# Patient Record
Sex: Male | Born: 1960 | Race: White | Hispanic: No | Marital: Married | Smoking: Never smoker
Health system: Southern US, Community
[De-identification: ages and names within clinical notes are randomized; demographics above are authoritative.]

## PROBLEM LIST (undated history)

## (undated) DIAGNOSIS — R569 Unspecified convulsions: Secondary | ICD-10-CM

## (undated) DIAGNOSIS — N189 Chronic kidney disease, unspecified: Secondary | ICD-10-CM

## (undated) DIAGNOSIS — M109 Gout, unspecified: Secondary | ICD-10-CM

## (undated) DIAGNOSIS — N200 Calculus of kidney: Secondary | ICD-10-CM

## (undated) DIAGNOSIS — E119 Type 2 diabetes mellitus without complications: Secondary | ICD-10-CM

## (undated) DIAGNOSIS — I2510 Atherosclerotic heart disease of native coronary artery without angina pectoris: Secondary | ICD-10-CM

## (undated) DIAGNOSIS — E669 Obesity, unspecified: Secondary | ICD-10-CM

## (undated) DIAGNOSIS — K219 Gastro-esophageal reflux disease without esophagitis: Secondary | ICD-10-CM

## (undated) DIAGNOSIS — K5790 Diverticulosis of intestine, part unspecified, without perforation or abscess without bleeding: Secondary | ICD-10-CM

## (undated) DIAGNOSIS — I219 Acute myocardial infarction, unspecified: Secondary | ICD-10-CM

## (undated) HISTORY — PX: BACK SURGERY: SHX140

## (undated) HISTORY — PX: APPENDECTOMY: SHX54

## (undated) HISTORY — PX: OTHER SURGICAL HISTORY: SHX169

## (undated) HISTORY — PX: PARTIAL COLECTOMY: SHX5273

## (undated) HISTORY — PX: CARDIAC CATHETERIZATION: SHX172

---

## 1999-07-23 ENCOUNTER — Emergency Department (HOSPITAL_COMMUNITY): Admission: EM | Admit: 1999-07-23 | Discharge: 1999-07-23 | Payer: Self-pay | Admitting: Emergency Medicine

## 2000-06-21 ENCOUNTER — Encounter: Payer: Self-pay | Admitting: Neurosurgery

## 2000-06-21 ENCOUNTER — Ambulatory Visit (HOSPITAL_COMMUNITY): Admission: RE | Admit: 2000-06-21 | Discharge: 2000-06-21 | Payer: Self-pay | Admitting: Neurosurgery

## 2000-07-05 ENCOUNTER — Ambulatory Visit (HOSPITAL_COMMUNITY): Admission: RE | Admit: 2000-07-05 | Discharge: 2000-07-05 | Payer: Self-pay | Admitting: Neurosurgery

## 2000-07-05 ENCOUNTER — Encounter: Payer: Self-pay | Admitting: Neurosurgery

## 2000-07-19 ENCOUNTER — Ambulatory Visit (HOSPITAL_COMMUNITY): Admission: RE | Admit: 2000-07-19 | Discharge: 2000-07-19 | Payer: Self-pay | Admitting: Neurosurgery

## 2000-07-19 ENCOUNTER — Encounter: Payer: Self-pay | Admitting: Neurosurgery

## 2000-09-13 ENCOUNTER — Ambulatory Visit (HOSPITAL_COMMUNITY): Admission: RE | Admit: 2000-09-13 | Discharge: 2000-09-14 | Payer: Self-pay | Admitting: Neurosurgery

## 2000-09-13 ENCOUNTER — Encounter: Payer: Self-pay | Admitting: Neurosurgery

## 2001-10-11 HISTORY — PX: COLONOSCOPY: SHX174

## 2001-11-23 ENCOUNTER — Encounter: Payer: Self-pay | Admitting: Emergency Medicine

## 2001-11-23 ENCOUNTER — Emergency Department (HOSPITAL_COMMUNITY): Admission: EM | Admit: 2001-11-23 | Discharge: 2001-11-23 | Payer: Self-pay | Admitting: Emergency Medicine

## 2002-03-21 ENCOUNTER — Encounter: Payer: Self-pay | Admitting: Internal Medicine

## 2002-03-21 ENCOUNTER — Emergency Department (HOSPITAL_COMMUNITY): Admission: EM | Admit: 2002-03-21 | Discharge: 2002-03-21 | Payer: Self-pay | Admitting: Internal Medicine

## 2002-05-16 ENCOUNTER — Ambulatory Visit (HOSPITAL_COMMUNITY): Admission: RE | Admit: 2002-05-16 | Discharge: 2002-05-16 | Payer: Self-pay | Admitting: Internal Medicine

## 2002-05-17 ENCOUNTER — Ambulatory Visit (HOSPITAL_COMMUNITY): Admission: RE | Admit: 2002-05-17 | Discharge: 2002-05-17 | Payer: Self-pay | Admitting: Internal Medicine

## 2002-05-17 ENCOUNTER — Encounter: Payer: Self-pay | Admitting: Internal Medicine

## 2002-05-18 ENCOUNTER — Inpatient Hospital Stay (HOSPITAL_COMMUNITY): Admission: AD | Admit: 2002-05-18 | Discharge: 2002-05-22 | Payer: Self-pay | Admitting: General Surgery

## 2002-05-19 ENCOUNTER — Encounter: Payer: Self-pay | Admitting: General Surgery

## 2002-06-04 ENCOUNTER — Other Ambulatory Visit: Admission: RE | Admit: 2002-06-04 | Discharge: 2002-06-04 | Payer: Self-pay | Admitting: General Surgery

## 2002-06-05 ENCOUNTER — Inpatient Hospital Stay (HOSPITAL_COMMUNITY): Admission: AD | Admit: 2002-06-05 | Discharge: 2002-06-13 | Payer: Self-pay | Admitting: General Surgery

## 2002-12-03 ENCOUNTER — Observation Stay (HOSPITAL_COMMUNITY): Admission: EM | Admit: 2002-12-03 | Discharge: 2002-12-04 | Payer: Self-pay | Admitting: Cardiovascular Disease

## 2002-12-13 ENCOUNTER — Encounter: Payer: Self-pay | Admitting: Internal Medicine

## 2002-12-13 ENCOUNTER — Ambulatory Visit (HOSPITAL_COMMUNITY): Admission: RE | Admit: 2002-12-13 | Discharge: 2002-12-13 | Payer: Self-pay | Admitting: Internal Medicine

## 2003-01-22 ENCOUNTER — Emergency Department (HOSPITAL_COMMUNITY): Admission: EM | Admit: 2003-01-22 | Discharge: 2003-01-22 | Payer: Self-pay | Admitting: Emergency Medicine

## 2003-12-22 ENCOUNTER — Emergency Department (HOSPITAL_COMMUNITY): Admission: EM | Admit: 2003-12-22 | Discharge: 2003-12-22 | Payer: Self-pay | Admitting: Emergency Medicine

## 2004-05-21 ENCOUNTER — Emergency Department (HOSPITAL_COMMUNITY): Admission: EM | Admit: 2004-05-21 | Discharge: 2004-05-22 | Payer: Self-pay | Admitting: Emergency Medicine

## 2004-07-23 ENCOUNTER — Emergency Department (HOSPITAL_COMMUNITY): Admission: EM | Admit: 2004-07-23 | Discharge: 2004-07-23 | Payer: Self-pay | Admitting: Emergency Medicine

## 2004-10-12 ENCOUNTER — Emergency Department (HOSPITAL_COMMUNITY): Admission: EM | Admit: 2004-10-12 | Discharge: 2004-10-12 | Payer: Self-pay | Admitting: Emergency Medicine

## 2005-06-07 ENCOUNTER — Emergency Department (HOSPITAL_COMMUNITY): Admission: EM | Admit: 2005-06-07 | Discharge: 2005-06-07 | Payer: Self-pay | Admitting: Emergency Medicine

## 2005-08-17 ENCOUNTER — Emergency Department (HOSPITAL_COMMUNITY): Admission: EM | Admit: 2005-08-17 | Discharge: 2005-08-17 | Payer: Self-pay | Admitting: Emergency Medicine

## 2005-09-26 ENCOUNTER — Emergency Department (HOSPITAL_COMMUNITY): Admission: EM | Admit: 2005-09-26 | Discharge: 2005-09-26 | Payer: Self-pay | Admitting: Emergency Medicine

## 2005-10-13 ENCOUNTER — Emergency Department (HOSPITAL_COMMUNITY): Admission: EM | Admit: 2005-10-13 | Discharge: 2005-10-13 | Payer: Self-pay | Admitting: Emergency Medicine

## 2005-11-07 ENCOUNTER — Emergency Department (HOSPITAL_COMMUNITY): Admission: EM | Admit: 2005-11-07 | Discharge: 2005-11-07 | Payer: Self-pay | Admitting: Emergency Medicine

## 2006-02-24 ENCOUNTER — Emergency Department (HOSPITAL_COMMUNITY): Admission: EM | Admit: 2006-02-24 | Discharge: 2006-02-24 | Payer: Self-pay | Admitting: Emergency Medicine

## 2006-02-26 ENCOUNTER — Emergency Department (HOSPITAL_COMMUNITY): Admission: EM | Admit: 2006-02-26 | Discharge: 2006-02-26 | Payer: Self-pay | Admitting: Emergency Medicine

## 2006-04-04 ENCOUNTER — Emergency Department (HOSPITAL_COMMUNITY): Admission: EM | Admit: 2006-04-04 | Discharge: 2006-04-04 | Payer: Self-pay | Admitting: Emergency Medicine

## 2006-05-14 ENCOUNTER — Emergency Department (HOSPITAL_COMMUNITY): Admission: EM | Admit: 2006-05-14 | Discharge: 2006-05-14 | Payer: Self-pay | Admitting: Internal Medicine

## 2006-06-15 ENCOUNTER — Emergency Department (HOSPITAL_COMMUNITY): Admission: EM | Admit: 2006-06-15 | Discharge: 2006-06-15 | Payer: Self-pay | Admitting: Emergency Medicine

## 2006-08-07 ENCOUNTER — Emergency Department (HOSPITAL_COMMUNITY): Admission: EM | Admit: 2006-08-07 | Discharge: 2006-08-07 | Payer: Self-pay | Admitting: Emergency Medicine

## 2006-10-12 ENCOUNTER — Emergency Department (HOSPITAL_COMMUNITY): Admission: EM | Admit: 2006-10-12 | Discharge: 2006-10-12 | Payer: Self-pay | Admitting: Emergency Medicine

## 2007-09-22 ENCOUNTER — Emergency Department (HOSPITAL_COMMUNITY): Admission: EM | Admit: 2007-09-22 | Discharge: 2007-09-22 | Payer: Self-pay | Admitting: *Deleted

## 2008-02-11 ENCOUNTER — Emergency Department (HOSPITAL_COMMUNITY): Admission: EM | Admit: 2008-02-11 | Discharge: 2008-02-11 | Payer: Self-pay | Admitting: Emergency Medicine

## 2008-02-16 ENCOUNTER — Emergency Department (HOSPITAL_COMMUNITY): Admission: EM | Admit: 2008-02-16 | Discharge: 2008-02-16 | Payer: Self-pay | Admitting: Emergency Medicine

## 2008-03-12 ENCOUNTER — Emergency Department (HOSPITAL_COMMUNITY): Admission: EM | Admit: 2008-03-12 | Discharge: 2008-03-12 | Payer: Self-pay | Admitting: Emergency Medicine

## 2008-03-13 ENCOUNTER — Emergency Department (HOSPITAL_COMMUNITY): Admission: EM | Admit: 2008-03-13 | Discharge: 2008-03-13 | Payer: Self-pay | Admitting: Emergency Medicine

## 2008-03-13 ENCOUNTER — Encounter: Payer: Self-pay | Admitting: Orthopedic Surgery

## 2008-03-19 ENCOUNTER — Ambulatory Visit: Payer: Self-pay | Admitting: Orthopedic Surgery

## 2008-03-19 DIAGNOSIS — M25579 Pain in unspecified ankle and joints of unspecified foot: Secondary | ICD-10-CM

## 2008-07-14 ENCOUNTER — Emergency Department (HOSPITAL_COMMUNITY): Admission: EM | Admit: 2008-07-14 | Discharge: 2008-07-15 | Payer: Self-pay | Admitting: Emergency Medicine

## 2008-09-05 ENCOUNTER — Emergency Department (HOSPITAL_COMMUNITY): Admission: EM | Admit: 2008-09-05 | Discharge: 2008-09-05 | Payer: Self-pay | Admitting: Emergency Medicine

## 2008-10-13 ENCOUNTER — Emergency Department (HOSPITAL_COMMUNITY): Admission: EM | Admit: 2008-10-13 | Discharge: 2008-10-13 | Payer: Self-pay | Admitting: Emergency Medicine

## 2008-10-23 ENCOUNTER — Emergency Department (HOSPITAL_COMMUNITY): Admission: EM | Admit: 2008-10-23 | Discharge: 2008-10-23 | Payer: Self-pay | Admitting: Emergency Medicine

## 2009-03-10 ENCOUNTER — Emergency Department (HOSPITAL_COMMUNITY): Admission: EM | Admit: 2009-03-10 | Discharge: 2009-03-10 | Payer: Self-pay | Admitting: Emergency Medicine

## 2009-10-25 ENCOUNTER — Emergency Department (HOSPITAL_COMMUNITY): Admission: EM | Admit: 2009-10-25 | Discharge: 2009-10-25 | Payer: Self-pay | Admitting: Emergency Medicine

## 2010-01-15 ENCOUNTER — Emergency Department (HOSPITAL_COMMUNITY): Admission: EM | Admit: 2010-01-15 | Discharge: 2010-01-16 | Payer: Self-pay | Admitting: Emergency Medicine

## 2010-09-30 ENCOUNTER — Emergency Department (HOSPITAL_COMMUNITY)
Admission: EM | Admit: 2010-09-30 | Discharge: 2010-09-30 | Payer: Self-pay | Source: Home / Self Care | Admitting: Emergency Medicine

## 2010-12-17 ENCOUNTER — Emergency Department (HOSPITAL_COMMUNITY)
Admission: EM | Admit: 2010-12-17 | Discharge: 2010-12-18 | Disposition: A | Discharge status: Home / Self Care | Payer: BC Managed Care – PPO | Attending: Emergency Medicine | Admitting: Emergency Medicine

## 2010-12-17 DIAGNOSIS — G40909 Epilepsy, unspecified, not intractable, without status epilepticus: Secondary | ICD-10-CM | POA: Insufficient documentation

## 2010-12-17 DIAGNOSIS — Z9889 Other specified postprocedural states: Secondary | ICD-10-CM | POA: Insufficient documentation

## 2010-12-17 DIAGNOSIS — M109 Gout, unspecified: Secondary | ICD-10-CM | POA: Insufficient documentation

## 2010-12-17 DIAGNOSIS — R109 Unspecified abdominal pain: Secondary | ICD-10-CM | POA: Insufficient documentation

## 2010-12-17 DIAGNOSIS — I252 Old myocardial infarction: Secondary | ICD-10-CM | POA: Insufficient documentation

## 2010-12-17 LAB — COMPREHENSIVE METABOLIC PANEL
AST: 17 U/L (ref 0–37)
Albumin: 3.5 g/dL (ref 3.5–5.2)
Alkaline Phosphatase: 56 U/L (ref 39–117)
CO2: 26 mEq/L (ref 19–32)
Chloride: 106 mEq/L (ref 96–112)
Creatinine, Ser: 0.8 mg/dL (ref 0.4–1.5)
GFR calc non Af Amer: >60 mL/min (ref >60)
Potassium: 3.8 mEq/L (ref 3.5–5.1)
Total Bilirubin: 0.4 mg/dL (ref 0.3–1.2)

## 2010-12-17 LAB — LIPASE, BLOOD: Lipase: 27 U/L (ref 11–59)

## 2011-02-26 NOTE — Discharge Summary (Signed)
John Wallace, John Wallace                         ACCOUNT NO.:  [id number]   MEDICAL RECORD NO.:  [id number]                   PATIENT TYPE:  INP   LOCATION:  A307                                 FACILITY:  [facility]   PHYSICIAN:  Marlane Hatcher, M.D.           DATE OF BIRTH:  04-Apr-1961   DATE OF ADMISSION:  05/18/2002  DATE OF DISCHARGE:  05/22/2002                                 DISCHARGE SUMMARY   DIAGNOSIS:  Acute diverticulitis.   SECONDARY DIAGNOSIS:  Gout.   HISTORY OF PRESENT ILLNESS:  This is a 50 year old white male who was  admitted with left lower quadrant pain that had been recurring on and off  for about a month.  This was his second episode of diverticulitis in  essence.  He had recently undergone a colonoscopy, where he was noted to  have a narrowing of the sigmoid colon with marked inflammatory changes noted  and recurrent diverticulitis was expected.  The possibility of carcinoma of  the colon could not be ruled out.  He was admitted to my service and was  treated with antibiotics and the patient did well.  His abdominal pain  subsided and at the time of discharge four days later he was tolerating p.o.  well.  He was able to void and move his bowels without discomfort.   The plan is for him to let this diverticulitis further acquiesce, reevaluate  this, and plan for elective surgery, as this patient has had several bouts  of acute diverticulitis.   LABORATORY DATA:  The patient was admitted with a white count of 8.9 with an  H&H of 14.6 and 42.9, 77 neutrophils were noted.  His sed rate was 47.  This  was elevated.  His liver function studies and electrolytes were within  normal limits.  His CEA level was not elevated.  It was less than 0.5.   Acute abdominal series showed retained barium and the colon and it was  related to the patient's CT scan and otherwise appeared to be a normal gas  pattern.   HOSPITAL COURSE:  Hospital course essentially unremarkable.   The patient's  severe left lower quadrant pain subsided and he was feeling much better  after the initiation of antibiotics and he was discharged on the fourth  postoperative day with followup arrangements planned to likely involve an  elective sigmoid resection.   DISCHARGE INSTRUCTIONS:  The patient was discharged on a full-liquid and  soft diet.  He was told to refrain from eating nuts, seeds, or popcorn.  He  is to increase his activities as tolerated.  He is permitted to shower and  shampoo.  He is told to do no heavy lifting.   DISCHARGE MEDICATIONS:  1. Cipro 500 mg p.o. b.i.d.  2. Flagyl 500 mg p.o. t.i.d.    FOLLOW UP:  He is to follow up for any medical problems in the interim that  he may have with Dr. Felecia Shelling.  I have made arrangements to see him in followup  and we will plan for an elective sigmoid resection at a later date.                                               Marlane Hatcher, M.D.    WSB/MEDQ  D:  07/02/2002  T:  07/03/2002  Job:  [id number]   cc:   Tesfaye D. Felecia Shelling, M.D.   Gerrit Friends. Rourk, M.D.

## 2011-02-26 NOTE — Op Note (Signed)
NAME:  John Wallace, John Wallace                         ACCOUNT NO.:  [id number]   MEDICAL RECORD NO.:  [id number]                   PATIENT TYPE:  AMB   LOCATION:  DAY                                  FACILITY:  [facility]   PHYSICIAN:  Jennie Bolar. Rourk, M.D.               DATE OF BIRTH:  1961-02-28   DATE OF PROCEDURE:  05/16/2002  DATE OF DISCHARGE:  05/16/2002                                 OPERATIVE REPORT   PROCEDURE:  Attempted colonoscopy.   ENDOSCOPIST:  Gerrit Friends. Rourk, M.D.   INDICATIONS FOR PROCEDURE:  The patient is a 50 year old gentleman with a  recent abdominal pain.  CT suggestive of thickening of the left colon and  inflammatory change consistent with diverticulitis.  This patient's symptoms  have improved greatly over the past several weeks.  He started having  recurrent cramping last evening with the prep.  He has not had any fever or  chills. His abdomen is minimally, diffusely tender to palpation this  morning.  Colonoscopy is now being done to further evaluate his symptoms and  the recent abnormal CT.  This approach has been discussed with the patient.  The potential risks, benefits, and alternatives have been reviewed, and  questions answered.  Please see my dictated consultation note of 03/21/02 for  more information.   DESCRIPTION OF PROCEDURE:  The patient was placed in the left lateral  decubitus position.  O2 saturation, blood pressure, pulse and respirations  were monitored throughout the entire procedure.   CONSCIOUS SEDATION:  Versed 5 mg IV, Demerol 125 mg IV in divided doses.   INSTRUMENT:  Olympus video chip adult colonoscope and subsequently pediatric  colonoscope.   FINDINGS:  Digital rectal exam revealed no abnormalities.   ENDOSCOPIC FINDINGS:  The prep was good.   RECTUM:  Examination of the rectal mucosa including the retroflex view of  the anal verge revealed no abnormalities.   COLON:  The colonic mucosa was surveyed from the rectosigmoid  junction to  approximately 35-40 cm.  The mucosa appeared normal to 35-40 cm.  The  upstream lumen would not open up for me.  It was a bit off center and I  attempted to advance it further to edge the nose of the scope up into the  lumen; however, this produced extreme discomfort for the patient.   Even with minimal attempts at gentle pressure the colon appeared swollen,  although the mucosa itself appeared normal. I tried to advance the scope  numerous times with the adult scope.  I subsequently pulled the adult scope  out and obtained a pediatric colonoscope and advanced it easily to the area  in question at 35-40 cm.  Again, I encountered difficulties with the lumen  which was significantly narrowed and off centered.  I was not able to  advance the scope upstream sufficiently to gain access into this narrowed  segment.  The patient was  quite uncomfortable with every attempt.   I subsequently decided not to make further attempts and withdrew the scope.  The colon and rectal mucosa of the distal 35 cm. Appeared entirely normal.  The patient was returned to the recovery room in stable condition.   IMPRESSION:  1. Normal rectum.  2. Normal appearing colonic mucosa to 35-40 cm.  Spasm versus persistent     swelling and noncompliance of the colon along with patient's discomfort     precluded completion of the examination.   I suspect the patient has either smoldering diverticulitis or adhesive  disease secondary to a prior bout of diverticulitis.   RECOMMENDATIONS:  Will check a CBC and a sedimentation rate today.  Will  decide about pursuing a repeat CT scan versus a BE in the very near future.  Further recommendations to follow.                                               Gerrit Friends. Rourk, M.D.    RMR/MEDQ  D:  05/16/2002  T:  05/19/2002  Job:  [id number]   cc:   Madelin Rear. Sherwood Gambler, M.D.  [address]  [place]  [place] [address]  Fax: [phone]

## 2011-02-26 NOTE — Discharge Summary (Signed)
NAME:  John Wallace, John Wallace                         ACCOUNT NO.:  [id number]   MEDICAL RECORD NO.:  [id number]                   PATIENT TYPE:  INP   LOCATION:  3729                                 FACILITY:  [facility]   PHYSICIAN:  Charlton Haws, M.D. [facility]              DATE OF BIRTH:  10-06-1961   DATE OF ADMISSION:  12/03/2002  DATE OF DISCHARGE:  12/04/2002                           DISCHARGE SUMMARY - REFERRING   HISTORY:  John Wallace is a 50 year old white male who developed the sudden  onset of chest discomfort that he describes as gas in the left upper chest  lasting 5-6 minutes, would then go away, and then come back.  This did not  radiate.  He said this began three days prior to admission, and he had an  episode at [facility] which was severe enough that it convinced him to come to  the emergency room.  He describes this as gas, he will get a rapid heart  beat, and he feels like his discomfort increases with each heartbeat.  He  feels tingling all over, weak, trembly, and has broken out in cold sweats.  He states that it would just ease off on its own.  He was admitted to  [facility] for evaluation.  His history is notable for mild  hyperlipidemia status post partial colectomy for severe diverticular disease  with surgery by Dr. Malvin Johns, to be followed by Dr. Elesa Hacker, and multiple  back surgeries.   LABORATORY DATA:  On transfer, hemoglobin and hematocrit was 15.7 and 45.9,  platelets 218, WBCs 8.4.  Total cholesterol was 122, triglycerides 110, HDL  37, LDL 63.  CRP was low risk at 0.498.   HOSPITAL COURSE:  John Wallace was transferred from [facility] on  12/03/02 to undergo cardiac catheterization to evaluate his chest discomfort.  Cardiac catheterization was performed on 12/04/02 by Dr. Juanda Chance.  This showed  some irregularities in the RCA.  His left ventricular function was 60%.  Dr.  Juanda Chance felt that he did not have any significant coronary artery disease.  He reassured  the patient and felt that he could be discharged home post bed  rest and prescribed with a proton pump inhibitor, aspirin, statin, and  follow up with Dr. Felecia Shelling.  Post bed rest, the patient was ambulating in the  halls without difficulty, catheterization site was intact, thus he was  discharged home.   DISCHARGE DIAGNOSES:  1. Noncardiac chest discomfort.  2. Hyperlipidemia with a low HDL.  3. History as previously of tobacco use.   DISPOSITION:  He is discharged home.  Received new prescriptions for  Protonix 30 mg daily, Zocor 20 mg q.h.s. one month supply with one refill,  and he was asked to begin a baby aspirin 81 mg daily.  He was advised no  lifting, driving, sexual activity, or heavy exertion for two days.  He is to  maintain a low-salt, fat,  and cholesterol diet.  If he has any problems with  his catheterization site, he was asked to call us immediately.  He was  advised no smoking or tobacco products, and the plan is for a 1-2 week  followup appointment with Dr. Felecia Shelling.  At that appointment, consideration  should be given to monitoring improvement of symptoms on a proton pump  inhibitor, but if these  have not helped the symptoms, he ought to consider another GI evaluation.  With the initiation of Zocor, he should have fasting lipids and LFTs in  approximately six weeks.  He was encouraged to begin an exercise program and  weight loss program (i.e. recumbent bicycle with his back problems).     Joellyn Rued, P.A. [facility]                    Charlton Haws, M.D. [facility]    EW/MEDQ  D:  12/04/2002  T:  12/04/2002  Job:  [id number]   cc:   Ninetta Lights D. Felecia Shelling, M.D.  [address]  [place]  [place] [address]  Fax: [phone]   Dr. Diona Browner, [facility]  [address], [address]  [place], [place]  [address]

## 2011-02-26 NOTE — Discharge Summary (Signed)
NAME:  John Wallace, John Wallace                         ACCOUNT NO.:  [id number]   MEDICAL RECORD NO.:  [id number]                   PATIENT TYPE:   LOCATION:                                       FACILITY:  [facility]   PHYSICIAN:  Barbaraann Barthel, M.D.              DATE OF BIRTH:  Feb 01, 1961   DATE OF ADMISSION:  06/05/2002  DATE OF DISCHARGE:  06/13/2002                                 DISCHARGE SUMMARY   DIAGNOSES:  Diverticular disease.   PROCEDURE:  On July 07, 2002 sigmoid resection with side to side  stapled anastomosis.   SECONDARY DIAGNOSES:  Gout.   HISTORY:  This is a 50 year old male who had recurrent episodes of left  lower quadrant pain.  He had two prior episodes of diverticulitis.  He was  admitted after being treated with antibiotics and with plan for a sigmoid  resection is discussed in detail with the patient preoperatively.  This took  place on June 05, 2002 where his parenteral antibiotic and mechanical prep  were accomplished and he was taken to surgery on June 06, 2002 where  sigmoid resection was carried out uneventfully.  His postoperative course  was unremarkable except that he did have an episode postoperatively of gout.  This was treated with the appropriate anti-inflammatory medications without  any problem.  Postoperatively he did well.  His diet and activity were  advanced as tolerated.  At time of discharge he was tolerating p.o. well.  His wound was clean and he was moving his bowels without any problem.  He  was afebrile without any signs of any infection or problems with his  surgery.   LABORATORY DATA:  Pathology report revealed diverticulitis and areas without  any sign of any malignancy.  On June 07, 2002 his white count was 10,000  with an H&H of 13.5 and 39.3.  His metabolic 7 was within normal limits as  was his liver function studies.  On June 09, 2002 his white count was down  to 8.8 with an H&H of 13.5 and 39.7.  As stated, his  hospital course was  completely uneventful and he was discharged on June 13, 2002.   DISCHARGE INSTRUCTIONS:  He was excused from work.  He was discharged on  full liquid to soft diet.  He is permitted to shower.  Tub bath and shampoo  his hair.  He is told to increase his activity as tolerated.  Avoid any  heavy lifting or driving or sexual activity until told to do so.  He is to  take Colace 100 mg p.o. q.d. or b.i.d. or q.o.d.  depending on his bowels.  He is told to resume his gout medicine and  allopurinol 100 mg tablet prescription was given for him to take daily.  Take Tylenol one tablet p.o. q.4h. p.r.n. pain.  Clean his wound with  alcohol t.i.d.  I will follow up with him  perioperatively after which he is  to return to Dr. Felecia Shelling for any medical problems in the future.                                               Barbaraann Barthel, M.D.    WB/MEDQ  D:  08/20/2002  T:  08/21/2002  Job:  [id number]   cc:   Tesfaye D. Felecia Shelling, M.D.  [address]  [place]  [place] [address]  Fax: [phone]

## 2011-02-26 NOTE — Op Note (Signed)
NAME:  John Wallace, John Wallace                         ACCOUNT NO.:  [id number]   MEDICAL RECORD NO.:  [id number]                   PATIENT TYPE:  INP   LOCATION:  A306                                 FACILITY:  [facility]   PHYSICIAN:  Marlane Hatcher, M.D.           DATE OF BIRTH:  [birth year]   DATE OF PROCEDURE:  06/06/2002  DATE OF DISCHARGE:                                 OPERATIVE REPORT   PREOPERATIVE DIAGNOSIS:  Diverticulosis coli.   POSTOPERATIVE DIAGNOSIS:  Diverticulosis coli.   PROCEDURE:  Sigmoid resection.   SURGEON:  Marlane Hatcher, M.D.   INDICATIONS:  This is a 50 year old white male who had recurrent episodes of  diverticular disease.  He had three documented cases of diverticular disease  requiring, on the last episode hospitalization.  Colonoscopy revealed a  segment of stenosed, inflamed, sigmoid colon about 35-40 cm from the anal  verge.  A CT scan revealed a very inflamed segment of colon as well with  some air along the sides of the bowel.  The patient was treated with  antibiotics and 3 weeks later he was taken to surgery after discussing the  need for elective sigmoid resection to him.  We discussed the complications  not limited to but including infection, infection and anastomotic leaks.  Informed consent was obtained.   GROSS OPERATIVE FINDINGS:  The patient had an above the sacral promontory,  at approximately the lower portion of the sigmoid colon an adhered area of  inflamed diverticular disease that was actually adherent to the bladder; and  would like, in time with no doubt in my mind, cause a colovesical fistula.  The rest of the colon was palpated and was normal.  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  No other abnormalities were  encountered preoperatively.   TECHNIQUE:  The patient was placed in the supine position and after the  adequate administration of general anesthesia by endotracheal intubation his  entire  abdomen was prepped with Betadine solution and draped in the usual  manner.  A Foley catheter was aseptically inserted and the G-tube was  placed. A midline incision was carried out from the symphysis pubis to above  the umbilicus.  A self-retaining retractor was placed and the abdomen was  explored with the above findings.  We dissected free the inflamed area of  colon from the bladder. Although this was densely adhered there was no  actual fistula present. This was then dissected free from the bladder.  It  was divided in order to avoid any spillage using a GIA stapler proximally  and distally to the area of inflamed colon which comprised approximately a  segment of 8-10 inches of colon.   This was removed.  We then sent this for frozen section which did not reveal  any evidence of malignancy, but an inflammatory process.  We then irrigated  the abdominal cavity and then performed  a staple side-to-side anastomosis  using the GIA stapler and the PA-55 stapler to close the anastomosis.  The  anastomosis was oversewn with #3-0 GI silk, buttressing the staple line also  with fat pads.  We repaired the mesentery with monofilament absorbable  suture and then irrigated and changed gloves and then checked for hemostasis  and then closed the abdominal cavity with a running #1 Prolene suture.  The  subcu was irrigated.  The skin was approximated with stapling device.  Prior  to  closure all sponge, needle, and instrument counts were found to be correct.  Estimated blood loss was approximately 100-150 cc.  The patient received  2500 of crystalloids intraoperatively.  No drains were placed and the were  no complications.                                               Marlane Hatcher, M.D.    WSB/MEDQ  D:  06/06/2002  T:  06/07/2002  Job:  [id number]   cc:   Tesfaye D. Felecia Shelling, M.D.   Gerrit Friends. Rourk, M.D.

## 2011-02-26 NOTE — H&P (Signed)
NAME:  John Wallace, John Wallace                         ACCOUNT NO.:  [id number]   MEDICAL RECORD NO.:  [id number]                   PATIENT TYPE:  INP   LOCATION:  A306                                 FACILITY:  [facility]   PHYSICIAN:  Marlane Hatcher, M.D.           DATE OF BIRTH:  21-Sep-1961   DATE OF ADMISSION:  06/05/2002  DATE OF DISCHARGE:                                HISTORY & PHYSICAL   HISTORY OF PRESENT ILLNESS:  This is a 50 year old white male who has had  recurrent episodes of left lower quadrant pain.  He has had essentially two  prior episodes prior to his past admission for this problem, and in essence  this composing at least three prior episodes of diverticulitis.  He was  admitted in the early part of August, May 18, 2002, for diverticulitis.  He was treated with antibiotics.  A CT scan revealed an area of localized  sigmoid inflammation.  He was also seen by the gastroenterologist who  performed a colonoscopy on him and referred him to general surgery for  elective resection.  The patient's laboratory work was otherwise within  normal limits and the CEA level was not elevated.   After treating him with antibiotic therapy and his symptoms subsided, we had  plans for an elective sigmoid resection.  He is now admitted to the hospital  to complete his bowel prep and antibiotic prep in preparation for surgery in  the morning.   PHYSICAL EXAMINATION:  GENERAL:  The patient is in no acute distress.  He is  5 feet 8 inches, weighs 224 pounds.  His blood pressure is 129/77, heart  rate 60 per minute, and temperature is 96 degrees.  HEAD:  Normocephalic.  Eyes:  Extraocular movements are intact.  Pupils are  round and react to light and accomodation.  There is no conjunctive pallor  or scleral injection.  Sclera is of normal tincture.  CHEST:  Clear to both anterior and posterior auscultation.  HEART:  Regular in rhythm.  ABDOMEN:  Nontender.  Bowel sounds are normoactive.   He has no femoral or  inguinal hernias.  Rectal examination was not repeated, as this patient has  undergone colonoscopy.  When he was last seen, he had guaiac-negative blood  when I was examining him in my office.  EXTREMITIES:  Grossly within normal limits.   PAST MEDICAL HISTORY:  The patient has problems with recurrent gout from  time to time for which he takes Indocin on a p.r.n. basis.   PAST SURGICAL HISTORY:  He has had an appendectomy in the past.  He has had  a cervical lymph node biopsy for benign disease in the past.  He has had  lumbar disk surgery x5 in the past.   ALLERGIES:  He has no known allergies.   MEDICATIONS:  Apart from the Indocin, he takes no medications on a regular  basis.   REVIEW  OF SYSTEMS:  GI:  The patient has had recurrent left lower quadrant  pain.  He has had three prior episodes of diverticulitis.  He has no past  history of colon cancer, and he has undergone a colonoscopy by Dr. Jena Gauss in  the past.  He has no history of unexplained weight loss and as stated he had  a CT scan which showed an area of localized inflammation in the sigmoid  colon.  GU SYSTEM:  No history of dysuria or hematuria, although at times  when he has had bouts of diverticulitis he has had a feeling of fullness in  the suprapubic area.  No past history of nephrolithiasis.  ENDOCRINE SYSTEM:  The patient is not diabetic.  No history of thyroid disease.  CARDIOVASCULAR  SYSTEM:  Grossly within normal limits.   IMPRESSION:  Recurrent episodes of diverticulitis.   PLAN:  He will undergo a bowel prep and an antibiotic prep, and we will plan  for an elective sigmoid resection during this hospitalization.  This was  discussed in full with both the patient and his wife.  We discussed  complications, not limited to but including bleeding, infection, and the  possibility of an anastomotic leak, and informed consent was obtained.  I  will inform Dr. Jena Gauss and Dr. Felecia Shelling of his  admission.                                                Marlane Hatcher, M.D.    WSB/MEDQ  D:  06/05/2002  T:  06/05/2002  Job:  [id number]   cc:   Tesfaye D. Felecia Shelling, M.D.   Gerrit Friends. Rourk, M.D.

## 2011-02-26 NOTE — Cardiovascular Report (Signed)
NAME:  John Wallace, John Wallace                         ACCOUNT NO.:  [id number]   MEDICAL RECORD NO.:  [id number]                   PATIENT TYPE:  INP   LOCATION:  3729                                 FACILITY:  [facility]   PHYSICIAN:  Charlies Constable, M.D. 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  He has hyperlipidemia.  He was seen for evaluation  of chest pain by Dr. Diona Browner.  He had an episode at [facility] which was more  severe and prompted him to come to [facility] Emergency Room where he was seen  by Dr. Diona Browner.  He was admitted and transferred to [facility].  His  troponins were negative to borderline.   DESCRIPTION OF PROCEDURE:  The procedure was performed via the right femoral  artery using an arterial sheath and 6-French preformed coronary catheters.  A front-wall arterial puncture was performed, and Omnipaque contrast was  used.  The right femoral artery was closed with the Matrix closure device at  the end of the procedure.  The patient tolerated the procedure well and left  the laboratory in satisfactory condition.   RESULTS:  1. Left main coronary artery:  The left main coronary artery was free of     significant disease.  2. Left anterior descending artery:  The left anterior descending artery     gave rise to a septal perforator and a diagonal branch.  These, and the     LAD proper, were free of significant disease.  3. Left circumflex artery:  The left circumflex artery gave rise to an     intermedius branch, 2 marginal branches, an atrial branch, and a     posterolateral branch.  These vessels were free of significant disease.  4. Right coronary artery:  The right coronary artery was a moderate-sized     vessel that gave rise to a right ventricular  branch, a posterior     descending branch, and a small posterolateral branch.  The right coronary     artery was irregular, but there was no major obstruction.  5. Left ventriculogram:  The left ventriculogram, performed in the RAO     projection, showed good wall motion with no areas of hypokinesis.  The     estimated ejection fraction was 60.   IMPRESSION:  Normal coronary angiography, except for minimal irregularities  in the right coronary artery, and normal left ventricular function.    RECOMMENDATIONS:  Reassurance.  In view of these findings, I think the  patient's symptoms are not likely related to ischemic heart disease.  They  may be related to reflux, and we will treat him for this empirically.  I  spoke with  Dr. Felecia Shelling and will ask the patient to follow up with him.  He  should have prophylactic treatment with aspirin and a statin.                                               Charlies Constable, M.D. [facility]    BB/MEDQ  D:  12/04/2002  T:  12/04/2002  Job:  [id number]   cc:   Ninetta Lights D. Felecia Shelling, M.D.  [address]  [place]  [place] [address]  Fax: [phone]   Jonelle Sidle, M.D. [facility]   Cardiac Catheterization Lab

## 2011-02-26 NOTE — Consult Note (Signed)
[facility]  Patient:    John Wallace, John Wallace Visit Number: [id number] MRN: [id number]          Service Type: EMS Location: ED Attending Physician:  Cassell Smiles. Dictated by:   Gardiner Coins, P.A. Proc. Date: 04/17/02 Admit Date:  03/21/2002 Discharge Date: 03/21/2002   CC:         Avon Gully, M.D.   Consultation Report  DATE OF BIRTH:  1961/06/15  REFERRING PHYSICIAN:  Dr. Avon Gully.  CHIEF COMPLAINT:  Diverticulitis.  HISTORY OF PRESENT ILLNESS:  The patient is a very nice 50 year old gentleman we have been asked to see by Dr. Felecia Shelling to follow up on recent diagnosis of diverticulitis.  He was in his usual state of relatively good health until about two months ago, when he developed abdominal bloating that felt as if it were in the epigastric area with eventual cramping in the lower abdomen.  He reports he was seen in the emergency department at [facility] and told he may have some kind of blockage.  He continued to have problems the next day and therefore was seen at urgent care in [facility], was asked to take some enemas, which did not seem to help his symptoms at all, and was started on an antibiotic as well as a stool softener.  Symptoms went away during the time he was on the antibiotic and even the stool softener, but as soon as he stopped the antibiotic, symptoms seemed to return.  No prior history of similar abdominal pain.  No recent antibiotic use prior to being seen at the urgent care.  Pain was bad enough again about three weeks ago that he returned to the emergency department at [facility].  Had a CT scan done, March 21, 2002, that revealed normal CT of the abdomen but in the pelvis, showed numerous diverticula within the sigmoid colon and marked bowel wall thickening associated with the sigmoid colon and pericolonic inflammatory changes within the fat.  No evidence of abscess.  It was felt the  findings were most compatible with sigmoid colonic diverticulitis and repeat imaging was recommended to be certain that there was not an underlying mass of carcinoma of the colon noted to also possibly have similar appearance.  No adenopathy and remainder of the pelvis appeared normal.  Urinalysis done that day revealed specific gravity greater than 1.030 with trace leukocyte esterase and a few bacteria, 7-10 wbcs.  Has noted that occasionally the cramping pain seems to radiate into the penis but has had no problems with hematuria, dysuria, urinary urgency or frequency.  In the emergency department, he was given 1 g of Rocephin IV and was also started on two oral antibiotics, one being Flagyl and other which he cannot remember the name.  During the time he was on the antibiotics, he had increased number of stools and stools became granular, almost sand-like.  Once again, symptoms were well-controlled while on the antibiotics.  After completing the antibiotics, he did have some mucusy stools as well as urgency and cramping but he has actually been symptom-free over the last two to three days.  Appetite has remained normal and weight has been stable.  He has seen some bright red blood per rectum occasionally with straining that he has attributed to hemorrhoids.  Blood is never mixed in with the stool and is just on the tissue.  No melena.  No nausea, vomiting, dysphagia or odynophagia.  Has had problems with heartburn just  about every day over the last two months; had not been having any problems heartburn prior to this.  Notably, before symptoms symptoms, he had been taking Indocin q.d. on a p.r.n. basis for gout and also had been taking generally three Goodys powders per day for sinus headache.  He stopped taking the Goodys powders about two months ago when he began to have stomach problems.  He has never had EGD or total colonoscopy.  Stools are now soft and greenish-brown in color. Had  history of jaundice as a newborn but none since then.  CURRENT MEDICATIONS:  Indocin once daily p.r.n. and Goodys powders p.r.n., none over the last two months except for one powder yesterday for sinus headache.  ALLERGIES:  No known drug allergies.  PAST MEDICAL HISTORY:  Reports he has enjoyed good health except for gout that developed after back surgeries, history of chronic sinus headaches and history of seizures that went away in the third grade.  There is also some question as to whether or not he had spinal meningitis as a newborn.  PAST SURGICAL HISTORY:  Appendectomy and five back surgeries involving six different disks.  FAMILY HISTORY:  Mother and father are both alive and in good health; one sister and three brothers also alive and well.  There is no family history of colorectal cancer, inflammatory bowel disease or chronic gastric illness. 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  SOCIAL HISTORY:  The patient has been married for 18 years.  He has four sons. He works at [employer].  He has dipped snuff for the last 20 years but is not a smoker, will occasionally have a drink in social situations, perhaps one or two per month at this point; before meeting his wife and getting married, he had been drinking heavily, consuming some alcohol at least seven days per week.  REVIEW OF SYSTEMS:  As in HPI.  In addition, no fevers.  Did have some chills after receiving IV fluids in the emergency department.  No syncopal episodes. No chest pains, history of MI or history of heart or valvular disease.  No cough or dyspnea.  PHYSICAL EXAMINATION:  VITAL SIGNS:  Weight 232 pounds.  Height 5 feet 7 inches.  Temperature 97.9, blood pressure 102/80, pulse 66 and regular.  GENERAL:  A very pleasant, cooperative, well-developed and well-nourished Caucasian male in no acute distress, answers questions quickly and appropriately.  He is accompanied today by his wife  Betti Cruz.   HEENT:  Atraumatic, normocephalic.  Sclerae nonicteric.  Conjunctivae clear. Oropharynx is pink and moist.  NECK:  Supple.  No masses.  No adenopathy.  Trachea midline.  LUNGS:  Clear to auscultation bilaterally.  CARDIOVASCULAR:  S1 and S2.  Regular rate and rhythm without murmur.  ABDOMEN:  Positive normoactive bowel sounds.  Soft, nontender and nondistended.  No masses or organomegaly.  No guarding.  RECTAL:  Adequate sphincter tone.  No masses in the anal canal or rectal vault.  Scant amount of formed yellowish-brown stool that is guaiac-negative.  EXTREMITIES:  No lower extremity edema.  SKIN:  Warm and dry.  No jaundice.  Normal hair distribution.  Multiple tattoos on upper extremities (reports tattoos were all done by reputable folks with new needles).  IMPRESSION: 1. The patient is a very nice 50 year old gentleman with what appears to be a    recent episode of diverticulitis.  On CT scan from March 21, 2002, he had    marked bowel wall thickening  in the sigmoid colon as well as pericolonic    inflammatory changes within the fat as well as numerous diverticula of the    sigmoid colon.  Symptoms improved on antibiotic therapy, although he had    some return of crampy-type abdominal pain after completing antibiotics.  At    this point, it is back to baseline over the last two to three days.  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  Because of CT    findings, would like to further evaluate colon via colonoscopy to check on    the wall thickening as well as to look for sources of intermittent    hematochezia.  Hematochezia by history sounds as if it is related to benign    anorectal disease and would like to check on possibility of polyps or    alternative source of blood loss.  Total colonoscopy was discussed with the    patient and his wife including potential risks, benefits and alternatives    and he is agreeable to proceeding with  procedure. 2. Typical reflux symptoms over the last two months.  Can certainly try him on    proton pump inhibitor and if he does not respond to therapy, consider    esophagogastroduodenoscopy. 3. History of chronic sinus headaches.  Had been taking three Goodys powders    per day to control headaches prior to diverticulitis.  RECOMMENDATIONS: 1. Total colonoscopy at [facility] in approximately three weeks. 2. Begin Protonix 40 mg p.o. q.d. 30 minutes before a meal; he was given    28 sample capsules.  If Protonix controls symptoms, he is to contact our    office and we can give him a prescription. 3. Given GERD information sheet, total colonoscopy pamphlet and diverticular    disease pamphlet. 4. Further workup of chronic sinus headaches per Dr. Felecia Shelling.  We would like to thank Dr. Felecia Shelling for allowing [place] to participate in the care of this very nice gentleman. Dictated by:   Gardiner Coins, P.A. Attending Physician:  Cassell Smiles DD:  04/17/02 TD:  04/20/02 Job: [id number]

## 2011-07-07 LAB — CBC
HCT: 43.1
Hemoglobin: 15.1
MCHC: 34.9
MCV: 85
RDW: 12.8

## 2011-07-07 LAB — DIFFERENTIAL
Basophils Absolute: 0
Eosinophils Absolute: 0.1
Lymphocytes Relative: 24
Lymphs Abs: 1.5
Neutrophils Relative %: 66

## 2011-07-07 LAB — URINALYSIS, ROUTINE W REFLEX MICROSCOPIC
Glucose, UA: NEGATIVE
Leukocytes, UA: NEGATIVE
Nitrite: NEGATIVE
Protein, ur: NEGATIVE
pH: 5

## 2011-07-07 LAB — BASIC METABOLIC PANEL
BUN: 13
Calcium: 8.5
GFR calc non Af Amer: >60
Glucose, Bld: 126 — ABNORMAL HIGH
Sodium: 139

## 2011-07-07 LAB — URINE MICROSCOPIC-ADD ON

## 2011-07-12 LAB — BASIC METABOLIC PANEL
BUN: 13
Creatinine, Ser: 0.84

## 2011-07-12 LAB — DIFFERENTIAL
Basophils Absolute: 0.1
Eosinophils Relative: 1
Lymphocytes Relative: 23
Lymphs Abs: 2.1
Neutro Abs: 6.2
Neutrophils Relative %: 67

## 2011-07-12 LAB — CBC
MCHC: 34
Platelets: 263
RDW: 12.7

## 2011-07-12 LAB — URIC ACID: Uric Acid, Serum: 8 — ABNORMAL HIGH

## 2011-10-07 ENCOUNTER — Emergency Department (HOSPITAL_COMMUNITY)
Admission: EM | Admit: 2011-10-07 | Discharge: 2011-10-07 | Disposition: A | Discharge status: Home / Self Care | Payer: BC Managed Care – PPO | Attending: Emergency Medicine | Admitting: Emergency Medicine

## 2011-10-07 DIAGNOSIS — M25579 Pain in unspecified ankle and joints of unspecified foot: Secondary | ICD-10-CM | POA: Insufficient documentation

## 2011-10-07 DIAGNOSIS — B9789 Other viral agents as the cause of diseases classified elsewhere: Secondary | ICD-10-CM | POA: Insufficient documentation

## 2011-10-07 DIAGNOSIS — R059 Cough, unspecified: Secondary | ICD-10-CM | POA: Insufficient documentation

## 2011-10-07 DIAGNOSIS — R05 Cough: Secondary | ICD-10-CM | POA: Insufficient documentation

## 2011-10-07 DIAGNOSIS — B349 Viral infection, unspecified: Secondary | ICD-10-CM

## 2011-10-07 DIAGNOSIS — R6883 Chills (without fever): Secondary | ICD-10-CM | POA: Insufficient documentation

## 2011-10-07 DIAGNOSIS — IMO0001 Reserved for inherently not codable concepts without codable children: Secondary | ICD-10-CM | POA: Insufficient documentation

## 2011-10-07 DIAGNOSIS — R61 Generalized hyperhidrosis: Secondary | ICD-10-CM | POA: Insufficient documentation

## 2011-10-07 NOTE — ED Provider Notes (Signed)
History     CSN: [id number]  Arrival date & time 10/07/11  1409   First MD Initiated Contact with Patient 10/07/11 1600      Chief Complaint  Patient presents with  . Ankle Pain     Patient is a 50 y.o. male presenting with cough. The history is provided by the patient.  Cough This is a new problem. The current episode started yesterday. The problem occurs hourly. The problem has not changed since onset.The cough is productive of sputum. The maximum temperature recorded prior to his arrival was 100 to 100.9 F. Associated symptoms include chills, sweats and myalgias. Pertinent negatives include no shortness of breath and no wheezing.  pt reports cough/congestion/myaglias/fever since yesterday No SOB reported No CP reported  Also reports bilateral ankle pain for weeks, no trauma Also reports "knot" to right lateral knee, no trauma/redness  PMH = denies  Past Surgical History  Procedure Date  . Back surgery   . Cardiac catheterization     No family history on file.  History  Substance Use Topics  . Smoking status: Never Smoker   . Smokeless tobacco: Not on file  . Alcohol Use: No      Review of Systems  Constitutional: Positive for chills.  Respiratory: Positive for cough. Negative for shortness of breath and wheezing.   Musculoskeletal: Positive for myalgias.    Allergies  Review of patient's allergies indicates no known allergies.  Home Medications   Current Outpatient Rx  Name Route Sig Dispense Refill  . INDOCIN PO Oral Take 1 tablet by mouth daily as needed. For gout flares       BP 156/83  Pulse 76  Temp(Src) 98.3 F (36.8 C) (Oral)  Resp 18  Ht 5\' 8"  (1.727 m)  SpO2 98%  Physical Exam  CONSTITUTIONAL: Well developed/well nourished HEAD AND FACE: Normocephalic/atraumatic EYES: EOMI/PERRL ENMT: Mucous membranes moist, uvulai midline, pharynx normal NECK: supple no meningeal signs CV: S1/S2 noted, no murmurs/rubs/gallops noted LUNGS: Lungs  are clear to auscultation bilaterally, no apparent distress ABDOMEN: soft, nontender, no rebound or guarding GU:no cva tenderness NEURO: Pt is awake/alert, moves all extremitiesx4 EXTREMITIES: pulses normal, full ROM No edema No erythema noted Ankles are symmetric without significant edema Full ROM of all joints in lower extremities Small mobile nodule to right lateral knee, no erythema/tenderness noted SKIN: warm, color normal PSYCH: no abnormalities of mood noted   ED Course  Procedures     1. Viral syndrome       MDM  Nursing notes reviewed and considered in documentation No signs of cellulitis/septic joint/DVT         Joya Gaskins, MD 10/07/11 1645

## 2011-10-07 NOTE — ED Notes (Signed)
Pt c/o bilateral ankle pain off and on x 3 weeks.  Also c/o movable knot on lateral aspect of R knee.  Reports this am has had episodes of feeling  hot and cold.

## 2012-03-13 ENCOUNTER — Emergency Department (HOSPITAL_COMMUNITY): Payer: BC Managed Care – PPO

## 2012-03-13 ENCOUNTER — Other Ambulatory Visit: Payer: Self-pay

## 2012-03-13 ENCOUNTER — Encounter (HOSPITAL_COMMUNITY): Payer: Self-pay | Admitting: *Deleted

## 2012-03-13 ENCOUNTER — Emergency Department (HOSPITAL_COMMUNITY)
Admission: EM | Admit: 2012-03-13 | Discharge: 2012-03-14 | Disposition: A | Discharge status: Home / Self Care | Payer: BC Managed Care – PPO | Attending: Emergency Medicine | Admitting: Emergency Medicine

## 2012-03-13 DIAGNOSIS — R079 Chest pain, unspecified: Secondary | ICD-10-CM | POA: Insufficient documentation

## 2012-03-13 DIAGNOSIS — E669 Obesity, unspecified: Secondary | ICD-10-CM

## 2012-03-13 DIAGNOSIS — R1013 Epigastric pain: Secondary | ICD-10-CM

## 2012-03-13 DIAGNOSIS — Z6834 Body mass index (BMI) 34.0-34.9, adult: Secondary | ICD-10-CM | POA: Insufficient documentation

## 2012-03-13 DIAGNOSIS — K3189 Other diseases of stomach and duodenum: Secondary | ICD-10-CM

## 2012-03-13 HISTORY — DX: Gout, unspecified: M10.9

## 2012-03-13 HISTORY — DX: Obesity, unspecified: E66.9

## 2012-03-13 HISTORY — DX: Diverticulosis of intestine, part unspecified, without perforation or abscess without bleeding: K57.90

## 2012-03-13 LAB — COMPREHENSIVE METABOLIC PANEL
AST: 17 U/L (ref 0–37)
Albumin: 3.8 g/dL (ref 3.5–5.2)
Alkaline Phosphatase: 68 U/L (ref 39–117)
BUN: 12 mg/dL (ref 6–23)
Chloride: 100 mEq/L (ref 96–112)
Potassium: 3.9 mEq/L (ref 3.5–5.1)
Total Bilirubin: 0.1 mg/dL — ABNORMAL LOW (ref 0.3–1.2)

## 2012-03-13 LAB — DIFFERENTIAL
Basophils Absolute: 0.1 10*3/uL (ref 0.0–0.1)
Lymphocytes Relative: 24 % (ref 12–46)
Lymphs Abs: 2.4 10*3/uL (ref 0.7–4.0)
Neutro Abs: 6.5 10*3/uL (ref 1.7–7.7)
Neutrophils Relative %: 67 % (ref 43–77)

## 2012-03-13 LAB — PROTIME-INR
INR: 0.95 (ref 0.00–1.49)
Prothrombin Time: 12.9 seconds (ref 11.6–15.2)

## 2012-03-13 LAB — CBC
HCT: 43.8 % (ref 39.0–52.0)
Hemoglobin: 14.6 g/dL (ref 13.0–17.0)
MCHC: 33.3 g/dL (ref 30.0–36.0)
MCV: 86.1 fL (ref 78.0–100.0)
RDW: 13.2 % (ref 11.5–15.5)

## 2012-03-13 LAB — CARDIAC PANEL(CRET KIN+CKTOT+MB+TROPI)
CK, MB: 4.7 ng/mL — ABNORMAL HIGH (ref 0.3–4.0)
Total CK: 185 U/L (ref 7–232)
Troponin I: <0.3 ng/mL (ref <0.30)

## 2012-03-13 MED ORDER — ASPIRIN 81 MG PO CHEW
324.0000 mg | CHEWABLE_TABLET | Freq: Once | ORAL | Status: AC
  Administered 2012-03-13: 324 mg via ORAL
  Filled 2012-03-13: qty 4

## 2012-03-13 MED ORDER — ONDANSETRON HCL 4 MG/2ML IJ SOLN: 4.0000 mg | Freq: Three times a day (TID) | INTRAMUSCULAR | Status: DC | PRN

## 2012-03-13 MED ORDER — SODIUM CHLORIDE 0.9 % IV SOLN: INTRAVENOUS | Status: DC

## 2012-03-13 NOTE — Consult Note (Signed)
Emergency room consult:  Date of consult:  03/13/12  Physician requesting the consult:  Glynn Octave M.D.  Consulting physician: Vania Rea M.D.  PCP:   Colette Ribas, MD, MD    Reason for consult: Episodic Chest pain since yesterday  ASSESSMENT: Non-cardiac chest pain Obesity History of gout Dyspepsia Only known cardiac risk factors: Gender, age & obesity Normal cardiac Normal EKG No indication for admission to acute care  Recommendations: Discharged home with early cardiac followup, [facility] Collected blood for fasting lipid panel prior to discharge, since patient has not eaten for the past 10 hours Description for aspirin and Protonix daily, until seen by cardiology Return to the emergency room for worsening or recurrence of chest pain   HPI: John Wallace is an 51 y.o. male.    Denies any chronic medical problems except episodic gout; and attack route every 6 months, managed with when necessary indomethacin. History of cardiac catheterization 9 years ago by the [facility] reported as normal. No other cardiac history no history of chest pains climbing stairs or with activity times flights of stairs every day without shortness of breath or chest pain. Has otherwise been in good state of health until while visiting a relative at [facility] yesterday developed in the precordial chest discomfort which felt like "gas"because he had not eaten, resolved spontaneously within 10 minutes. She had a recurrence of a similar pain today before midday and decided he would try to help in eating a hot dog with some most, and that seemed to aggravate the pain made it very severe 9/10 radiating to his left shoulder. He drew himself to the emergency room where his seat 4 baby aspirin the pain resolved. He did not receive nitroglycerin. Is no associated nausea vomiting dizziness or diaphoresis. EKG was done which showed normal sinus rhythm, rate in the low 60s; his  cardiac markers are completely normal.   Rewiew of Systems:  The patient denies anorexia, fever, weight loss,, vision loss, decreased hearing, hoarseness,  syncope, dyspnea on exertion, peripheral edema, balance deficits, hemoptysis, abdominal pain, melena, hematochezia, severe indigestion/heartburn, hematuria, incontinence, genital sores, muscle weakness, suspicious skin lesions, transient blindness, difficulty walking, depression, unusual weight change, abnormal bleeding, enlarged lymph nodes, angioedema, and breast masses.   Past Medical History  Diagnosis Date  . Gout   . Obesity   . Diverticulosis     Past Surgical History  Procedure Date  . Back surgery   . Cardiac catheterization   . Partial colectomy     Dr Malvin Johns    Medications:  denies use of any prescribed or over-the-counter medications currently HOME MEDS: Prior to Admission medications   Not on File     Allergies:  No Known Allergies  Social History:   reports that he has never smoked. He does not have any smokeless tobacco history on file. He reports that he does not drink alcohol or use illicit drugs.  Family History: History reviewed. No pertinent family history. No family history of cardiac disease    Physical Exam: Filed Vitals:   03/13/12 2012  BP: 158/92  Pulse: 65  Temp: 98 F (36.7 C)  TempSrc: Oral  Resp: 20  Height: 5\' 7"  (1.702 m)  Weight: 99.791 kg (220 lb)  SpO2: 100%   Blood pressure 158/92, pulse 65, temperature 98 F (36.7 C), temperature source Oral, resp. rate 20, height 5\' 7"  (1.702 m), weight 99.791 kg (220 lb), SpO2 100.00%.  GEN:  Pleasant obese Caucasian gentleman lying in  the stretcher indistress; cooperative with exam PSYCH:  alert and oriented x4; does not appear anxious or depressed; affect is appropriate. HEENT: Mucous membranes pink and anicteric; PERRLA; EOM intact;  thick neck no cervical lymphadenopathy nor thyromegaly or carotid bruit; no JVD; Breasts:: Not  examined CHEST WALL: No tenderness CHEST: Normal respiration, clear to auscultation bilaterally HEART: Regular rate and rhythm; no murmurs rubs or gallops BACK: No kyphosis or scoliosis; no CVA tenderness ABDOMEN: Obese, soft non-tender; no masses, no organomegaly, normal abdominal bowel sounds;  no intertriginous candida. Rectal Exam: Not done EXTREMITIES: No bone or joint deformity; ano edema; no ulcerations. Genitalia: not examined PULSES: 2+ and symmetric SKIN: Normal hydration no rash or ulceration CNS: Cranial nerves 2-12 grossly intact no focal lateralizing neurologic deficit   Labs & Imaging Results for orders placed during the hospital encounter of 03/13/12 (from the past 48 hour(s))  CBC     Status: Normal   Collection Time   03/13/12  8:49 PM      Component Value Range Comment   WBC 9.8  4.0 - 10.5 (K/uL)    RBC 5.09  4.22 - 5.81 (MIL/uL)    Hemoglobin 14.6  13.0 - 17.0 (g/dL)    HCT 45.4  09.8 - 11.9 (%)    MCV 86.1  78.0 - 100.0 (fL)    MCH 28.7  26.0 - 34.0 (pg)    MCHC 33.3  30.0 - 36.0 (g/dL)    RDW 14.7  82.9 - 56.2 (%)    Platelets 245  150 - 400 (K/uL)   DIFFERENTIAL     Status: Normal   Collection Time   03/13/12  8:49 PM      Component Value Range Comment   Neutrophils Relative 67  43 - 77 (%)    Neutro Abs 6.5  1.7 - 7.7 (K/uL)    Lymphocytes Relative 24  12 - 46 (%)    Lymphs Abs 2.4  0.7 - 4.0 (K/uL)    Monocytes Relative 7  3 - 12 (%)    Monocytes Absolute 0.7  0.1 - 1.0 (K/uL)    Eosinophils Relative 1  0 - 5 (%)    Eosinophils Absolute 0.1  0.0 - 0.7 (K/uL)    Basophils Relative 1  0 - 1 (%)    Basophils Absolute 0.1  0.0 - 0.1 (K/uL)   COMPREHENSIVE METABOLIC PANEL     Status: Abnormal   Collection Time   03/13/12  8:49 PM      Component Value Range Comment   Sodium 137  135 - 145 (mEq/L)    Potassium 3.9  3.5 - 5.1 (mEq/L)    Chloride 100  96 - 112 (mEq/L)    CO2 28  19 - 32 (mEq/L)    Glucose, Bld 107 (*) 70 - 99 (mg/dL)    BUN 12  6 - 23  (mg/dL)    Creatinine, Ser 1.30  0.50 - 1.35 (mg/dL)    Calcium 9.6  8.4 - 10.5 (mg/dL)    Total Protein 7.8  6.0 - 8.3 (g/dL)    Albumin 3.8  3.5 - 5.2 (g/dL)    AST 17  0 - 37 (U/L)    ALT 20  0 - 53 (U/L)    Alkaline Phosphatase 68  39 - 117 (U/L)    Total Bilirubin 0.1 (*) 0.3 - 1.2 (mg/dL)    GFR calc non Af Amer >90  >90 (mL/min)    GFR calc Af Amer >90  >  90 (mL/min)   PROTIME-INR     Status: Normal   Collection Time   03/13/12  8:49 PM      Component Value Range Comment   Prothrombin Time 12.9  11.6 - 15.2 (seconds)    INR 0.95  0.00 - 1.49    CARDIAC PANEL(CRET KIN+CKTOT+MB+TROPI)     Status: Abnormal   Collection Time   03/13/12  8:49 PM      Component Value Range Comment   Total CK 185  7 - 232 (U/L)    CK, MB 4.7 (*) 0.3 - 4.0 (ng/mL)    Troponin I <0.30  <0.30 (ng/mL)    Relative Index 2.5  0.0 - 2.5     Dg Chest 2 View  03/13/2012  *RADIOLOGY REPORT*  Clinical Data: Chest pain.  CHEST - 2 VIEW  Comparison: None  Findings: The cardiac silhouette, mediastinal and hilar contours are within normal limits.  The lungs are clear of acute process. Probable mild bronchitic changes.  No pleural effusion.  The bony thorax is intact.  Degenerative changes noted in the mid and lower thoracic spine.  IMPRESSION: No acute cardiopulmonary findings.  Original Report Authenticated By: P. Loralie Champagne, M.D.   EKG shows a normal sinus rhythm, no ST segment changes   Steffie Waggoner 03/13/2012, 11:48 PM

## 2012-03-13 NOTE — ED Provider Notes (Signed)
History   This chart was scribed for Glynn Octave, MD by Shari Heritage. The patient was seen in room APA08/APA08. Patient's care was started at 2007.     CSN: [id number]  Arrival date & time 03/13/12  2007   First MD Initiated Contact with Patient 03/13/12 2040      Chief Complaint  Patient presents with  . Chest Pain    (Consider location/radiation/quality/duration/timing/severity/associated sxs/prior treatment) HPI John Wallace is a 51 y.o. male who presents to the Emergency Department complaining of moderate, waxing and waning chest pain onset several hours ago. Patient describes pain as throbbing and it radiates to left shoulder and down left arm. Pain says that he was visiting his aunt after her surgery at [facility] when the pain began. He initially thought it was gas. Patient said he first began to feel twinges in his chest 3 days ago, but today, he began experiencing waxing and waning chest pain every 15-20 minutes since earlier today. Patient has also been experiencing disorientation and cold sweats. Patient took four Aspirin today. Patient with h/o of cardiac catheterization (9 years ago) and gout. Patient has never smoked.   Cardiologist - Corinda Gubler   History reviewed. No pertinent past medical history.  Past Surgical History  Procedure Date  . Back surgery   . Cardiac catheterization     History reviewed. No pertinent family history.  History  Substance Use Topics  . Smoking status: Never Smoker   . Smokeless tobacco: Not on file  . Alcohol Use: No      Review of Systems A complete 10 system review of systems was obtained and all systems are negative except as noted in the HPI and PMH.   Allergies  Review of patient's allergies indicates no known allergies.  Home Medications   No current outpatient prescriptions on file.  BP 158/92  Pulse 65  Temp(Src) 98 F (36.7 C) (Oral)  Resp 20  Ht 5\' 7"  (1.702 m)  Wt 220 lb (99.791 kg)  BMI 34.46 kg/m2   SpO2 100%  Physical Exam  Constitutional: He is oriented to person, place, and time. He appears well-developed.  HENT:  Head: Normocephalic and atraumatic.  Eyes: Conjunctivae and EOM are normal. No scleral icterus.  Neck: Neck supple. No thyromegaly present.  Cardiovascular: Normal rate and regular rhythm.   No murmur heard. Pulmonary/Chest: Effort normal and breath sounds normal. No stridor. He has no wheezes. He has no rales.       Chest pain is not reproducible.  Abdominal: Soft.  Musculoskeletal: Normal range of motion. He exhibits no edema.       Equal grip strengths.  Lymphadenopathy:    He has no cervical adenopathy.  Neurological: He is oriented to person, place, and time. Coordination normal.  Skin: No rash noted. No erythema.  Psychiatric: He has a normal mood and affect. His behavior is normal.    ED Course  Procedures (including critical care time) DIAGNOSTIC STUDIES: Oxygen Saturation is 100% on room air, normal by my interpretation.    COORDINATION OF CARE: 8:48pm - Patient informed of current plan for treatment and evaluation and agrees with plan at this time. Will order blood tests.  9:55pm - Consult with hospitalist. Patient to be admitted. Labs Reviewed  COMPREHENSIVE METABOLIC PANEL - Abnormal; Notable for the following:    Glucose, Bld 107 (*)    Total Bilirubin 0.1 (*)    All other components within normal limits  CARDIAC PANEL(CRET KIN+CKTOT+MB+TROPI) - Abnormal; Notable  for the following:    CK, MB 4.7 (*)    All other components within normal limits  CBC  DIFFERENTIAL  PROTIME-INR   Dg Chest 2 View  03/13/2012  *RADIOLOGY REPORT*  Clinical Data: Chest pain.  CHEST - 2 VIEW  Comparison: None  Findings: The cardiac silhouette, mediastinal and hilar contours are within normal limits.  The lungs are clear of acute process. Probable mild bronchitic changes.  No pleural effusion.  The bony thorax is intact.  Degenerative changes noted in the mid and  lower thoracic spine.  IMPRESSION: No acute cardiopulmonary findings.  Original Report Authenticated By: P. Loralie Champagne, M.D.     1. Chest pain       MDM  Left sided chest pain radiating to left arm has been coming and going all day waxing and waning in severity lasting 20 minutes at a time. No pain currently. Associated with nausea, no vomiting, no shortness of breath. Clean cardiac catheterization in 2004.  Atypical and typical features for angina.  Pain at rest, not on exertion.  EKG normal. Troponin negative.  Only risk factor is obesity, patient is non smoker. D.w Dr. Orvan Falconer.  Patient stable for outpatient stress test with Dr. Regino Schultze.    Date: 03/13/2012  Rate: 64  Rhythm: normal sinus rhythm  QRS Axis: normal  Intervals: normal  ST/T Wave abnormalities: normal  Conduction Disutrbances:none  Narrative Interpretation:   Old EKG Reviewed: unchanged    I personally performed the services described in this documentation, which was scribed in my presence.  The recorded information has been reviewed and considered.    Glynn Octave, MD 03/14/12 1115

## 2012-03-13 NOTE — ED Notes (Signed)
Dr. Campbell in to see patient.

## 2012-03-13 NOTE — ED Notes (Signed)
Lt chest pain with radiation into lt shoulder and elbow.  Onset 3 days ago.  Nausea, no vomiting, No sob.

## 2012-03-14 LAB — LIPID PANEL
LDL Cholesterol: 138 mg/dL — ABNORMAL HIGH (ref 0–99)
Triglycerides: 147 mg/dL (ref <150)
VLDL: 29 mg/dL (ref 0–40)

## 2012-03-14 NOTE — Progress Notes (Signed)
2349 Patient here for chest pain. Dr. Orvan Falconer, hospitalist has seen and evaluated the patient in the ER. Patient had a cath 9 years ago by [facility] which was clean. He has no risk factors except obesity. Dr. Orvan Falconer feels the patient is stable to be discharged home with follow up with his PCP and with [facility] cardiology. Discharge papers have been prepared at his request.

## 2012-03-14 NOTE — Progress Notes (Deleted)
2349 Dr. Orvan Falconer, hospitalist has seen the patient in the ER. Patient has been seen by [facility], Dr. Laneta Simmers today and plan is to remove PICC line in the morning. If he continues to have fevers, he is to see Dr. Laneta Simmers sooner than the next appointment. Dr. Orvan Falconer felt the patient was stable to return to the [facility]. Discharge papers were completed on his behalf.

## 2012-03-14 NOTE — Discharge Instructions (Signed)
Your heart numbers were normal here tonight. Follow up with Dr. Sherwood Gambler. Call Dr. Dietrich Pates with [facility] for a follow up appointment.

## 2012-03-14 NOTE — ED Notes (Signed)
Monitor continues to show NSR without ectopy

## 2013-02-25 ENCOUNTER — Emergency Department (HOSPITAL_COMMUNITY)
Admission: EM | Admit: 2013-02-25 | Discharge: 2013-02-25 | Disposition: A | Discharge status: Home / Self Care | Payer: BC Managed Care – PPO | Attending: Emergency Medicine | Admitting: Emergency Medicine

## 2013-02-25 ENCOUNTER — Encounter (HOSPITAL_COMMUNITY): Payer: Self-pay | Admitting: *Deleted

## 2013-02-25 DIAGNOSIS — M109 Gout, unspecified: Secondary | ICD-10-CM | POA: Insufficient documentation

## 2013-02-25 DIAGNOSIS — Z8719 Personal history of other diseases of the digestive system: Secondary | ICD-10-CM | POA: Insufficient documentation

## 2013-02-25 DIAGNOSIS — Z79899 Other long term (current) drug therapy: Secondary | ICD-10-CM | POA: Insufficient documentation

## 2013-02-25 DIAGNOSIS — E669 Obesity, unspecified: Secondary | ICD-10-CM | POA: Insufficient documentation

## 2013-02-25 DIAGNOSIS — M2540 Effusion, unspecified joint: Secondary | ICD-10-CM | POA: Insufficient documentation

## 2013-02-25 MED ORDER — OXYCODONE-ACETAMINOPHEN 5-325 MG PO TABS: 1.0000 | ORAL_TABLET | ORAL | Status: DC | PRN

## 2013-02-25 MED ORDER — INDOMETHACIN 50 MG PO CAPS: 50.0000 mg | ORAL_CAPSULE | Freq: Three times a day (TID) | ORAL | Status: DC

## 2013-02-25 MED ORDER — INDOMETHACIN 25 MG PO CAPS
50.0000 mg | ORAL_CAPSULE | Freq: Once | ORAL | Status: AC
  Administered 2013-02-25: 50 mg via ORAL
  Filled 2013-02-25: qty 2

## 2013-02-25 MED ORDER — HYDROMORPHONE HCL PF 1 MG/ML IJ SOLN
1.0000 mg | Freq: Once | INTRAMUSCULAR | Status: AC
  Administered 2013-02-25: 1 mg via INTRAMUSCULAR
  Filled 2013-02-25: qty 1

## 2013-02-25 NOTE — ED Notes (Signed)
Pt presents to er with right knee pain that started yesterday, has hx of gout, started taking his gout medication  without improvement, denies any injury, swelling noted to right knee, cms intact distal

## 2013-02-25 NOTE — ED Provider Notes (Signed)
History     CSN: [id number]  Arrival date & time 02/25/13  1107   First MD Initiated Contact with Patient 02/25/13 1121      Chief Complaint  Patient presents with  . Knee Pain    (Consider location/radiation/quality/duration/timing/severity/associated sxs/prior treatment) HPI Comments: John Wallace is a 52 y.o. Male presenting with an acute exacerbation of his right knee gout.  The pain and swelling woke him 2 nights ago, stating it started rather suddenly and is consistent with other gouty flares. He denies injury and denies radiation of pain.  He started taking his colchicine, and typically within the 24 hours the colchine along with indocin (which he is out of) gets his pain under control, but not today as he did not have the indocin.  Pain is increased with ROM, weight bearing and even light touch over the joint.  Nothing relieves his pain.       The history is provided by the patient.    Past Medical History  Diagnosis Date  . Gout   . Obesity   . Diverticulosis     Past Surgical History  Procedure Laterality Date  . Back surgery    . Cardiac catheterization    . Partial colectomy      Dr Malvin Johns    No family history on file.  History  Substance Use Topics  . Smoking status: Never Smoker   . Smokeless tobacco: Not on file  . Alcohol Use: No      Review of Systems  Constitutional: Negative for fever and chills.  Musculoskeletal: Positive for joint swelling and arthralgias. Negative for myalgias.  Skin: Positive for color change. Negative for rash and wound.  Neurological: Negative for weakness and numbness.    Allergies  Review of patient's allergies indicates no known allergies.  Home Medications   Current Outpatient Rx  Name  Route  Sig  Dispense  Refill  . Aspirin-Caffeine (BC FAST PAIN RELIEF ARTHRITIS PO)   Oral   Take 1 Package by mouth daily as needed (pain).         . colchicine 0.6 MG tablet   Oral   Take 0.6 mg by mouth daily  as needed (gout flare).         Marland Kitchen ibuprofen (ADVIL,MOTRIN) 200 MG tablet   Oral   Take 800 mg by mouth every 6 (six) hours as needed for pain.         . indomethacin (INDOCIN) 50 MG capsule   Oral   Take 1 capsule (50 mg total) by mouth 3 (three) times daily with meals.   30 capsule   0   . oxyCODONE-acetaminophen (PERCOCET/ROXICET) 5-325 MG per tablet   Oral   Take 1 tablet by mouth every 4 (four) hours as needed for pain.   20 tablet   0     BP 155/100  Pulse 76  Temp(Src) 97.7 F (36.5 C) (Oral)  Resp 17  Ht 5' 7.5" (1.715 m)  Wt 225 lb (102.059 kg)  BMI 34.7 kg/m2  SpO2 98%  Physical Exam  Constitutional: He appears well-developed and well-nourished.  HENT:  Head: Atraumatic.  Neck: Normal range of motion.  Cardiovascular:  Pulses equal bilaterally  Musculoskeletal: He exhibits edema and tenderness.       Right knee: He exhibits swelling and erythema. He exhibits no effusion. Tenderness found. Medial joint line and lateral joint line tenderness noted.  Mild edema and erythema of right knee.  Tender to palpation,  Increased pain with ROM.  No visible trauma.  Neurological: He is alert. He has normal strength. He displays normal reflexes. No sensory deficit.  Equal strength  Skin: Skin is warm and dry.  Psychiatric: He has a normal mood and affect.    ED Course  Procedures (including critical care time)  Labs Reviewed - No data to display No results found.   1. Gout flare       MDM  Pain syndrome and exam findings consistent with gouty flare.  Pt was prescribed indocin and oxycodone.  He was given an IM dose of dilaudid 1 mg while here for quicker pain improvement,  Family member driving home.  Encouraged f/u with pcp this week for a recheck if sx not improved.  Encouraged elevation,  Warm compresses.        Burgess Amor, PA-C 02/25/13 2149

## 2013-02-25 NOTE — ED Notes (Signed)
Pt presents with pain and swelling to right knee. Pt has hx of gout and states his usual medications are not working.

## 2013-02-26 NOTE — ED Provider Notes (Signed)
Medical screening examination/treatment/procedure(s) were performed by non-physician practitioner and as supervising physician I was immediately available for consultation/collaboration.   Eretria Manternach W Nica Friske, MD 02/26/13 0707 

## 2013-05-07 ENCOUNTER — Emergency Department (HOSPITAL_COMMUNITY)
Admission: EM | Admit: 2013-05-07 | Discharge: 2013-05-08 | Disposition: A | Discharge status: Home / Self Care | Payer: BC Managed Care – PPO | Attending: Emergency Medicine | Admitting: Emergency Medicine

## 2013-05-07 ENCOUNTER — Encounter (HOSPITAL_COMMUNITY): Payer: Self-pay | Admitting: Emergency Medicine

## 2013-05-07 DIAGNOSIS — E669 Obesity, unspecified: Secondary | ICD-10-CM | POA: Insufficient documentation

## 2013-05-07 DIAGNOSIS — Z9861 Coronary angioplasty status: Secondary | ICD-10-CM | POA: Insufficient documentation

## 2013-05-07 DIAGNOSIS — M25579 Pain in unspecified ankle and joints of unspecified foot: Secondary | ICD-10-CM | POA: Insufficient documentation

## 2013-05-07 DIAGNOSIS — Z8719 Personal history of other diseases of the digestive system: Secondary | ICD-10-CM | POA: Insufficient documentation

## 2013-05-07 DIAGNOSIS — M25571 Pain in right ankle and joints of right foot: Secondary | ICD-10-CM

## 2013-05-07 MED ORDER — HYDROCODONE-ACETAMINOPHEN 7.5-325 MG PO TABS: 1.0000 | ORAL_TABLET | ORAL | Status: DC | PRN

## 2013-05-07 MED ORDER — HYDROMORPHONE HCL PF 1 MG/ML IJ SOLN: 1.0000 mg | Freq: Once | INTRAMUSCULAR | Status: DC

## 2013-05-07 MED ORDER — ONDANSETRON HCL 4 MG PO TABS
4.0000 mg | ORAL_TABLET | Freq: Once | ORAL | Status: AC
  Administered 2013-05-07: 4 mg via ORAL
  Filled 2013-05-07: qty 1

## 2013-05-07 MED ORDER — KETOROLAC TROMETHAMINE 10 MG PO TABS
10.0000 mg | ORAL_TABLET | Freq: Once | ORAL | Status: AC
  Administered 2013-05-07: 10 mg via ORAL
  Filled 2013-05-07: qty 1

## 2013-05-07 MED ORDER — HYDROMORPHONE HCL PF 1 MG/ML IJ SOLN
1.0000 mg | Freq: Once | INTRAMUSCULAR | Status: AC
  Administered 2013-05-07: 1 mg via INTRAMUSCULAR
  Filled 2013-05-07: qty 1

## 2013-05-07 MED ORDER — INDOMETHACIN 25 MG PO CAPS: 25.0000 mg | ORAL_CAPSULE | Freq: Three times a day (TID) | ORAL | Status: DC | PRN

## 2013-05-07 NOTE — ED Notes (Signed)
Onset of right ankle swelling three days ago - started on his colchicine and has not relief.  Swelling right ankle

## 2013-05-07 NOTE — ED Provider Notes (Signed)
CSN: [id number]     Arrival date & time 05/07/13  2141 History     First MD Initiated Contact with Patient 05/07/13 2224     Chief Complaint  Patient presents with  . Gout   (Consider location/radiation/quality/duration/timing/severity/associated sxs/prior Treatment) Patient is a 52 y.o. male presenting with ankle pain. The history is provided by the patient.  Ankle Pain Location:  Ankle Ankle location:  R ankle Pain details:    Quality:  Aching and shooting   Radiates to:  Does not radiate   Severity:  Severe   Onset quality:  Gradual   Duration:  3 days   Timing:  Intermittent   Progression:  Worsening Chronicity:  Recurrent Dislocation: no   Foreign body present:  No foreign bodies Prior injury to area:  No Relieved by:  Nothing Worsened by:  Bearing weight Ineffective treatments:  NSAIDs Associated symptoms: decreased ROM   Associated symptoms: no back pain, no fever, no neck pain and no numbness   Risk factors comment:  Hx of DJD and gout.   Past Medical History  Diagnosis Date  . Gout   . Obesity   . Diverticulosis    Past Surgical History  Procedure Laterality Date  . Back surgery    . Partial colectomy      Dr Malvin Johns  . Cardiac catheterization     History reviewed. No pertinent family history. History  Substance Use Topics  . Smoking status: Never Smoker   . Smokeless tobacco: Not on file  . Alcohol Use: No    Review of Systems  Constitutional: Negative for fever and activity change.       All ROS Neg except as noted in HPI  HENT: Negative for nosebleeds and neck pain.   Eyes: Negative for photophobia and discharge.  Respiratory: Negative for cough, shortness of breath and wheezing.   Cardiovascular: Negative for chest pain and palpitations.  Gastrointestinal: Negative for abdominal pain and blood in stool.  Genitourinary: Negative for dysuria, frequency and hematuria.  Musculoskeletal: Negative for back pain and arthralgias.  Skin:  Negative.   Neurological: Negative for dizziness, seizures and speech difficulty.  Psychiatric/Behavioral: Negative for hallucinations and confusion.    Allergies  Review of patient's allergies indicates no known allergies.  Home Medications   Current Outpatient Rx  Name  Route  Sig  Dispense  Refill  . Aspirin-Caffeine (BC FAST PAIN RELIEF ARTHRITIS PO)   Oral   Take 1 Package by mouth daily as needed (pain).         . colchicine 0.6 MG tablet   Oral   Take 0.6 mg by mouth daily as needed (gout flare).         Marland Kitchen ibuprofen (ADVIL,MOTRIN) 200 MG tablet   Oral   Take 800 mg by mouth every 6 (six) hours as needed for pain.          BP 154/90  Pulse 74  Temp(Src) 98.1 F (36.7 C) (Oral)  Resp 20  Ht 5\' 7"  (1.702 m)  Wt 225 lb (102.059 kg)  BMI 35.23 kg/m2  SpO2 98% Physical Exam  Nursing note and vitals reviewed. Constitutional: He is oriented to person, place, and time. He appears well-developed and well-nourished.  Non-toxic appearance.  HENT:  Head: Normocephalic.  Right Ear: Tympanic membrane and external ear normal.  Left Ear: Tympanic membrane and external ear normal.  Eyes: EOM and lids are normal. Pupils are equal, round, and reactive to light.  Neck: Normal range of  motion. Neck supple. Carotid bruit is not present.  Cardiovascular: Normal rate, regular rhythm, normal heart sounds, intact distal pulses and normal pulses.   Pulmonary/Chest: Breath sounds normal. No respiratory distress.  Abdominal: Soft. Bowel sounds are normal. There is no tenderness. There is no guarding.  Musculoskeletal: He exhibits tenderness.  Pain to even light touch of the lateral right ankle. FROM  Of the toes. No lesion between the toes.  Lymphadenopathy:       Head (right side): No submandibular adenopathy present.       Head (left side): No submandibular adenopathy present.    He has no cervical adenopathy.  Neurological: He is alert and oriented to person, place, and time. He  has normal strength. No cranial nerve deficit or sensory deficit.  Skin: Skin is warm and dry.  Psychiatric: He has a normal mood and affect. His speech is normal.    ED Course   Procedures (including critical care time)  Labs Reviewed - No data to display No results found. No diagnosis found.  MDM  **I have reviewed nursing notes, vital signs, and all appropriate lab and imaging results for this patient. Pt has pain of the right ankle. No hx of known injury or trauma. Pt can't stand cover to touch the right lateral ankle. Suspect DJD attack or gout.  Pt has colchicine at home, will add norco and indocin. Pt to follow up with primary MD.  Kathie Dike, PA-C 05/09/13 [phone]

## 2013-05-15 NOTE — ED Provider Notes (Signed)
Medical screening examination/treatment/procedure(s) were performed by non-physician practitioner and as supervising physician I was immediately available for consultation/collaboration. Denecia Brunette, MD, FACEP   Romonia Yanik L Taivon Haroon, MD 05/15/13 1307 

## 2013-05-26 ENCOUNTER — Encounter (HOSPITAL_COMMUNITY): Payer: Self-pay | Admitting: *Deleted

## 2013-05-26 ENCOUNTER — Emergency Department (HOSPITAL_COMMUNITY)
Admission: EM | Admit: 2013-05-26 | Discharge: 2013-05-26 | Disposition: A | Discharge status: Home / Self Care | Payer: BC Managed Care – PPO | Attending: Emergency Medicine | Admitting: Emergency Medicine

## 2013-05-26 DIAGNOSIS — M25569 Pain in unspecified knee: Secondary | ICD-10-CM | POA: Insufficient documentation

## 2013-05-26 DIAGNOSIS — Z9889 Other specified postprocedural states: Secondary | ICD-10-CM | POA: Insufficient documentation

## 2013-05-26 DIAGNOSIS — E669 Obesity, unspecified: Secondary | ICD-10-CM | POA: Insufficient documentation

## 2013-05-26 DIAGNOSIS — Z79899 Other long term (current) drug therapy: Secondary | ICD-10-CM | POA: Insufficient documentation

## 2013-05-26 DIAGNOSIS — M109 Gout, unspecified: Secondary | ICD-10-CM | POA: Insufficient documentation

## 2013-05-26 DIAGNOSIS — Z8719 Personal history of other diseases of the digestive system: Secondary | ICD-10-CM | POA: Insufficient documentation

## 2013-05-26 DIAGNOSIS — M79609 Pain in unspecified limb: Secondary | ICD-10-CM | POA: Insufficient documentation

## 2013-05-26 DIAGNOSIS — M25579 Pain in unspecified ankle and joints of unspecified foot: Secondary | ICD-10-CM | POA: Insufficient documentation

## 2013-05-26 DIAGNOSIS — IMO0002 Reserved for concepts with insufficient information to code with codable children: Secondary | ICD-10-CM | POA: Insufficient documentation

## 2013-05-26 MED ORDER — OXYCODONE-ACETAMINOPHEN 5-325 MG PO TABS
2.0000 | ORAL_TABLET | Freq: Once | ORAL | Status: AC
  Administered 2013-05-26: 2 via ORAL
  Filled 2013-05-26: qty 2

## 2013-05-26 MED ORDER — PREDNISONE 20 MG PO TABS: 60.0000 mg | ORAL_TABLET | Freq: Every day | ORAL | Status: DC

## 2013-05-26 MED ORDER — PREDNISONE 50 MG PO TABS
60.0000 mg | ORAL_TABLET | Freq: Once | ORAL | Status: AC
  Administered 2013-05-26: 60 mg via ORAL
  Filled 2013-05-26: qty 1

## 2013-05-26 MED ORDER — OXYCODONE-ACETAMINOPHEN 5-325 MG PO TABS: 1.0000 | ORAL_TABLET | ORAL | Status: DC | PRN

## 2013-05-26 NOTE — ED Notes (Signed)
Seen here x 1 -2 wks ago for same with relief.  C/o pain from right knee down to right toes starting yesterday.

## 2013-05-26 NOTE — ED Provider Notes (Signed)
CSN: [id number]     Arrival date & time 05/26/13  1828 History     First MD Initiated Contact with Patient 05/26/13 1839     Chief Complaint  Patient presents with  . Gout   (Consider location/radiation/quality/duration/timing/severity/associated sxs/prior Treatment) HPI Comments: John Wallace is a 52 y.o. Male presenting with a flare up of his gout which woke him from sleep yesterday.  He has pain in his right ankle,  His right 5th toe and also his right knee.  He has had previous episodes of gouty attacks in each one of these joints, but has not had pain in all 3 at the same time.  He denies any injuries.  He just returned from vacation where he was fairly sedentary, and he denies any significant change in diet.  He has taken 2 doses of his Indocin last night and again this morning with an in symptoms.  He has run out of this medication, but states usually by the second dose his pain is greatly reduced if it is going to help.  He has taken no other medications.  He does have called to see that home, but does not take as he states this medication does not work.  He denies fevers or chills and there is no radiation of pain.  The history is provided by the patient.    Past Medical History  Diagnosis Date  . Gout   . Obesity   . Diverticulosis    Past Surgical History  Procedure Laterality Date  . Back surgery    . Partial colectomy      Dr Malvin Johns  . Cardiac catheterization     No family history on file. History  Substance Use Topics  . Smoking status: Never Smoker   . Smokeless tobacco: Not on file  . Alcohol Use: No    Review of Systems  Constitutional: Negative for fever and chills.  Musculoskeletal: Positive for joint swelling and arthralgias. Negative for myalgias.  Skin: Negative for color change.  Neurological: Negative for weakness and numbness.    Allergies  Review of patient's allergies indicates no known allergies.  Home Medications   Current Outpatient  Rx  Name  Route  Sig  Dispense  Refill  . Aspirin-Caffeine (BC FAST PAIN RELIEF ARTHRITIS PO)   Oral   Take 1 Package by mouth daily as needed (pain).         . colchicine 0.6 MG tablet   Oral   Take 0.6 mg by mouth daily as needed (gout flare).         Marland Kitchen HYDROcodone-acetaminophen (NORCO) 7.5-325 MG per tablet   Oral   Take 1 tablet by mouth every 4 (four) hours as needed for pain.   20 tablet   0   . ibuprofen (ADVIL,MOTRIN) 200 MG tablet   Oral   Take 800 mg by mouth every 6 (six) hours as needed for pain.         . indomethacin (INDOCIN) 25 MG capsule   Oral   Take 1 capsule (25 mg total) by mouth 3 (three) times daily as needed.   21 capsule   0   . oxyCODONE-acetaminophen (PERCOCET/ROXICET) 5-325 MG per tablet   Oral   Take 1 tablet by mouth every 4 (four) hours as needed for pain.   20 tablet   0   . predniSONE (DELTASONE) 20 MG tablet   Oral   Take 3 tablets (60 mg total) by mouth daily.  12 tablet   0    BP 154/95  Pulse 86  Temp(Src) 98.4 F (36.9 C) (Oral)  Resp 20  Ht 5\' 7"  (1.702 m)  Wt 230 lb (104.327 kg)  BMI 36.01 kg/m2  SpO2 96% Physical Exam  Constitutional: He appears well-developed and well-nourished.  HENT:  Head: Atraumatic.  Neck: Normal range of motion.  Cardiovascular:  Pulses equal bilaterally  Musculoskeletal: He exhibits tenderness.  Patient had tenderness to palpation  along the inferior edge of his right patella without appreciable edema, no erythema.  He does have moderate edema of his bilateral  right ankle, slight increased warmth and erythema.  No edema along the dorsal foot or toes.  Pedal pulses are intact.  Less than 3 second cap refill.  Pain is increased at the ankle with light touch.  skin intact.  Neurological: He is alert. He has normal strength. He displays normal reflexes. No sensory deficit.  Equal strength  Skin: Skin is warm and dry.  Psychiatric: He has a normal mood and affect.    ED Course    Procedures (including critical care time)  Labs Reviewed - No data to display No results found. 1. Gout attack     MDM  Patient with signs and symptoms of gout with prior episode of similar symptoms.  He was placed on prednisone 60 mg pulse dosing for 5 days duration.  His prescribed oxycodone, encouraged elevation and warm compresses.  Followup with his PCP if not improving over the next several days.  There are no physical exam findings suggesting joint space infection.  No history suggesting trauma.  Burgess Amor, PA-C 05/26/13 1936

## 2013-05-27 NOTE — ED Provider Notes (Signed)
Medical screening examination/treatment/procedure(s) were performed by non-physician practitioner and as supervising physician I was immediately available for consultation/collaboration.  Breniyah Romm, MD 05/27/13 1519 

## 2013-12-03 ENCOUNTER — Emergency Department (HOSPITAL_COMMUNITY)
Admission: EM | Admit: 2013-12-03 | Discharge: 2013-12-03 | Disposition: A | Discharge status: Home / Self Care | Payer: BC Managed Care – PPO | Attending: Emergency Medicine | Admitting: Emergency Medicine

## 2013-12-03 ENCOUNTER — Encounter (HOSPITAL_COMMUNITY): Payer: Self-pay | Admitting: Emergency Medicine

## 2013-12-03 DIAGNOSIS — M25571 Pain in right ankle and joints of right foot: Secondary | ICD-10-CM

## 2013-12-03 DIAGNOSIS — Z8719 Personal history of other diseases of the digestive system: Secondary | ICD-10-CM | POA: Insufficient documentation

## 2013-12-03 DIAGNOSIS — M25579 Pain in unspecified ankle and joints of unspecified foot: Secondary | ICD-10-CM | POA: Insufficient documentation

## 2013-12-03 DIAGNOSIS — Z9889 Other specified postprocedural states: Secondary | ICD-10-CM | POA: Insufficient documentation

## 2013-12-03 DIAGNOSIS — E669 Obesity, unspecified: Secondary | ICD-10-CM | POA: Insufficient documentation

## 2013-12-03 DIAGNOSIS — M109 Gout, unspecified: Secondary | ICD-10-CM | POA: Insufficient documentation

## 2013-12-03 MED ORDER — HYDROMORPHONE HCL PF 1 MG/ML IJ SOLN
1.0000 mg | Freq: Once | INTRAMUSCULAR | Status: AC
  Administered 2013-12-03: 1 mg via INTRAMUSCULAR
  Filled 2013-12-03: qty 1

## 2013-12-03 MED ORDER — OXYCODONE-ACETAMINOPHEN 5-325 MG PO TABS: 2.0000 | ORAL_TABLET | ORAL | Status: DC | PRN

## 2013-12-03 MED ORDER — ONDANSETRON 8 MG PO TBDP
8.0000 mg | ORAL_TABLET | Freq: Once | ORAL | Status: AC
  Administered 2013-12-03: 8 mg via ORAL
  Filled 2013-12-03: qty 1

## 2013-12-03 MED ORDER — INDOMETHACIN 25 MG PO CAPS: 25.0000 mg | ORAL_CAPSULE | Freq: Three times a day (TID) | ORAL | Status: DC | PRN

## 2013-12-03 MED ORDER — PREDNISONE 20 MG PO TABS: ORAL_TABLET | ORAL | Status: DC

## 2013-12-03 NOTE — ED Notes (Signed)
Pt c/o right ankle pain. H/s gout. Denies injury.

## 2013-12-03 NOTE — Discharge Instructions (Signed)
Meds for pain, inflammation, prednisone.   Elevate

## 2013-12-03 NOTE — ED Provider Notes (Signed)
CSN: [id number]     Arrival date & time 12/03/13  0730 History   First MD Initiated Contact with Patient 12/03/13 [phone]     Chief Complaint  Patient presents with  . Ankle Pain     (Consider location/radiation/quality/duration/timing/severity/associated sxs/prior Treatment) HPI.... right ankle pain for 24 hours. Patient has a history of gout. No trauma, fever, chills. Severity is moderate. No radiation of pain.  Past Medical History  Diagnosis Date  . Gout   . Obesity   . Diverticulosis    Past Surgical History  Procedure Laterality Date  . Back surgery    . Partial colectomy      Dr Malvin Johns  . Cardiac catheterization    . Throat biop    . Appendectomy     History reviewed. No pertinent family history. History  Substance Use Topics  . Smoking status: Never Smoker   . Smokeless tobacco: Not on file  . Alcohol Use: No    Review of Systems  All other systems reviewed and are negative.      Allergies  Review of patient's allergies indicates no known allergies.  Home Medications   Current Outpatient Rx  Name  Route  Sig  Dispense  Refill  . Aspirin-Caffeine (BC FAST PAIN RELIEF ARTHRITIS PO)   Oral   Take 1 Package by mouth daily as needed (pain).         . colchicine 0.6 MG tablet   Oral   Take 0.6 mg by mouth daily as needed (gout flare).         Marland Kitchen HYDROcodone-acetaminophen (NORCO) 7.5-325 MG per tablet   Oral   Take 1 tablet by mouth every 4 (four) hours as needed for pain.   20 tablet   0   . ibuprofen (ADVIL,MOTRIN) 200 MG tablet   Oral   Take 800 mg by mouth every 6 (six) hours as needed for pain.         . indomethacin (INDOCIN) 25 MG capsule   Oral   Take 1 capsule (25 mg total) by mouth 3 (three) times daily as needed.   21 capsule   0   . indomethacin (INDOCIN) 25 MG capsule   Oral   Take 1 capsule (25 mg total) by mouth 3 (three) times daily as needed.   30 capsule   0   . oxyCODONE-acetaminophen (PERCOCET) 5-325 MG per  tablet   Oral   Take 2 tablets by mouth every 4 (four) hours as needed.   20 tablet   0   . oxyCODONE-acetaminophen (PERCOCET/ROXICET) 5-325 MG per tablet   Oral   Take 1 tablet by mouth every 4 (four) hours as needed for pain.   20 tablet   0   . predniSONE (DELTASONE) 20 MG tablet   Oral   Take 3 tablets (60 mg total) by mouth daily.   12 tablet   0   . predniSONE (DELTASONE) 20 MG tablet      3 tabs po day one, then 2 po daily x 4 days   11 tablet   0    BP 147/98  Pulse 84  Temp(Src) 98.1 F (36.7 C) (Oral)  Resp 16  Ht 5' 7.5" (1.715 m)  Wt 225 lb (102.059 kg)  BMI 34.70 kg/m2  SpO2 99% Physical Exam  Constitutional: He is oriented to person, place, and time. He appears well-developed and well-nourished.  HENT:  Head: Normocephalic.  Musculoskeletal:  Right ankle: No erythema or edema. Slight tenderness  circumferentially  Neurological: He is alert and oriented to person, place, and time.  Skin: Skin is warm and dry.  Psychiatric: He has a normal mood and affect.    ED Course  Procedures (including critical care time) Labs Review Labs Reviewed - No data to display Imaging Review No results found.  EKG Interpretation   None       MDM   Final diagnoses:  Pain in right ankle    Patient has history of gout. Suspect same. Rx indomethacin, Percocet, prednisone.    Donnetta HutchingBrian Jakirah Zaun, MD 12/03/13 [phone]

## 2013-12-30 ENCOUNTER — Emergency Department (HOSPITAL_COMMUNITY)
Admission: EM | Admit: 2013-12-30 | Discharge: 2013-12-31 | Disposition: A | Discharge status: Home / Self Care | Payer: BC Managed Care – PPO | Attending: Emergency Medicine | Admitting: Emergency Medicine

## 2013-12-30 ENCOUNTER — Encounter (HOSPITAL_COMMUNITY): Payer: Self-pay | Admitting: Emergency Medicine

## 2013-12-30 DIAGNOSIS — E669 Obesity, unspecified: Secondary | ICD-10-CM | POA: Insufficient documentation

## 2013-12-30 DIAGNOSIS — Z8719 Personal history of other diseases of the digestive system: Secondary | ICD-10-CM | POA: Insufficient documentation

## 2013-12-30 DIAGNOSIS — M109 Gout, unspecified: Secondary | ICD-10-CM

## 2013-12-30 MED ORDER — OXYCODONE-ACETAMINOPHEN 5-325 MG PO TABS
2.0000 | ORAL_TABLET | Freq: Once | ORAL | Status: AC
  Administered 2013-12-31: 2 via ORAL
  Filled 2013-12-30: qty 2

## 2013-12-30 MED ORDER — PROMETHAZINE HCL 12.5 MG PO TABS
12.5000 mg | ORAL_TABLET | Freq: Once | ORAL | Status: AC
  Administered 2013-12-31: 12.5 mg via ORAL
  Filled 2013-12-30: qty 1

## 2013-12-30 MED ORDER — INDOMETHACIN 25 MG PO CAPS
50.0000 mg | ORAL_CAPSULE | Freq: Once | ORAL | Status: AC
  Administered 2013-12-30: 50 mg via ORAL
  Filled 2013-12-30: qty 2

## 2013-12-30 MED ORDER — PREDNISONE 50 MG PO TABS
60.0000 mg | ORAL_TABLET | Freq: Once | ORAL | Status: AC
  Administered 2013-12-31: 60 mg via ORAL
  Filled 2013-12-30 (×2): qty 1

## 2013-12-30 NOTE — ED Notes (Signed)
Pt reports gout pain to right great toe and left knee x 3 days

## 2013-12-30 NOTE — ED Provider Notes (Signed)
CSN: [id number]     Arrival date & time 12/30/13  2250 History   First MD Initiated Contact with Patient 12/30/13 2313     No chief complaint on file.    (Consider location/radiation/quality/duration/timing/severity/associated sxs/prior Treatment) HPI Comments: Patient is a 53 year old male who presents to the emergency department with complaint of the toe pain. The patient states he has a history of gout. He complains that this problem has been getting progressively worse over the last several months. 3 days ago he began to feel a little" sensitive in his first toe on the right foot" this then got progressively worse. The patient was upstairs in the hospital visiting with his wife when the pain got excruciating, and he can no longer stand the pain in his foot. Patient states this pain feels a lot like his usual gout pain. He's not had any recent fever. He's not had any recent injury to his foot. He tried an Indocin tablet, but this did not help.  The history is provided by the patient.    Past Medical History  Diagnosis Date  . Gout   . Obesity   . Diverticulosis    Past Surgical History  Procedure Laterality Date  . Back surgery    . Partial colectomy      Dr Malvin JohnsBradford  . Cardiac catheterization    . Throat biop    . Appendectomy     No family history on file. History  Substance Use Topics  . Smoking status: Never Smoker   . Smokeless tobacco: Not on file  . Alcohol Use: No    Review of Systems  Constitutional: Negative for activity change.       All ROS Neg except as noted in HPI  HENT: Negative for nosebleeds.   Eyes: Negative for photophobia and discharge.  Respiratory: Negative for cough, shortness of breath and wheezing.   Cardiovascular: Negative for chest pain and palpitations.  Gastrointestinal: Negative for abdominal pain and blood in stool.  Genitourinary: Negative for dysuria, frequency and hematuria.  Musculoskeletal: Positive for arthralgias. Negative for  back pain and neck pain.  Skin: Negative.   Neurological: Negative for dizziness, seizures and speech difficulty.  Psychiatric/Behavioral: Negative for hallucinations and confusion.      Allergies  Review of patient's allergies indicates no known allergies.  Home Medications   Current Outpatient Rx  Name  Route  Sig  Dispense  Refill  . Aspirin-Caffeine (BC FAST PAIN RELIEF ARTHRITIS PO)   Oral   Take 1 Package by mouth daily as needed (pain).         Marland Kitchen. ibuprofen (ADVIL,MOTRIN) 200 MG tablet   Oral   Take 800 mg by mouth every 6 (six) hours as needed for pain.         . indomethacin (INDOCIN) 25 MG capsule   Oral   Take 1 capsule (25 mg total) by mouth 3 (three) times daily as needed.   21 capsule   0   . colchicine 0.6 MG tablet   Oral   Take 0.6 mg by mouth daily as needed (gout flare).         Marland Kitchen. HYDROcodone-acetaminophen (NORCO) 7.5-325 MG per tablet   Oral   Take 1 tablet by mouth every 4 (four) hours as needed for pain.   20 tablet   0   . indomethacin (INDOCIN) 25 MG capsule   Oral   Take 1 capsule (25 mg total) by mouth 3 (three) times daily as needed.  30 capsule   0   . oxyCODONE-acetaminophen (PERCOCET) 5-325 MG per tablet   Oral   Take 2 tablets by mouth every 4 (four) hours as needed.   20 tablet   0   . oxyCODONE-acetaminophen (PERCOCET/ROXICET) 5-325 MG per tablet   Oral   Take 1 tablet by mouth every 4 (four) hours as needed for pain.   20 tablet   0   . predniSONE (DELTASONE) 20 MG tablet   Oral   Take 3 tablets (60 mg total) by mouth daily.   12 tablet   0   . predniSONE (DELTASONE) 20 MG tablet      3 tabs po day one, then 2 po daily x 4 days   11 tablet   0    BP 147/85  Pulse 84  Temp(Src) 97.8 F (36.6 C) (Oral)  Resp 20  Ht 5\' 8"  (1.727 m)  Wt 230 lb (104.327 kg)  BMI 34.98 kg/m2  SpO2 97% Physical Exam  Nursing note and vitals reviewed. Constitutional: He is oriented to person, place, and time. He appears  well-developed and well-nourished.  Non-toxic appearance.  HENT:  Head: Normocephalic.  Right Ear: Tympanic membrane and external ear normal.  Left Ear: Tympanic membrane and external ear normal.  Eyes: EOM and lids are normal. Pupils are equal, round, and reactive to light.  Neck: Normal range of motion. Neck supple. Carotid bruit is not present.  Cardiovascular: Normal rate, regular rhythm, normal heart sounds, intact distal pulses and normal pulses.   Pulmonary/Chest: Breath sounds normal. No respiratory distress.  Abdominal: Soft. Bowel sounds are normal. There is no tenderness. There is no guarding.  Musculoskeletal: He exhibits tenderness.  Pain and mild swelling of the left first toe. The toe is not hot. Pain to even light touch.  Pain with flex/extention of the right knee. Mod crepitus noted. DP pulse 2+ bilat.  Lymphadenopathy:       Head (right side): No submandibular adenopathy present.       Head (left side): No submandibular adenopathy present.    He has no cervical adenopathy.  Neurological: He is alert and oriented to person, place, and time. He has normal strength. No cranial nerve deficit or sensory deficit.  Skin: Skin is warm and dry.  Psychiatric: He has a normal mood and affect. His speech is normal.    ED Course  Procedures (including critical care time) Labs Review Labs Reviewed - No data to display Imaging Review No results found.   EKG Interpretation None      MDM Patient has history of gout. Examination is similar to previous gout attacks. The patient is scheduled to see his primary physician this week. Prescription for prednisone and Percocet given to the patient. Patient has indomethacin at home. Patient advised to elevate his foot and keep it warm. He is strongly advised to keep his appointment for this week.    Final diagnoses:  None    **I have reviewed nursing notes, vital signs, and all appropriate lab and imaging results for this  patient.Kathie Dike, PA-C 12/31/13 [phone]

## 2013-12-31 MED ORDER — OXYCODONE-ACETAMINOPHEN 5-325 MG PO TABS: 1.0000 | ORAL_TABLET | Freq: Four times a day (QID) | ORAL | Status: DC | PRN

## 2013-12-31 MED ORDER — PREDNISONE 10 MG PO TABS: ORAL_TABLET | ORAL | Status: DC

## 2013-12-31 NOTE — ED Provider Notes (Signed)
Medical screening examination/treatment/procedure(s) were performed by non-physician practitioner and as supervising physician I was immediately available for consultation/collaboration.   EKG Interpretation None        Joya Gaskinsonald W Dalonte Hardage, MD 12/31/13 [phone]

## 2013-12-31 NOTE — Discharge Instructions (Signed)
Gout  Gout is when your joints become red, sore, and swell (inflammed). This is caused by the buildup of uric acid crystals in the joints. Uric acid is a chemical that is normally in the blood. If the level of uric acid gets too high in the blood, these crystals form in your joints and tissues. Over time, these crystals can form into masses near the joints and tissues. These masses can destroy bone and cause the bone to look misshapen (deformed).  HOME CARE   · Do not take aspirin for pain.  · Only take medicine as told by your doctor.  · Rest the joint as much as you can. When in bed, keep sheets and blankets off painful areas.  · Keep the sore joints raised (elevated).  · Put warm or cold packs on painful joints. Use of warm or cold packs depends on which works best for you.  · Use crutches if the painful joint is in your leg.  · Drink enough fluids to keep your pee (urine) clear or pale yellow. Limit alcohol, sugary drinks, and drinks with fructose in them.  · Follow your diet instructions. Pay careful attention to how much protein you eat. Include fruits, vegetables, whole grains, and fat-free or low-fat milk products in your daily diet. Talk to your doctor or dietician about the use of coffee, vitamin C, and cherries. These may help lower uric acid levels.  · Keep a healthy body weight.  GET HELP RIGHT AWAY IF:   · You have watery poop (diarrhea), throw up (vomit), or have any side effects from medicines.  · You do not feel better in 24 hours, or you are getting worse.  · Your joint becomes suddenly more tender, and you have chills or a fever.  MAKE SURE YOU:   · Understand these instructions.  · Will watch your condition.  · Will get help right away if you are not doing well or get worse.  Document Released: 07/06/2008 Document Revised: 01/22/2013 Document Reviewed: 01/05/2010  ExitCare® Patient Information ©2014 [employer].

## 2014-03-13 ENCOUNTER — Emergency Department (HOSPITAL_COMMUNITY): Payer: BC Managed Care – PPO

## 2014-03-13 ENCOUNTER — Emergency Department (HOSPITAL_COMMUNITY)
Admission: EM | Admit: 2014-03-13 | Discharge: 2014-03-13 | Disposition: A | Discharge status: Home / Self Care | Payer: BC Managed Care – PPO | Attending: Emergency Medicine | Admitting: Emergency Medicine

## 2014-03-13 ENCOUNTER — Encounter (HOSPITAL_COMMUNITY): Payer: Self-pay | Admitting: Emergency Medicine

## 2014-03-13 DIAGNOSIS — Z792 Long term (current) use of antibiotics: Secondary | ICD-10-CM | POA: Insufficient documentation

## 2014-03-13 DIAGNOSIS — Z79899 Other long term (current) drug therapy: Secondary | ICD-10-CM | POA: Insufficient documentation

## 2014-03-13 DIAGNOSIS — M1610 Unilateral primary osteoarthritis, unspecified hip: Secondary | ICD-10-CM | POA: Insufficient documentation

## 2014-03-13 DIAGNOSIS — Z8719 Personal history of other diseases of the digestive system: Secondary | ICD-10-CM | POA: Insufficient documentation

## 2014-03-13 DIAGNOSIS — Z862 Personal history of diseases of the blood and blood-forming organs and certain disorders involving the immune mechanism: Secondary | ICD-10-CM | POA: Insufficient documentation

## 2014-03-13 DIAGNOSIS — Z8639 Personal history of other endocrine, nutritional and metabolic disease: Secondary | ICD-10-CM | POA: Insufficient documentation

## 2014-03-13 DIAGNOSIS — IMO0002 Reserved for concepts with insufficient information to code with codable children: Secondary | ICD-10-CM | POA: Insufficient documentation

## 2014-03-13 DIAGNOSIS — M169 Osteoarthritis of hip, unspecified: Secondary | ICD-10-CM | POA: Insufficient documentation

## 2014-03-13 DIAGNOSIS — Z9889 Other specified postprocedural states: Secondary | ICD-10-CM | POA: Insufficient documentation

## 2014-03-13 DIAGNOSIS — E669 Obesity, unspecified: Secondary | ICD-10-CM | POA: Insufficient documentation

## 2014-03-13 MED ORDER — INDOMETHACIN 25 MG PO CAPS
25.0000 mg | ORAL_CAPSULE | Freq: Once | ORAL | Status: AC
Start: 2014-03-13 — End: 2014-03-13
  Administered 2014-03-13: 25 mg via ORAL
  Filled 2014-03-13: qty 1

## 2014-03-13 MED ORDER — OXYCODONE-ACETAMINOPHEN 5-325 MG PO TABS: 1.0000 | ORAL_TABLET | Freq: Four times a day (QID) | ORAL | Status: DC | PRN

## 2014-03-13 MED ORDER — OXYCODONE-ACETAMINOPHEN 5-325 MG PO TABS
1.0000 | ORAL_TABLET | Freq: Once | ORAL | Status: AC
  Administered 2014-03-13: 1 via ORAL
  Filled 2014-03-13: qty 1

## 2014-03-13 MED ORDER — DEXAMETHASONE 6 MG PO TABS: ORAL_TABLET | ORAL | Status: DC

## 2014-03-13 MED ORDER — PREDNISONE 50 MG PO TABS
60.0000 mg | ORAL_TABLET | Freq: Once | ORAL | Status: AC
  Administered 2014-03-13: 60 mg via ORAL
  Filled 2014-03-13 (×2): qty 1

## 2014-03-13 MED ORDER — INDOMETHACIN 25 MG PO CAPS: 25.0000 mg | ORAL_CAPSULE | Freq: Three times a day (TID) | ORAL | Status: DC | PRN

## 2014-03-13 NOTE — Discharge Instructions (Signed)
Your xrays are negative for acute problem. 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 Please use your crutches until able to safely walk on the right lower extremity. Please take indocin and decadron with food Hip Pain The hips join the upper legs to the lower pelvis. The bones, cartilage, tendons, and muscles of the hip joint perform a lot of work each day holding your body weight and allowing you to move around. Hip pain is a common symptom. It can range from a minor ache to severe pain on 1 or both hips. Pain may be felt on the inside of the hip joint near the groin, or the outside near the buttocks and upper thigh. There may be swelling or stiffness as well. It occurs more often when a person walks or performs activity. There are many reasons hip pain can develop. CAUSES  It is important to work with your caregiver to identify the cause since many conditions can impact the bones, cartilage, muscles, and tendons of the hips. Causes for hip pain include:  Broken (fractured) bones.  Separation of the thighbone from the hip socket (dislocation).  Torn cartilage of the hip joint.  Swelling (inflammation) of a tendon (tendonitis), the sac within the hip joint (bursitis), or a joint.  A weakening in the abdominal wall (hernia), affecting the nerves to the hip.  Arthritis in the hip joint or lining of the hip joint.  Pinched nerves in the back, hip, or upper thigh.  A bulging disc in the spine (herniated disc).  Rarely, bone infection or cancer. DIAGNOSIS  The location of your hip pain will help your caregiver understand what may be causing the pain. A diagnosis is based on your medical history, your symptoms, results from your physical exam, and results from diagnostic tests. Diagnostic tests may include X-ray exams, a computerized magnetic scan (magnetic resonance imaging, MRI), or bone scan. TREATMENT  Treatment will depend on the cause of your hip pain. Treatment may  include:  Limiting activities and resting until symptoms improve.  Crutches or other walking supports (a cane or brace).  Ice, elevation, and compression.  Physical therapy or home exercises.  Shoe inserts or special shoes.  Losing weight.  Medications to reduce pain.  Undergoing surgery. HOME CARE INSTRUCTIONS   Only take over-the-counter or prescription medicines for pain, discomfort, or fever as directed by your caregiver.  Put ice on the injured area:  Put ice in a plastic bag.  Place a towel between your skin and the bag.  Leave the ice on for 15-20 minutes at a time, 03-04 times a day.  Keep your leg raised (elevated) when possible to lessen swelling.  Avoid activities that cause pain.  Follow specific exercises as directed by your caregiver.  Sleep with a pillow between your legs on your most comfortable side.  Record how often you have hip pain, the location of the pain, and what it feels like. This information may be helpful to you and your caregiver.  Ask your caregiver about returning to work or sports and whether you should drive.  Follow up with your caregiver for further exams, therapy, or testing as directed. SEEK MEDICAL CARE IF:   Your pain or swelling continues or worsens after 1 week.  You are feeling unwell or have chills.  You have increasing difficulty with walking.  You have a loss of sensation or other new symptoms.  You have questions or concerns. SEEK IMMEDIATE MEDICAL CARE IF:  You cannot put weight on the affected hip.  You have fallen.  You have a sudden increase in pain and swelling in your hip.  You have a fever. MAKE SURE YOU:   Understand these instructions.  Will watch your condition.  Will get help right away if you are not doing well or get worse. Document Released: 03/17/2010 Document Revised: 12/20/2011 Document Reviewed: 03/17/2010 [facility] Patient Information 2014 [place], [place].

## 2014-03-13 NOTE — ED Provider Notes (Signed)
Medical screening examination/treatment/procedure(s) were performed by non-physician practitioner and as supervising physician I was immediately available for consultation/collaboration.   EKG Interpretation None        Simcha Speir L Aydin Hink, MD 03/13/14 2143 

## 2014-03-13 NOTE — ED Provider Notes (Signed)
CSN: [id number]     Arrival date & time 03/13/14  1500 History   First MD Initiated Contact with Patient 03/13/14 1533     Chief Complaint  Patient presents with  . Knee Pain    rt     (Consider location/radiation/quality/duration/timing/severity/associated sxs/prior Treatment) Patient is a 53 y.o. male presenting with knee pain. The history is provided by the patient.  Knee Pain Location:  Hip Time since incident:  1 week Hip location:  R hip Pain details:    Quality:  Aching   Radiates to:  R leg   Severity:  Moderate   Onset quality:  Gradual   Duration:  1 week   Timing:  Intermittent   Progression:  Worsening Chronicity:  Chronic Dislocation: no   Foreign body present:  Unable to specify Relieved by:  Nothing Worsened by:  Bearing weight Ineffective treatments:  None tried Associated symptoms: back pain and stiffness   Associated symptoms: no fever, no neck pain and no numbness   Risk factors: no recent illness     Past Medical History  Diagnosis Date  . Gout   . Obesity   . Diverticulosis    Past Surgical History  Procedure Laterality Date  . Back surgery    . Partial colectomy      Dr Malvin Johns  . Cardiac catheterization    . Throat biop    . Appendectomy     History reviewed. No pertinent family history. History  Substance Use Topics  . Smoking status: Never Smoker   . Smokeless tobacco: Not on file  . Alcohol Use: No    Review of Systems  Constitutional: Negative for fever and activity change.       All ROS Neg except as noted in HPI  HENT: Negative for nosebleeds.   Eyes: Negative for photophobia and discharge.  Respiratory: Negative for cough, shortness of breath and wheezing.   Cardiovascular: Negative for chest pain and palpitations.  Gastrointestinal: Negative for abdominal pain and blood in stool.  Genitourinary: Negative for dysuria, frequency and hematuria.  Musculoskeletal: Positive for arthralgias, back pain and stiffness. Negative  for neck pain.  Skin: Negative.   Neurological: Negative for dizziness, seizures and speech difficulty.  Psychiatric/Behavioral: Negative for hallucinations and confusion.      Allergies  Review of patient's allergies indicates no known allergies.  Home Medications   Prior to Admission medications   Medication Sig Start Date End Date Taking? Authorizing Provider  Aspirin-Caffeine (BC FAST PAIN RELIEF ARTHRITIS PO) Take 1 Package by mouth daily as needed (pain).    Historical Provider, MD  colchicine 0.6 MG tablet Take 0.6 mg by mouth daily as needed (gout flare).    Historical Provider, MD  HYDROcodone-acetaminophen (NORCO) 7.5-325 MG per tablet Take 1 tablet by mouth every 4 (four) hours as needed for pain. 05/07/13   Kathie Dike, PA-C  ibuprofen (ADVIL,MOTRIN) 200 MG tablet Take 800 mg by mouth every 6 (six) hours as needed for pain.    Historical Provider, MD  indomethacin (INDOCIN) 25 MG capsule Take 1 capsule (25 mg total) by mouth 3 (three) times daily as needed. 05/07/13   Kathie Dike, PA-C  indomethacin (INDOCIN) 25 MG capsule Take 1 capsule (25 mg total) by mouth 3 (three) times daily as needed. 12/03/13   Donnetta Hutching, MD  oxyCODONE-acetaminophen (PERCOCET) 5-325 MG per tablet Take 2 tablets by mouth every 4 (four) hours as needed. 12/03/13   Donnetta Hutching, MD  oxyCODONE-acetaminophen (PERCOCET/ROXICET) [id number]  MG per tablet Take 1 tablet by mouth every 4 (four) hours as needed for pain. 05/26/13   Burgess AmorJulie Idol, PA-C  oxyCODONE-acetaminophen (PERCOCET/ROXICET) 5-325 MG per tablet Take 1 tablet by mouth every 6 (six) hours as needed for severe pain. 12/31/13   Kathie DikeHobson M Hena Ewalt, PA-C  predniSONE (DELTASONE) 10 MG tablet 5,4,3,2,1 - take with food 12/31/13   Kathie DikeHobson M Joaovictor Krone, PA-C  predniSONE (DELTASONE) 20 MG tablet Take 3 tablets (60 mg total) by mouth daily. 05/26/13   Burgess AmorJulie Idol, PA-C  predniSONE (DELTASONE) 20 MG tablet 3 tabs po day one, then 2 po daily x 4 days 12/03/13   Donnetta HutchingBrian Cook, MD    BP 158/72  Pulse 78  Temp(Src) 98.1 F (36.7 C) (Oral)  Resp 16  Ht 5' 7.5" (1.715 m)  Wt 220 lb (99.791 kg)  BMI 33.93 kg/m2  SpO2 98% Physical Exam  Nursing note and vitals reviewed. Constitutional: He is oriented to person, place, and time. He appears well-developed and well-nourished.  Non-toxic appearance.  HENT:  Head: Normocephalic.  Right Ear: Tympanic membrane and external ear normal.  Left Ear: Tympanic membrane and external ear normal.  Eyes: EOM and lids are normal. Pupils are equal, round, and reactive to light.  Neck: Normal range of motion. Neck supple. Carotid bruit is not present.  Cardiovascular: Normal rate, regular rhythm, normal heart sounds, intact distal pulses and normal pulses.   Pulmonary/Chest: Breath sounds normal. No respiratory distress.  Abdominal: Soft. Bowel sounds are normal. There is no tenderness. There is no guarding.  Musculoskeletal: Normal range of motion.  Lymphadenopathy:       Head (right side): No submandibular adenopathy present.       Head (left side): No submandibular adenopathy present.    He has no cervical adenopathy.  Neurological: He is alert and oriented to person, place, and time. He has normal strength. No cranial nerve deficit or sensory deficit.  Skin: Skin is warm and dry.  Psychiatric: He has a normal mood and affect. His speech is normal.    ED Course  Procedures (including critical care time) Labs Review Labs Reviewed - No data to display  Imaging Review No results found.   EKG Interpretation None      MDM X-ray of the right hip and right knee are negative for acute changes. No fracture dislocation noted. Some degenerative joint disease changes are noted.  Suspect the patient is having an inflammatory event involving the right hip and also the right knee. Patient states that this does not feel like his usual gout attack.  The plan at this time is for the patient to use his crutches when he gets back home  he has a pair at home. Prescription for indomethacin and Percocet given to the patient. As well as prednisone taper. The patient is to followup with his primary physician for recheck and additional management. Patient knowledge is understanding of this discharge plan.    Final diagnoses:  None    *I have reviewed nursing notes, vital signs, and all appropriate lab and imaging results for this patient.Kathie Dike**    Alexandr Oehler M Veryl Winemiller, PA-C 03/13/14 1928

## 2014-03-13 NOTE — ED Notes (Addendum)
Pt states rt hip started hurting 1 week ago and has started hurting in rt leg down to knee. Denies injury. States the pain is in his rt knee mostly.

## 2014-06-29 ENCOUNTER — Encounter (HOSPITAL_COMMUNITY): Payer: Self-pay | Admitting: Emergency Medicine

## 2014-06-29 ENCOUNTER — Emergency Department (HOSPITAL_COMMUNITY)
Admission: EM | Admit: 2014-06-29 | Discharge: 2014-06-29 | Disposition: A | Discharge status: Home / Self Care | Payer: BC Managed Care – PPO | Attending: Emergency Medicine | Admitting: Emergency Medicine

## 2014-06-29 DIAGNOSIS — M7989 Other specified soft tissue disorders: Secondary | ICD-10-CM | POA: Diagnosis present

## 2014-06-29 DIAGNOSIS — E669 Obesity, unspecified: Secondary | ICD-10-CM | POA: Diagnosis not present

## 2014-06-29 DIAGNOSIS — Z8719 Personal history of other diseases of the digestive system: Secondary | ICD-10-CM | POA: Insufficient documentation

## 2014-06-29 DIAGNOSIS — M25561 Pain in right knee: Secondary | ICD-10-CM

## 2014-06-29 DIAGNOSIS — M109 Gout, unspecified: Secondary | ICD-10-CM | POA: Diagnosis not present

## 2014-06-29 DIAGNOSIS — Z79899 Other long term (current) drug therapy: Secondary | ICD-10-CM | POA: Diagnosis not present

## 2014-06-29 DIAGNOSIS — M25569 Pain in unspecified knee: Secondary | ICD-10-CM | POA: Insufficient documentation

## 2014-06-29 MED ORDER — OXYCODONE-ACETAMINOPHEN 5-325 MG PO TABS: 2.0000 | ORAL_TABLET | ORAL | Status: DC | PRN

## 2014-06-29 MED ORDER — PREDNISONE 50 MG PO TABS: ORAL_TABLET | ORAL | Status: DC

## 2014-06-29 MED ORDER — PREDNISONE 10 MG PO TABS
60.0000 mg | ORAL_TABLET | Freq: Once | ORAL | Status: AC
  Administered 2014-06-29: 60 mg via ORAL
  Filled 2014-06-29 (×2): qty 1

## 2014-06-29 MED ORDER — OXYCODONE-ACETAMINOPHEN 5-325 MG PO TABS
2.0000 | ORAL_TABLET | Freq: Once | ORAL | Status: AC
  Administered 2014-06-29: 2 via ORAL
  Filled 2014-06-29: qty 2

## 2014-06-29 NOTE — ED Provider Notes (Signed)
CSN: [id number]     Arrival date & time 06/29/14  2204 History  This chart was scribed for Joya Gaskins, MD by Modena Jansky, ED Scribe. This patient was seen in room APA09/APA09 and the patient's care was started at 11:09 PM.    Chief Complaint  Patient presents with  . Leg Swelling   Patient is a 53 y.o. male presenting with leg pain. The history is provided by the patient. No language interpreter was used.  Leg Pain Location:  Leg Injury: no   Leg location:  R leg Pain details:    Quality:  Aching   Radiates to:  Does not radiate   Severity:  Moderate   Onset quality:  Gradual   Duration:  2 days   Timing:  Constant   Progression:  Worsening Chronicity:  Chronic Dislocation: no   Foreign body present:  No foreign bodies Associated symptoms: no fever    HPI Comments: John Wallace is a 53 y.o. male with a hx of gout who presents to the Emergency Department complaining of constant moderate right knee pain that started yesterday morning. He states that he thought the pain was due to his gout so he took some indocin without relief. He reports that later on he elevated his leg and noticed the pain got worse along with associated swelling. He reports no injury. He denies any hx of blood clot or leg surgery. He denies any recent travels. He also denies any fever, emesis, chest pain, or SOB.   Past Medical History  Diagnosis Date  . Gout   . Obesity   . Diverticulosis    Past Surgical History  Procedure Laterality Date  . Back surgery    . Partial colectomy      Dr Malvin Johns  . Cardiac catheterization    . Throat biop    . Appendectomy     History reviewed. No pertinent family history. History  Substance Use Topics  . Smoking status: Never Smoker   . Smokeless tobacco: Not on file  . Alcohol Use: No    Review of Systems  Constitutional: Negative for fever.  Respiratory: Negative for shortness of breath.   Cardiovascular: Positive for leg swelling. Negative for  chest pain.  Gastrointestinal: Negative for vomiting.  Musculoskeletal: Positive for myalgias.  All other systems reviewed and are negative.   Allergies  Review of patient's allergies indicates no known allergies.  Home Medications   Prior to Admission medications   Medication Sig Start Date End Date Taking? Authorizing Provider  Aspirin-Caffeine (BC FAST PAIN RELIEF ARTHRITIS PO) Take 1 Package by mouth daily as needed (pain).    Historical Provider, MD  colchicine 0.6 MG tablet Take 0.6 mg by mouth daily as needed (gout flare).    Historical Provider, MD  ibuprofen (ADVIL,MOTRIN) 200 MG tablet Take 800 mg by mouth every 6 (six) hours as needed for pain.    Historical Provider, MD  indomethacin (INDOCIN) 25 MG capsule Take 1 capsule (25 mg total) by mouth 3 (three) times daily as needed. 03/13/14   Kathie Dike, PA-C   BP 142/79  Pulse 75  Temp(Src) 97.7 F (36.5 C) (Oral)  Resp 20  Ht  (1.727 m)  Wt 235 lb (106.595 kg)  BMI 35.74 kg/m2  SpO2 95% Physical Exam CONSTITUTIONAL: Well developed/well nourished HEAD: Normocephalic/atraumatic ENMT: Mucous membranes moist NECK: supple no meningeal signs CV: S1/S2 noted, no murmurs/rubs/gallops noted LUNGS: Lungs are clear to auscultation bilaterally, no apparent distress  ABDOMEN: soft, nontender, no rebound or guarding NEURO: Pt is awake/alert, moves all extremitiesx4 EXTREMITIES: pulses normal, full ROM, TTP right medial knee, no erythema noted, minimal knee effusion noted, no calf tenderness or edema  SKIN: warm, color normal PSYCH: no abnormalities of mood noted  ED Course  Procedures  DIAGNOSTIC STUDIES: Oxygen Saturation is 95% on RA, normal by my interpretation.    COORDINATION OF CARE: 11:13 PM- Pt advised of plan for treatment which includes medication and pt agrees.   Pt reports right knee pain/swelling similar to prior gout but not improved with indocin.  He may have early joint effusion but not amenable to  arthrocentesis.  I doubt septic arthritis.  Will try oral steroids (denies h/o Diabetes)  Also given oral pain meds  We discussed strict return precautions Appropriate for d/c home   MDM   Final diagnoses:  Pain in right knee  Gouty arthritis    Nursing notes including past medical history and social history reviewed and considered in documentation  I personally performed the services described in this documentation, which was scribed in my presence. The recorded information has been reviewed and is accurate.       Joya Gaskins, MD 06/29/14 [phone]

## 2014-06-29 NOTE — ED Notes (Signed)
Pt c/o pain and swelling to right knee that started earlier today

## 2014-06-29 NOTE — Discharge Instructions (Signed)

## 2014-07-10 ENCOUNTER — Ambulatory Visit (HOSPITAL_COMMUNITY)
Admission: RE | Admit: 2014-07-10 | Discharge: 2014-07-10 | Disposition: A | Discharge status: Home / Self Care | Payer: BC Managed Care – PPO | Source: Ambulatory Visit | Attending: Physician Assistant | Admitting: Physician Assistant

## 2014-07-10 ENCOUNTER — Other Ambulatory Visit (HOSPITAL_COMMUNITY): Payer: Self-pay | Admitting: Physician Assistant

## 2014-07-10 DIAGNOSIS — M109 Gout, unspecified: Secondary | ICD-10-CM | POA: Insufficient documentation

## 2014-07-10 DIAGNOSIS — M25569 Pain in unspecified knee: Secondary | ICD-10-CM | POA: Diagnosis present

## 2014-09-22 ENCOUNTER — Encounter (HOSPITAL_COMMUNITY): Payer: Self-pay | Admitting: *Deleted

## 2014-09-22 DIAGNOSIS — Z79899 Other long term (current) drug therapy: Secondary | ICD-10-CM | POA: Insufficient documentation

## 2014-09-22 DIAGNOSIS — Z8719 Personal history of other diseases of the digestive system: Secondary | ICD-10-CM | POA: Insufficient documentation

## 2014-09-22 DIAGNOSIS — Z9889 Other specified postprocedural states: Secondary | ICD-10-CM | POA: Diagnosis not present

## 2014-09-22 DIAGNOSIS — M109 Gout, unspecified: Secondary | ICD-10-CM | POA: Diagnosis present

## 2014-09-22 DIAGNOSIS — E669 Obesity, unspecified: Secondary | ICD-10-CM | POA: Diagnosis not present

## 2014-09-22 DIAGNOSIS — Z791 Long term (current) use of non-steroidal anti-inflammatories (NSAID): Secondary | ICD-10-CM | POA: Diagnosis not present

## 2014-09-22 DIAGNOSIS — M10062 Idiopathic gout, left knee: Secondary | ICD-10-CM | POA: Insufficient documentation

## 2014-09-22 NOTE — ED Notes (Signed)
Pt states he is suffering from an extreme case of gout in his left knee and his meds are not working.

## 2014-09-23 ENCOUNTER — Emergency Department (HOSPITAL_COMMUNITY)
Admission: EM | Admit: 2014-09-23 | Discharge: 2014-09-23 | Disposition: A | Discharge status: Home / Self Care | Payer: BC Managed Care – PPO | Attending: Emergency Medicine | Admitting: Emergency Medicine

## 2014-09-23 DIAGNOSIS — M10062 Idiopathic gout, left knee: Secondary | ICD-10-CM

## 2014-09-23 MED ORDER — PREDNISONE 50 MG PO TABS
60.0000 mg | ORAL_TABLET | Freq: Once | ORAL | Status: AC
  Administered 2014-09-23: 60 mg via ORAL
  Filled 2014-09-23 (×2): qty 1

## 2014-09-23 MED ORDER — OXYCODONE-ACETAMINOPHEN 5-325 MG PO TABS: 1.0000 | ORAL_TABLET | ORAL | Status: DC | PRN

## 2014-09-23 MED ORDER — PREDNISONE 50 MG PO TABS: 50.0000 mg | ORAL_TABLET | Freq: Every day | ORAL | Status: DC

## 2014-09-23 MED ORDER — OXYCODONE-ACETAMINOPHEN 5-325 MG PO TABS
1.0000 | ORAL_TABLET | Freq: Once | ORAL | Status: AC
Start: 2014-09-23 — End: 2014-09-23
  Administered 2014-09-23: 1 via ORAL
  Filled 2014-09-23: qty 1

## 2014-09-23 NOTE — Discharge Instructions (Signed)
Talk with your doctor about whether you should be on a higher dose of allopurinol, or whether Uloric would be better for you.   Gout Gout is an inflammatory arthritis caused by a buildup of uric acid crystals in the joints. Uric acid is a chemical that is normally present in the blood. When the level of uric acid in the blood is too high it can form crystals that deposit in your joints and tissues. This causes joint redness, soreness, and swelling (inflammation). Repeat attacks are common. Over time, uric acid crystals can form into masses (tophi) near a joint, destroying bone and causing disfigurement. Gout is treatable and often preventable. CAUSES  The disease begins with elevated levels of uric acid in the blood. Uric acid is produced by your body when it breaks down a naturally found substance called purines. Certain foods you eat, such as meats and fish, contain high amounts of purines. Causes of an elevated uric acid level include:  Being passed down from parent to child (heredity).  Diseases that cause increased uric acid production (such as obesity, psoriasis, and certain cancers).  Excessive alcohol use.  Diet, especially diets rich in meat and seafood.  Medicines, including certain cancer-fighting medicines (chemotherapy), water pills (diuretics), and aspirin.  Chronic kidney disease. The kidneys are no longer able to remove uric acid well.  Problems with metabolism. Conditions strongly associated with gout include:  Obesity.  High blood pressure.  High cholesterol.  Diabetes. Not everyone with elevated uric acid levels gets gout. It is not understood why some people get gout and others do not. Surgery, joint injury, and eating too much of certain foods are some of the factors that can lead to gout attacks. SYMPTOMS   An attack of gout comes on quickly. It causes intense pain with redness, swelling, and warmth in a joint.  Fever can occur.  Often, only one joint is  involved. Certain joints are more commonly involved:  Base of the big toe.  Knee.  Ankle.  Wrist.  Finger. Without treatment, an attack usually goes away in a few days to weeks. Between attacks, you usually will not have symptoms, which is different from many other forms of arthritis. DIAGNOSIS  Your caregiver will suspect gout based on your symptoms and exam. In some cases, tests may be recommended. The tests may include:  Blood tests.  Urine tests.  X-rays.  Joint fluid exam. This exam requires a needle to remove fluid from the joint (arthrocentesis). Using a microscope, gout is confirmed when uric acid crystals are seen in the joint fluid. TREATMENT  There are two phases to gout treatment: treating the sudden onset (acute) attack and preventing attacks (prophylaxis).  Treatment of an Acute Attack.  Medicines are used. These include anti-inflammatory medicines or steroid medicines.  An injection of steroid medicine into the affected joint is sometimes necessary.  The painful joint is rested. Movement can worsen the arthritis.  You may use warm or cold treatments on painful joints, depending which works best for you.  Treatment to Prevent Attacks.  If you suffer from frequent gout attacks, your caregiver may advise preventive medicine. These medicines are started after the acute attack subsides. These medicines either help your kidneys eliminate uric acid from your body or decrease your uric acid production. You may need to stay on these medicines for a very long time.  The early phase of treatment with preventive medicine can be associated with an increase in acute gout attacks. For this reason,  during the first few months of treatment, your caregiver may also advise you to take medicines usually used for acute gout treatment. Be sure you understand your caregiver's directions. Your caregiver may make several adjustments to your medicine dose before these medicines are  effective.  Discuss dietary treatment with your caregiver or dietitian. Alcohol and drinks high in sugar and fructose and foods such as meat, poultry, and seafood can increase uric acid levels. Your caregiver or dietitian can advise you on drinks and foods that should be limited. HOME CARE INSTRUCTIONS   Do not take aspirin to relieve pain. This raises uric acid levels.  Only take over-the-counter or prescription medicines for pain, discomfort, or fever as directed by your caregiver.  Rest the joint as much as possible. When in bed, keep sheets and blankets off painful areas.  Keep the affected joint raised (elevated).  Apply warm or cold treatments to painful joints. Use of warm or cold treatments depends on which works best for you.  Use crutches if the painful joint is in your leg.  Drink enough fluids to keep your urine clear or pale yellow. This helps your body get rid of uric acid. Limit alcohol, sugary drinks, and fructose drinks.  Follow your dietary instructions. Pay careful attention to the amount of protein you eat. Your daily diet should emphasize fruits, vegetables, whole grains, and fat-free or low-fat milk products. Discuss the use of coffee, vitamin C, and cherries with your caregiver or dietitian. These may be helpful in lowering uric acid levels.  Maintain a healthy body weight. SEEK MEDICAL CARE IF:   You develop diarrhea, vomiting, or any side effects from medicines.  You do not feel better in 24 hours, or you are getting worse. SEEK IMMEDIATE MEDICAL CARE IF:   Your joint becomes suddenly more tender, and you have chills or a fever. MAKE SURE YOU:   Understand these instructions.  Will watch your condition.  Will get help right away if you are not doing well or get worse. Document Released: 09/24/2000 Document Revised: 02/11/2014 Document Reviewed: 05/10/2012 [facility] Patient Information 2015 [place], [place]. This information is not intended to replace  advice given to you by your health care provider. Make sure you discuss any questions you have with your health care provider.  Prednisone tablets What is this medicine? PREDNISONE (PRED ni sone) is a corticosteroid. It is commonly used to treat inflammation of the skin, joints, lungs, and other organs. Common conditions treated include asthma, allergies, and arthritis. It is also used for other conditions, such as blood disorders and diseases of the adrenal glands. This medicine may be used for other purposes; ask your health care provider or pharmacist if you have questions. COMMON BRAND NAME(S): Deltasone, Predone, Sterapred, Sterapred DS What should I tell my health care provider before I take this medicine? They need to know if you have any of these conditions: -Cushing's syndrome -diabetes -glaucoma -heart disease -high blood pressure -infection (especially a virus infection such as chickenpox, cold sores, or herpes) -kidney disease -liver disease -mental illness -myasthenia gravis -osteoporosis -seizures -stomach or intestine problems -thyroid disease -an unusual or allergic reaction to lactose, prednisone, other medicines, foods, dyes, or preservatives -pregnant or trying to get pregnant -breast-feeding How should I use this medicine? Take this medicine by mouth with a glass of water. Follow the directions on the prescription label. Take this medicine with food. If you are taking this medicine once a day, take it in the morning. Do not take  more medicine than you are told to take. Do not suddenly stop taking your medicine because you may develop a severe reaction. Your doctor will tell you how much medicine to take. If your doctor wants you to stop the medicine, the dose may be slowly lowered over time to avoid any side effects. Talk to your pediatrician regarding the use of this medicine in children. Special care may be needed. Overdosage: If you think you have taken too much of  this medicine contact a poison control center or emergency room at once. NOTE: This medicine is only for you. Do not share this medicine with others. What if I miss a dose? If you miss a dose, take it as soon as you can. If it is almost time for your next dose, talk to your doctor or health care professional. You may need to miss a dose or take an extra dose. Do not take double or extra doses without advice. What may interact with this medicine? Do not take this medicine with any of the following medications: -metyrapone -mifepristone This medicine may also interact with the following medications: -aminoglutethimide -amphotericin B -aspirin and aspirin-like medicines -barbiturates -certain medicines for diabetes, like glipizide or glyburide -cholestyramine -cholinesterase inhibitors -cyclosporine -digoxin -diuretics -ephedrine -male hormones, like estrogens and birth control pills -isoniazid -ketoconazole -NSAIDS, medicines for pain and inflammation, like ibuprofen or naproxen -phenytoin -rifampin -toxoids -vaccines -warfarin This list may not describe all possible interactions. Give your health care provider a list of all the medicines, herbs, non-prescription drugs, or dietary supplements you use. Also tell them if you smoke, drink alcohol, or use illegal drugs. Some items may interact with your medicine. What should I watch for while using this medicine? Visit your doctor or health care professional for regular checks on your progress. If you are taking this medicine over a prolonged period, carry an identification card with your name and address, the type and dose of your medicine, and your doctor's name and address. This medicine may increase your risk of getting an infection. Tell your doctor or health care professional if you are around anyone with measles or chickenpox, or if you develop sores or blisters that do not heal properly. If you are going to have surgery, tell  your doctor or health care professional that you have taken this medicine within the last twelve months. Ask your doctor or health care professional about your diet. You may need to lower the amount of salt you eat. This medicine may affect blood sugar levels. If you have diabetes, check with your doctor or health care professional before you change your diet or the dose of your diabetic medicine. What side effects may I notice from receiving this medicine? Side effects that you should report to your doctor or health care professional as soon as possible: -allergic reactions like skin rash, itching or hives, swelling of the face, lips, or tongue -changes in emotions or moods -changes in vision -depressed mood -eye pain -fever or chills, cough, sore throat, pain or difficulty passing urine -increased thirst -swelling of ankles, feet Side effects that usually do not require medical attention (report to your doctor or health care professional if they continue or are bothersome): -confusion, excitement, restlessness -headache -nausea, vomiting -skin problems, acne, thin and shiny skin -trouble sleeping -weight gain This list may not describe all possible side effects. Call your doctor for medical advice about side effects. You may report side effects to FDA at 1-800-FDA-1088. Where should I keep my medicine?  Keep out of the reach of children. Store at room temperature between 15 and 30 degrees C (59 and 86 degrees F). Protect from light. Keep container tightly closed. Throw away any unused medicine after the expiration date. NOTE: This sheet is a summary. It may not cover all possible information. If you have questions about this medicine, talk to your doctor, pharmacist, or health care provider.  2015, [employer] Standard. (2011-05-13 10:57:14)  Acetaminophen; Oxycodone tablets What is this medicine? ACETAMINOPHEN; OXYCODONE (a set a MEE noe fen; ox i KOE done) is a pain reliever. It is  used to treat mild to moderate pain. This medicine may be used for other purposes; ask your health care provider or pharmacist if you have questions. COMMON BRAND NAME(S): Endocet, Magnacet, Narvox, Percocet, Perloxx, Primalev, Primlev, Roxicet, Xolox What should I tell my health care provider before I take this medicine? They need to know if you have any of these conditions: -brain tumor -Crohn's disease, inflammatory bowel disease, or ulcerative colitis -drug abuse or addiction -head injury -heart or circulation problems -if you often drink alcohol -kidney disease or problems going to the bathroom -liver disease -lung disease, asthma, or breathing problems -an unusual or allergic reaction to acetaminophen, oxycodone, other opioid analgesics, other medicines, foods, dyes, or preservatives -pregnant or trying to get pregnant -breast-feeding How should I use this medicine? Take this medicine by mouth with a full glass of water. Follow the directions on the prescription label. Take your medicine at regular intervals. Do not take your medicine more often than directed. Talk to your pediatrician regarding the use of this medicine in children. Special care may be needed. Patients over 53 years old may have a stronger reaction and need a smaller dose. Overdosage: If you think you have taken too much of this medicine contact a poison control center or emergency room at once. NOTE: This medicine is only for you. Do not share this medicine with others. What if I miss a dose? If you miss a dose, take it as soon as you can. If it is almost time for your next dose, take only that dose. Do not take double or extra doses. What may interact with this medicine? -alcohol -antihistamines -barbiturates like amobarbital, butalbital, butabarbital, methohexital, pentobarbital, phenobarbital, thiopental, and secobarbital -benztropine -drugs for bladder problems like solifenacin, trospium, oxybutynin,  tolterodine, hyoscyamine, and methscopolamine -drugs for breathing problems like ipratropium and tiotropium -drugs for certain stomach or intestine problems like propantheline, homatropine methylbromide, glycopyrrolate, atropine, belladonna, and dicyclomine -general anesthetics like etomidate, ketamine, nitrous oxide, propofol, desflurane, enflurane, halothane, isoflurane, and sevoflurane -medicines for depression, anxiety, or psychotic disturbances -medicines for sleep -muscle relaxants -naltrexone -narcotic medicines (opiates) for pain -phenothiazines like perphenazine, thioridazine, chlorpromazine, mesoridazine, fluphenazine, prochlorperazine, promazine, and trifluoperazine -scopolamine -tramadol -trihexyphenidyl This list may not describe all possible interactions. Give your health care provider a list of all the medicines, herbs, non-prescription drugs, or dietary supplements you use. Also tell them if you smoke, drink alcohol, or use illegal drugs. Some items may interact with your medicine. What should I watch for while using this medicine? Tell your doctor or health care professional if your pain does not go away, if it gets worse, or if you have new or a different type of pain. You may develop tolerance to the medicine. Tolerance means that you will need a higher dose of the medication for pain relief. Tolerance is normal and is expected if you take this medicine for a long time. Do not suddenly stop  taking your medicine because you may develop a severe reaction. Your body becomes used to the medicine. This does NOT mean you are addicted. Addiction is a behavior related to getting and using a drug for a non-medical reason. If you have pain, you have a medical reason to take pain medicine. Your doctor will tell you how much medicine to take. If your doctor wants you to stop the medicine, the dose will be slowly lowered over time to avoid any side effects. You may get drowsy or dizzy. Do not  drive, use machinery, or do anything that needs mental alertness until you know how this medicine affects you. Do not stand or sit up quickly, especially if you are an older patient. This reduces the risk of dizzy or fainting spells. Alcohol may interfere with the effect of this medicine. Avoid alcoholic drinks. There are different types of narcotic medicines (opiates) for pain. If you take more than one type at the same time, you may have more side effects. Give your health care provider a list of all medicines you use. Your doctor will tell you how much medicine to take. Do not take more medicine than directed. Call emergency for help if you have problems breathing. The medicine will cause constipation. Try to have a bowel movement at least every 2 to 3 days. If you do not have a bowel movement for 3 days, call your doctor or health care professional. Do not take Tylenol (acetaminophen) or medicines that have acetaminophen with this medicine. Too much acetaminophen can be very dangerous. Many nonprescription medicines contain acetaminophen. Always read the labels carefully to avoid taking more acetaminophen. What side effects may I notice from receiving this medicine? Side effects that you should report to your doctor or health care professional as soon as possible: -allergic reactions like skin rash, itching or hives, swelling of the face, lips, or tongue -breathing difficulties, wheezing -confusion -light headedness or fainting spells -severe stomach pain -unusually weak or tired -yellowing of the skin or the whites of the eyes Side effects that usually do not require medical attention (report to your doctor or health care professional if they continue or are bothersome): -dizziness -drowsiness -nausea -vomiting This list may not describe all possible side effects. Call your doctor for medical advice about side effects. You may report side effects to FDA at 1-800-FDA-1088. Where should I keep  my medicine? Keep out of the reach of children. This medicine can be abused. Keep your medicine in a safe place to protect it from theft. Do not share this medicine with anyone. Selling or giving away this medicine is dangerous and against the law. Store at room temperature between 20 and 25 degrees C (68 and 77 degrees F). Keep container tightly closed. Protect from light. This medicine may cause accidental overdose and death if it is taken by other adults, children, or pets. Flush any unused medicine down the toilet to reduce the chance of harm. Do not use the medicine after the expiration date. NOTE: This sheet is a summary. It may not cover all possible information. If you have questions about this medicine, talk to your doctor, pharmacist, or health care provider.  2015, [employer] Standard. (2013-05-21 13:17:35)

## 2014-09-23 NOTE — ED Provider Notes (Signed)
CSN: [id number]     Arrival date & time 09/22/14  2307 History  This chart was scribed for Dione Boozeavid Corney Knighton, MD by Milly JakobJohn Lee Graves, ED Scribe. The patient was seen in room APA18/APA18. Patient's care was started at 12:19 AM.    Chief Complaint  Patient presents with  . Gout   The history is provided by the patient. No language interpreter was used.   HPI Comments: John Wallace is a 53 y.o. male with a history of gout, obesity, and diverticulosis who presents to the Emergency Department complaining of a gout flare up causing constant, aching, pain in his left knee. He states that he has taken 5, 50 mg pills of Indocin without relief. He states that his pain is exacerbated by bending and palpation. He states that it is mildly relieved by elevation. His previous flare up was in his right knee 1-1.5 months ago. He denies any allergies.  PCP: Colette RibasGOLDING, JOHN CABOT, MD  Past Medical History  Diagnosis Date  . Gout   . Obesity   . Diverticulosis    Past Surgical History  Procedure Laterality Date  . Back surgery    . Partial colectomy      Dr Malvin JohnsBradford  . Cardiac catheterization    . Throat biop    . Appendectomy     History reviewed. No pertinent family history. History  Substance Use Topics  . Smoking status: Never Smoker   . Smokeless tobacco: Not on file  . Alcohol Use: No    Review of Systems  Musculoskeletal: Positive for arthralgias (left knee gout flare).  All other systems reviewed and are negative.  Allergies  Review of patient's allergies indicates no known allergies.  Home Medications   Prior to Admission medications   Medication Sig Start Date End Date Taking? Authorizing Provider  allopurinol (ZYLOPRIM) 100 MG tablet Take 100 mg by mouth daily.   Yes Historical Provider, MD  indomethacin (INDOCIN) 50 MG capsule Take 50 mg by mouth 2 (two) times daily with a meal.   Yes Historical Provider, MD   Triage Vitals: BP 156/94 mmHg  Pulse 74  Temp(Src) 98.8 F (37.1 C)  (Oral)  Resp 20  Ht 5\' 8"  (1.727 m)  Wt 235 lb (106.595 kg)  BMI 35.74 kg/m2  SpO2 98% Physical Exam  Constitutional: He is oriented to person, place, and time. He appears well-developed and well-nourished. No distress.  HENT:  Head: Normocephalic and atraumatic.  Eyes: Conjunctivae and EOM are normal. Pupils are equal, round, and reactive to light.  Neck: Normal range of motion. Neck supple. No JVD present.  Cardiovascular: Normal rate, regular rhythm and normal heart sounds.   No murmur heard. Pulmonary/Chest: Effort normal and breath sounds normal. No respiratory distress. He has no wheezes. He has no rales. He exhibits no tenderness.  Abdominal: Soft. Bowel sounds are normal. He exhibits no mass. There is no tenderness. There is no guarding.  Musculoskeletal: Normal range of motion. He exhibits no edema.  Moderate effusion of the left knee w/ tenderness consistent with gout.  Lymphadenopathy:    He has no cervical adenopathy.  Neurological: He is alert and oriented to person, place, and time. He has normal reflexes. No cranial nerve deficit. Coordination normal.  Skin: Skin is warm and dry. No rash noted.  Psychiatric: He has a normal mood and affect. His behavior is normal. Thought content normal.  Nursing note and vitals reviewed.   ED Course  Procedures (including critical care time) DIAGNOSTIC  STUDIES: Oxygen Saturation is 98% on room air, normal by my interpretation.    COORDINATION OF CARE: 12:26 AM-Discussed treatment plan which includes pain medication and prednisone with pt at bedside and pt agreed to plan.   Labs Review Labs Reviewed - No data to display  Imaging Review No results found.   EKG Interpretation None      MDM   Final diagnoses:  Acute idiopathic gout of left knee    Acute gouty arthritis of the left knee. Old records are reviewed and he has prior ED visits for attacks of gout. He was recently started on allopurinol 100 mg. It is worrisome  that he had an acute attack so soon after being started on the allopurinol. I suggested to him that he may benefit from being on a higher dose. Alternatively, he may benefit from being on Uloric instead of allopurinol. He is to discuss this with his PCP. Treatment of the acute attack is initiated with prednisone and oxycodone-acetaminophen. He is sent home with prescriptions for same.   I personally performed the services described in this documentation, which was scribed in my presence. The recorded information has been reviewed and is accurate.    Dione Boozeavid Marice Guidone, MD 09/23/14 [phone]

## 2014-09-23 NOTE — ED Notes (Signed)
Pt alert & oriented x4. Patient given discharge instructions, paperwork & prescription(s). Patient instructed to stop at the registration desk to finish any additional paperwork. Patient verbalized understanding. Pt left department in wheelchair w/ no further questions. 

## 2014-10-07 MED FILL — Oxycodone w/ Acetaminophen Tab 5-325 MG: ORAL | Qty: 6 | Status: AC

## 2015-01-01 ENCOUNTER — Emergency Department (HOSPITAL_COMMUNITY): Payer: BLUE CROSS/BLUE SHIELD

## 2015-01-01 ENCOUNTER — Emergency Department (HOSPITAL_COMMUNITY)
Admission: EM | Admit: 2015-01-01 | Discharge: 2015-01-01 | Disposition: A | Discharge status: Home / Self Care | Payer: BLUE CROSS/BLUE SHIELD | Attending: Emergency Medicine | Admitting: Emergency Medicine

## 2015-01-01 ENCOUNTER — Encounter (HOSPITAL_COMMUNITY): Payer: Self-pay | Admitting: *Deleted

## 2015-01-01 DIAGNOSIS — N202 Calculus of kidney with calculus of ureter: Secondary | ICD-10-CM | POA: Diagnosis not present

## 2015-01-01 DIAGNOSIS — E669 Obesity, unspecified: Secondary | ICD-10-CM | POA: Insufficient documentation

## 2015-01-01 DIAGNOSIS — Z9049 Acquired absence of other specified parts of digestive tract: Secondary | ICD-10-CM | POA: Insufficient documentation

## 2015-01-01 DIAGNOSIS — Z79899 Other long term (current) drug therapy: Secondary | ICD-10-CM | POA: Insufficient documentation

## 2015-01-01 DIAGNOSIS — Z9889 Other specified postprocedural states: Secondary | ICD-10-CM | POA: Diagnosis not present

## 2015-01-01 DIAGNOSIS — M109 Gout, unspecified: Secondary | ICD-10-CM | POA: Insufficient documentation

## 2015-01-01 DIAGNOSIS — R1032 Left lower quadrant pain: Secondary | ICD-10-CM

## 2015-01-01 DIAGNOSIS — N200 Calculus of kidney: Secondary | ICD-10-CM

## 2015-01-01 DIAGNOSIS — Z791 Long term (current) use of non-steroidal anti-inflammatories (NSAID): Secondary | ICD-10-CM | POA: Insufficient documentation

## 2015-01-01 LAB — URINALYSIS, ROUTINE W REFLEX MICROSCOPIC
Bilirubin Urine: NEGATIVE
Glucose, UA: NEGATIVE mg/dL
Ketones, ur: NEGATIVE mg/dL
LEUKOCYTES UA: NEGATIVE
Nitrite: NEGATIVE
PH: 6 (ref 5.0–8.0)
PROTEIN: NEGATIVE mg/dL
Specific Gravity, Urine: 1.01 (ref 1.005–1.030)
Urobilinogen, UA: 0.2 mg/dL (ref 0.0–1.0)

## 2015-01-01 LAB — URINE MICROSCOPIC-ADD ON

## 2015-01-01 LAB — COMPREHENSIVE METABOLIC PANEL
ALBUMIN: 3.8 g/dL (ref 3.5–5.2)
ALK PHOS: 66 U/L (ref 39–117)
ALT: 34 U/L (ref 0–53)
ANION GAP: 7 (ref 5–15)
AST: 25 U/L (ref 0–37)
BUN: 18 mg/dL (ref 6–23)
CHLORIDE: 106 mmol/L (ref 96–112)
CO2: 27 mmol/L (ref 19–32)
Calcium: 9.1 mg/dL (ref 8.4–10.5)
Creatinine, Ser: 1.02 mg/dL (ref 0.50–1.35)
GFR calc Af Amer: >90 mL/min (ref >90)
GFR calc non Af Amer: 81 mL/min — ABNORMAL LOW (ref >90)
Glucose, Bld: 120 mg/dL — ABNORMAL HIGH (ref 70–99)
POTASSIUM: 3.9 mmol/L (ref 3.5–5.1)
SODIUM: 140 mmol/L (ref 135–145)
Total Bilirubin: 0.3 mg/dL (ref 0.3–1.2)
Total Protein: 7.7 g/dL (ref 6.0–8.3)

## 2015-01-01 LAB — CBC WITH DIFFERENTIAL/PLATELET
BASOS ABS: 0.1 10*3/uL (ref 0.0–0.1)
Basophils Relative: 1 % (ref 0–1)
EOS PCT: 1 % (ref 0–5)
Eosinophils Absolute: 0.1 10*3/uL (ref 0.0–0.7)
HCT: 45.3 % (ref 39.0–52.0)
Hemoglobin: 15.1 g/dL (ref 13.0–17.0)
LYMPHS ABS: 1.8 10*3/uL (ref 0.7–4.0)
Lymphocytes Relative: 16 % (ref 12–46)
MCH: 29.1 pg (ref 26.0–34.0)
MCHC: 33.3 g/dL (ref 30.0–36.0)
MCV: 87.3 fL (ref 78.0–100.0)
MONO ABS: 0.9 10*3/uL (ref 0.1–1.0)
MONOS PCT: 8 % (ref 3–12)
NEUTROS PCT: 74 % (ref 43–77)
Neutro Abs: 8.3 10*3/uL — ABNORMAL HIGH (ref 1.7–7.7)
Platelets: 238 10*3/uL (ref 150–400)
RBC: 5.19 MIL/uL (ref 4.22–5.81)
RDW: 13.3 % (ref 11.5–15.5)
WBC: 11.2 10*3/uL — AB (ref 4.0–10.5)

## 2015-01-01 LAB — LIPASE, BLOOD: Lipase: 23 U/L (ref 11–59)

## 2015-01-01 MED ORDER — SODIUM CHLORIDE 0.9 % IV BOLUS (SEPSIS)
500.0000 mL | Freq: Once | INTRAVENOUS | Status: AC
  Administered 2015-01-01: 500 mL via INTRAVENOUS

## 2015-01-01 MED ORDER — OXYCODONE-ACETAMINOPHEN 5-325 MG PO TABS: 1.0000 | ORAL_TABLET | ORAL | Status: DC | PRN

## 2015-01-01 MED ORDER — KETOROLAC TROMETHAMINE 10 MG PO TABS: 10.0000 mg | ORAL_TABLET | Freq: Four times a day (QID) | ORAL | Status: DC | PRN

## 2015-01-01 MED ORDER — IOHEXOL 300 MG/ML  SOLN
100.0000 mL | Freq: Once | INTRAMUSCULAR | Status: AC | PRN
  Administered 2015-01-01: 100 mL via INTRAVENOUS

## 2015-01-01 MED ORDER — TAMSULOSIN HCL 0.4 MG PO CAPS: 0.4000 mg | ORAL_CAPSULE | Freq: Every day | ORAL | Status: DC

## 2015-01-01 MED ORDER — HYDROMORPHONE HCL 1 MG/ML IJ SOLN
1.0000 mg | Freq: Once | INTRAMUSCULAR | Status: AC
  Administered 2015-01-01: 1 mg via INTRAVENOUS
  Filled 2015-01-01: qty 1

## 2015-01-01 MED ORDER — KETOROLAC TROMETHAMINE 30 MG/ML IJ SOLN
30.0000 mg | Freq: Once | INTRAMUSCULAR | Status: AC
  Administered 2015-01-01: 30 mg via INTRAVENOUS
  Filled 2015-01-01: qty 1

## 2015-01-01 NOTE — ED Provider Notes (Signed)
CSN: [id number]     Arrival date & time 01/01/15  1612 History   First MD Initiated Contact with Patient 01/01/15 1627     No chief complaint on file.    (Consider location/radiation/quality/duration/timing/severity/associated sxs/prior Treatment) HPI.... Left lower quadrant pain radiating to the left flank for several days. Symptoms disappeared a couple of days ago and then returned again. Normal urinary output. No hematuria, dysuria, fever, chills. Patient has history of kidney stones. He is also status post a distal colectomy for complications from diverticulitis. Severity of pain is moderate. Nothing makes symptoms better or worse  Past Medical History  Diagnosis Date  . Gout   . Obesity   . Diverticulosis    Past Surgical History  Procedure Laterality Date  . Back surgery    . Partial colectomy      Dr Malvin JohnsBradford  . Cardiac catheterization    . Throat biop    . Appendectomy     No family history on file. History  Substance Use Topics  . Smoking status: Never Smoker   . Smokeless tobacco: Not on file  . Alcohol Use: No    Review of Systems  All other systems reviewed and are negative.     Allergies  Review of patient's allergies indicates no known allergies.  Home Medications   Prior to Admission medications   Medication Sig Start Date End Date Taking? Authorizing Provider  indomethacin (INDOCIN) 50 MG capsule Take 50 mg by mouth 2 (two) times daily with a meal.   Yes Historical Provider, MD  ketorolac (TORADOL) 10 MG tablet Take 1 tablet (10 mg total) by mouth every 6 (six) hours as needed. 01/01/15   Donnetta HutchingBrian Laure Leone, MD  oxyCODONE-acetaminophen (PERCOCET) 5-325 MG per tablet Take 1-2 tablets by mouth every 4 (four) hours as needed. 01/01/15   Donnetta HutchingBrian Dartha Rozzell, MD  predniSONE (DELTASONE) 50 MG tablet Take 1 tablet (50 mg total) by mouth daily. Patient not taking: Reported on 01/01/2015 09/23/14   Dione Boozeavid Glick, MD  tamsulosin (FLOMAX) 0.4 MG CAPS capsule Take 1 capsule (0.4 mg  total) by mouth daily. 01/01/15   Donnetta HutchingBrian Cung Masterson, MD   BP 161/109 mmHg  Pulse 76  Temp(Src) 98.8 F (37.1 C) (Oral)  Resp 18  Ht 5\' 8"  (1.727 m)  Wt 235 lb (106.595 kg)  BMI 35.74 kg/m2  SpO2 93% Physical Exam  Constitutional: He is oriented to person, place, and time. He appears well-developed and well-nourished.  HENT:  Head: Normocephalic and atraumatic.  Eyes: Conjunctivae and EOM are normal. Pupils are equal, round, and reactive to light.  Neck: Normal range of motion. Neck supple.  Cardiovascular: Normal rate and regular rhythm.   Pulmonary/Chest: Effort normal and breath sounds normal.  Abdominal: Soft. Bowel sounds are normal.  Minimal left lower quadrant tenderness  Musculoskeletal: Normal range of motion.  Neurological: He is alert and oriented to person, place, and time.  Skin: Skin is warm and dry.  Psychiatric: He has a normal mood and affect. His behavior is normal.  Nursing note and vitals reviewed.   ED Course  Procedures (including critical care time) Labs Review Labs Reviewed  CBC WITH DIFFERENTIAL/PLATELET - Abnormal; Notable for the following:    WBC 11.2 (*)    Neutro Abs 8.3 (*)    All other components within normal limits  COMPREHENSIVE METABOLIC PANEL - Abnormal; Notable for the following:    Glucose, Bld 120 (*)    GFR calc non Af Amer 81 (*)    All  other components within normal limits  URINALYSIS, ROUTINE W REFLEX MICROSCOPIC - Abnormal; Notable for the following:    Hgb urine dipstick LARGE (*)    All other components within normal limits  LIPASE, BLOOD  URINE MICROSCOPIC-ADD ON    Imaging Review Ct Abdomen Pelvis W Contrast  01/01/2015   CLINICAL DATA:  Left-sided mid abdominal pain left-sided flank pain for 3 days  EXAM: CT ABDOMEN AND PELVIS WITH CONTRAST  TECHNIQUE: Multidetector CT imaging of the abdomen and pelvis was performed using the standard protocol following bolus administration of intravenous contrast.  CONTRAST:  OMNIPAQUE  IOHEXOL 300 MG/ML  SOLN  COMPARISON:  Feb 16, 2008  FINDINGS: There is mild atelectatic change in the anterior left base. There is minimal atelectatic change in the posterior right base.  There is diffuse hepatic steatosis. No focal liver lesions are identified. Gallbladder wall is not thickened. There is no biliary duct dilatation.  Spleen, pancreas, and adrenals appear normal.  The right kidney shows no mass or hydronephrosis. There is no right-sided renal or ureteral calculus.  On the left, there is a 1.1 x 1.1 cm cyst in the medial lower pole kidney region. There is mild hydronephrosis. There is no intrarenal calculus. There is a focal calculus measuring 3 mm in the left ureter at the level of the upper left acetabulum. No other ureteral calculus is appreciable.  In the pelvis, the urinary bladder is decompressed. There may be mild fold thickening in the urinary bladder. There are scattered prostatic calculi. There is no pelvic mass or pelvic fluid collection. Appendix is absent. Postoperative change involving the sigmoid colon is noted with the anastomosis patent.  There is no bowel obstruction. No free air or portal venous air. There is no appreciable ascites, adenopathy, or abscess in the abdomen or pelvis. There is no demonstrable abdominal aortic aneurysm. There are no blastic or lytic bone lesions.  IMPRESSION: 3 mm calculus in the left ureter at the superior left acetabular level with mild hydronephrosis on the left.  Appendix absent.  Question a degree of cystitis. There are scattered prostatic calculi.  Postoperative change involving the sigmoid colon with anastomosis patent. No bowel obstruction. No abscess.  Diffuse hepatic steatosis.   Electronically Signed   By: Bretta Bang III M.D.   On: 01/01/2015 17:34     EKG Interpretation None      MDM   Final diagnoses:  LLQ pain  Kidney stone on left side    CT scan reveals a 3 mm calculus in the left ureter proximally.  No evidence of  diverticulitis. Explained findings to patient and wife. Referral to urologist. Discharge medications Percocet, Toradol 10 mg, Flomax 0.4 mg    Donnetta Hutching, MD 01/01/15 1925

## 2015-01-01 NOTE — ED Notes (Signed)
Abdominal pain radiating around left flank towards the back.  Denies N/V/D or difficulty urinatingAlso states sinus infection and bilateral ear pain. NAD

## 2015-01-01 NOTE — Discharge Instructions (Signed)
You have a 3 mm kidney stone on the left side. 2 prescriptions for pain and one prescription to increase urinary flow. Follow-up with urologist if not getting better. Phone number given.

## 2015-01-04 ENCOUNTER — Emergency Department (HOSPITAL_COMMUNITY): Payer: BLUE CROSS/BLUE SHIELD

## 2015-01-04 ENCOUNTER — Encounter (HOSPITAL_COMMUNITY): Payer: Self-pay | Admitting: *Deleted

## 2015-01-04 ENCOUNTER — Emergency Department (HOSPITAL_COMMUNITY)
Admission: EM | Admit: 2015-01-04 | Discharge: 2015-01-05 | Disposition: A | Discharge status: Home / Self Care | Payer: BLUE CROSS/BLUE SHIELD | Attending: Emergency Medicine | Admitting: Emergency Medicine

## 2015-01-04 DIAGNOSIS — Z8719 Personal history of other diseases of the digestive system: Secondary | ICD-10-CM | POA: Diagnosis not present

## 2015-01-04 DIAGNOSIS — N201 Calculus of ureter: Secondary | ICD-10-CM | POA: Insufficient documentation

## 2015-01-04 DIAGNOSIS — Z8739 Personal history of other diseases of the musculoskeletal system and connective tissue: Secondary | ICD-10-CM | POA: Diagnosis not present

## 2015-01-04 DIAGNOSIS — Z791 Long term (current) use of non-steroidal anti-inflammatories (NSAID): Secondary | ICD-10-CM | POA: Diagnosis not present

## 2015-01-04 DIAGNOSIS — Z79899 Other long term (current) drug therapy: Secondary | ICD-10-CM | POA: Diagnosis not present

## 2015-01-04 DIAGNOSIS — E669 Obesity, unspecified: Secondary | ICD-10-CM | POA: Insufficient documentation

## 2015-01-04 DIAGNOSIS — Z9889 Other specified postprocedural states: Secondary | ICD-10-CM | POA: Diagnosis not present

## 2015-01-04 DIAGNOSIS — R109 Unspecified abdominal pain: Secondary | ICD-10-CM | POA: Diagnosis present

## 2015-01-04 MED ORDER — SODIUM CHLORIDE 0.9 % IV SOLN
1000.0000 mL | INTRAVENOUS | Status: DC
  Administered 2015-01-05: 1000 mL via INTRAVENOUS

## 2015-01-04 MED ORDER — ONDANSETRON HCL 4 MG/2ML IJ SOLN
4.0000 mg | Freq: Once | INTRAMUSCULAR | Status: AC
  Administered 2015-01-04: 4 mg via INTRAVENOUS
  Filled 2015-01-04: qty 2

## 2015-01-04 MED ORDER — SODIUM CHLORIDE 0.9 % IV SOLN
1000.0000 mL | Freq: Once | INTRAVENOUS | Status: AC
  Administered 2015-01-04: 1000 mL via INTRAVENOUS

## 2015-01-04 MED ORDER — HYDROMORPHONE HCL 1 MG/ML IJ SOLN
1.0000 mg | Freq: Once | INTRAMUSCULAR | Status: AC
  Administered 2015-01-04: 1 mg via INTRAVENOUS
  Filled 2015-01-04: qty 1

## 2015-01-04 NOTE — ED Notes (Addendum)
Pt states he is having left sided flank pain. Pt states he was seen here earlier this week for the same. Last BM was on Wed., March 23rd.

## 2015-01-04 NOTE — ED Notes (Addendum)
Pt c/o intermittent left flank pain, lower abd pain that has been intermittent since Wednesday, was seen in er for same, states that he was diagnosed with kidney stones, was seen by Dr Sherwood GamblerFusco on Friday, given antibiotics, a shot while in office for swollen lymph noted, pt concerned because his brother past away a few months ago with cancer, denies any burning with urination,

## 2015-01-04 NOTE — ED Provider Notes (Signed)
CSN: [id number]     Arrival date & time 01/04/15  2201 History  This chart was scribed for John Booze, MD by Modena Jansky, ED Scribe. This patient was seen in room APA14/APA14 and the patient's care was started at 11:17 PM.   Chief Complaint  Patient presents with  . Flank Pain   The history is provided by the patient. No language interpreter was used.   HPI Comments: John Wallace is a 54 y.o. male who presents to the Emergency Department complaining of intermittent moderate left suprapubic abdominal pain that started 3 days ago. He states that his pain started with a kidney stone 3 days ago,  and then was gone after he passed the stone 2 days ago. He reports that the pain came back yesterday so he went to doctor and received a shot with temporary relief. He states that today was his first BM since 3 days ago. He reports that the pain radiates to his left flank. He reports having nausea onset when his pain starts. He currently rates the severity of his pain as a 8/10 and a 10/10 at its worst. He states that he has been taking oxycodone and toradol without any relief. He denies any fever or chills.   Past Medical History  Diagnosis Date  . Gout   . Obesity   . Diverticulosis    Past Surgical History  Procedure Laterality Date  . Back surgery    . Partial colectomy      Dr Malvin Johns  . Cardiac catheterization    . Throat biop    . Appendectomy     History reviewed. No pertinent family history. History  Substance Use Topics  . Smoking status: Never Smoker   . Smokeless tobacco: Not on file  . Alcohol Use: No    Review of Systems  Constitutional: Negative for fever and chills.  Gastrointestinal: Positive for nausea and abdominal pain.  Genitourinary: Positive for flank pain.  All other systems reviewed and are negative.   Allergies  Review of patient's allergies indicates no known allergies.  Home Medications   Prior to Admission medications   Medication Sig Start Date  End Date Taking? Authorizing Provider  indomethacin (INDOCIN) 50 MG capsule Take 50 mg by mouth 2 (two) times daily with a meal.    Historical Provider, MD  ketorolac (TORADOL) 10 MG tablet Take 1 tablet (10 mg total) by mouth every 6 (six) hours as needed. 01/01/15   Donnetta Hutching, MD  oxyCODONE-acetaminophen (PERCOCET) 5-325 MG per tablet Take 1-2 tablets by mouth every 4 (four) hours as needed. 01/01/15   Donnetta Hutching, MD  predniSONE (DELTASONE) 50 MG tablet Take 1 tablet (50 mg total) by mouth daily. Patient not taking: Reported on 01/01/2015 09/23/14   John Booze, MD  tamsulosin (FLOMAX) 0.4 MG CAPS capsule Take 1 capsule (0.4 mg total) by mouth daily. 01/01/15   Donnetta Hutching, MD   BP 151/93 mmHg  Pulse 77  Temp(Src) 98.7 F (37.1 C)  Resp 20  Ht  (1.727 m)  Wt 252 lb (114.306 kg)  BMI 38.33 kg/m2  SpO2 95% Physical Exam  Constitutional: He is oriented to person, place, and time. He appears well-developed and well-nourished. No distress.  HENT:  Head: Normocephalic and atraumatic.  Eyes: EOM are normal. Pupils are equal, round, and reactive to light.  Neck: Normal range of motion. Neck supple. No JVD present.  Cardiovascular: Normal rate, regular rhythm and normal heart sounds.   No murmur  heard. Pulmonary/Chest: Effort normal and breath sounds normal. No respiratory distress. He has no wheezes. He has no rales.  Abdominal: Soft. He exhibits no mass. There is tenderness. There is no rebound and no guarding.  Mild left CVA tenderness. Decreased bowel sounds.   Musculoskeletal: Normal range of motion. He exhibits no edema.  Lymphadenopathy:    He has no cervical adenopathy.  Neurological: He is alert and oriented to person, place, and time. No cranial nerve deficit. He exhibits normal muscle tone. Coordination normal.  Skin: Skin is warm and dry. No rash noted.  Psychiatric: He has a normal mood and affect. His behavior is normal. Judgment and thought content normal.  Nursing note and  vitals reviewed.   ED Course  Procedures (including critical care time) DIAGNOSTIC STUDIES: Oxygen Saturation is 95% on RA, normal by my interpretation.    COORDINATION OF CARE: 11:21 PM- Pt advised of plan for treatment which includes medication, radiology, and labs and pt agrees.  Labs Review Results for orders placed or performed during the hospital encounter of 01/04/15  Basic metabolic panel  Result Value Ref Range   Sodium 139 135 - 145 mmol/L   Potassium 3.8 3.5 - 5.1 mmol/L   Chloride 104 96 - 112 mmol/L   CO2 28 19 - 32 mmol/L   Glucose, Bld 163 (H) 70 - 99 mg/dL   BUN 19 6 - 23 mg/dL   Creatinine, Ser 8.460.94 0.50 - 1.35 mg/dL   Calcium 8.8 8.4 - 96.210.5 mg/dL   GFR calc non Af Amer >90 >90 mL/min   GFR calc Af Amer >90 >90 mL/min   Anion gap 7 5 - 15  CBC with Differential  Result Value Ref Range   WBC 8.8 4.0 - 10.5 K/uL   RBC 4.73 4.22 - 5.81 MIL/uL   Hemoglobin 13.6 13.0 - 17.0 g/dL   HCT 95.241.0 84.139.0 - 32.452.0 %   MCV 86.7 78.0 - 100.0 fL   MCH 28.8 26.0 - 34.0 pg   MCHC 33.2 30.0 - 36.0 g/dL   RDW 40.113.2 02.711.5 - 25.315.5 %   Platelets 225 150 - 400 K/uL   Neutrophils Relative % 69 43 - 77 %   Neutro Abs 6.2 1.7 - 7.7 K/uL   Lymphocytes Relative 17 12 - 46 %   Lymphs Abs 1.5 0.7 - 4.0 K/uL   Monocytes Relative 10 3 - 12 %   Monocytes Absolute 0.9 0.1 - 1.0 K/uL   Eosinophils Relative 3 0 - 5 %   Eosinophils Absolute 0.2 0.0 - 0.7 K/uL   Basophils Relative 1 0 - 1 %   Basophils Absolute 0.0 0.0 - 0.1 K/uL  Urinalysis, Routine w reflex microscopic  Result Value Ref Range   Color, Urine YELLOW YELLOW   APPearance CLEAR CLEAR   Specific Gravity, Urine >1.030 (H) 1.005 - 1.030   pH 5.5 5.0 - 8.0   Glucose, UA NEGATIVE NEGATIVE mg/dL   Hgb urine dipstick LARGE (A) NEGATIVE   Bilirubin Urine NEGATIVE NEGATIVE   Ketones, ur TRACE (A) NEGATIVE mg/dL   Protein, ur NEGATIVE NEGATIVE mg/dL   Urobilinogen, UA 0.2 0.0 - 1.0 mg/dL   Nitrite NEGATIVE NEGATIVE   Leukocytes, UA  NEGATIVE NEGATIVE  Urine microscopic-add on  Result Value Ref Range   Squamous Epithelial / LPF RARE RARE   WBC, UA 3-6 <3 WBC/hpf   RBC / HPF 11-20 <3 RBC/hpf   Bacteria, UA FEW (A) RARE    Imaging Review Dg Abd  1 View  01/05/2015   CLINICAL DATA:  Intermittent left flank pain for 10 days.  EXAM: ABDOMEN - 1 VIEW  COMPARISON:  CT 01/01/2015  FINDINGS: Small calcification projects in the left lower pelvis, possibly the distal left ureteral stone seen on prior CT. Warrant scratch head inferior to this calcification is a second calcification which likely represents a phlebolith. No additional calcifications. Nonobstructive bowel gas pattern. No acute bony abnormality.  IMPRESSION: Distal left ureteral stone likely remains present. This may have migrated distally slightly since prior CT.   Electronically Signed   By: Charlett Nose M.D.   On: 01/05/2015 00:26    Images viewed by me.   MDM   Final diagnoses:  Ureterolithiasis    Left abdominal and flank pain consistent with ureteral colic. Old records reviewed he was seen 3 days ago and did have a left ureteral calculus at that time. I have reviewed the images and a calculus is visible on the topogram. KUB will be obtained to assess the stone has moved.  He had good relief of pain with intravenous hydromorphone. X-ray shows that the calculus has not moved significantly. He is discharged with prescription for a higher strength of oxycodone-acetaminophen and is referred to urology for follow-up. He may be a candidate for cystoscopy with basket stone retrieval.  I personally performed the services described in this documentation, which was scribed in my presence. The recorded information has been reviewed and is accurate.       John Booze, MD 01/05/15 [phone]

## 2015-01-05 LAB — BASIC METABOLIC PANEL
Anion gap: 7 (ref 5–15)
BUN: 19 mg/dL (ref 6–23)
CALCIUM: 8.8 mg/dL (ref 8.4–10.5)
CHLORIDE: 104 mmol/L (ref 96–112)
CO2: 28 mmol/L (ref 19–32)
Creatinine, Ser: 0.94 mg/dL (ref 0.50–1.35)
GLUCOSE: 163 mg/dL — AB (ref 70–99)
POTASSIUM: 3.8 mmol/L (ref 3.5–5.1)
Sodium: 139 mmol/L (ref 135–145)

## 2015-01-05 LAB — CBC WITH DIFFERENTIAL/PLATELET
Basophils Absolute: 0 10*3/uL (ref 0.0–0.1)
Basophils Relative: 1 % (ref 0–1)
EOS ABS: 0.2 10*3/uL (ref 0.0–0.7)
Eosinophils Relative: 3 % (ref 0–5)
HEMATOCRIT: 41 % (ref 39.0–52.0)
Hemoglobin: 13.6 g/dL (ref 13.0–17.0)
LYMPHS ABS: 1.5 10*3/uL (ref 0.7–4.0)
Lymphocytes Relative: 17 % (ref 12–46)
MCH: 28.8 pg (ref 26.0–34.0)
MCHC: 33.2 g/dL (ref 30.0–36.0)
MCV: 86.7 fL (ref 78.0–100.0)
MONOS PCT: 10 % (ref 3–12)
Monocytes Absolute: 0.9 10*3/uL (ref 0.1–1.0)
NEUTROS ABS: 6.2 10*3/uL (ref 1.7–7.7)
Neutrophils Relative %: 69 % (ref 43–77)
Platelets: 225 10*3/uL (ref 150–400)
RBC: 4.73 MIL/uL (ref 4.22–5.81)
RDW: 13.2 % (ref 11.5–15.5)
WBC: 8.8 10*3/uL (ref 4.0–10.5)

## 2015-01-05 LAB — URINALYSIS, ROUTINE W REFLEX MICROSCOPIC
BILIRUBIN URINE: NEGATIVE
Glucose, UA: NEGATIVE mg/dL
Leukocytes, UA: NEGATIVE
Nitrite: NEGATIVE
PROTEIN: NEGATIVE mg/dL
Urobilinogen, UA: 0.2 mg/dL (ref 0.0–1.0)
pH: 5.5 (ref 5.0–8.0)

## 2015-01-05 LAB — URINE MICROSCOPIC-ADD ON

## 2015-01-05 MED ORDER — OXYCODONE-ACETAMINOPHEN 7.5-325 MG PO TABS: 1.0000 | ORAL_TABLET | ORAL | Status: DC | PRN

## 2015-01-05 NOTE — Discharge Instructions (Signed)
Kidney Stones °Kidney stones (urolithiasis) are deposits that form inside your kidneys. The intense pain is caused by the stone moving through the urinary tract. When the stone moves, the ureter goes into spasm around the stone. The stone is usually passed in the urine.  °CAUSES  °· A disorder that makes certain neck glands produce too much parathyroid hormone (primary hyperparathyroidism). °· A buildup of uric acid crystals, similar to gout in your joints. °· Narrowing (stricture) of the ureter. °· A kidney obstruction present at birth (congenital obstruction). °· Previous surgery on the kidney or ureters. °· Numerous kidney infections. °SYMPTOMS  °· Feeling sick to your stomach (nauseous). °· Throwing up (vomiting). °· Blood in the urine (hematuria). °· Pain that usually spreads (radiates) to the groin. °· Frequency or urgency of urination. °DIAGNOSIS  °· Taking a history and physical exam. °· Blood or urine tests. °· CT scan. °· Occasionally, an examination of the inside of the urinary bladder (cystoscopy) is performed. °TREATMENT  °· Observation. °· Increasing your fluid intake. °· Extracorporeal shock wave lithotripsy--This is a noninvasive procedure that uses shock waves to break up kidney stones. °· Surgery may be needed if you have severe pain or persistent obstruction. There are various surgical procedures. Most of the procedures are performed with the use of small instruments. Only small incisions are needed to accommodate these instruments, so recovery time is minimized. °The size, location, and chemical composition are all important variables that will determine the proper choice of action for you. Talk to your health care provider to better understand your situation so that you will minimize the risk of injury to yourself and your kidney.  °HOME CARE INSTRUCTIONS  °· Drink enough water and fluids to keep your urine clear or pale yellow. This will help you to pass the stone or stone fragments. °· Strain  all urine through the provided strainer. Keep all particulate matter and stones for your health care provider to see. The stone causing the pain may be as small as a grain of salt. It is very important to use the strainer each and every time you pass your urine. The collection of your stone will allow your health care provider to analyze it and verify that a stone has actually passed. The stone analysis will often identify what you can do to reduce the incidence of recurrences. °· Only take over-the-counter or prescription medicines for pain, discomfort, or fever as directed by your health care provider. °· Make a follow-up appointment with your health care provider as directed. °· Get follow-up X-rays if required. The absence of pain does not always mean that the stone has passed. It may have only stopped moving. If the urine remains completely obstructed, it can cause loss of kidney function or even complete destruction of the kidney. It is your responsibility to make sure X-rays and follow-ups are completed. Ultrasounds of the kidney can show blockages and the status of the kidney. Ultrasounds are not associated with any radiation and can be performed easily in a matter of minutes. °SEEK MEDICAL CARE IF: °· You experience pain that is progressive and unresponsive to any pain medicine you have been prescribed. °SEEK IMMEDIATE MEDICAL CARE IF:  °· Pain cannot be controlled with the prescribed medicine. °· You have a fever or shaking chills. °· The severity or intensity of pain increases over 18 hours and is not relieved by pain medicine. °· You develop a new onset of abdominal pain. °· You feel faint or pass out. °·   You are unable to urinate. °MAKE SURE YOU:  °· Understand these instructions. °· Will watch your condition. °· Will get help right away if you are not doing well or get worse. °Document Released: 09/27/2005 Document Revised: 05/30/2013 Document Reviewed: 02/28/2013 °[employer]® Patient Information ©2015  [employer]. This information is not intended to replace advice given to you by your health care provider. Make sure you discuss any questions you have with your health care provider. ° °Acetaminophen; Oxycodone tablets °What is this medicine? °ACETAMINOPHEN; OXYCODONE (a set a MEE noe fen; ox i KOE done) is a pain reliever. It is used to treat mild to moderate pain. °This medicine may be used for other purposes; ask your health care provider or pharmacist if you have questions. °COMMON BRAND NAME(S): Endocet, Magnacet, Narvox, Percocet, Perloxx, Primalev, Primlev, Roxicet, Xolox °What should I tell my health care provider before I take this medicine? °They need to know if you have any of these conditions: °-brain tumor °-Crohn's disease, inflammatory bowel disease, or ulcerative colitis °-drug abuse or addiction °-head injury °-heart or circulation problems °-if you often drink alcohol °-kidney disease or problems going to the bathroom °-liver disease °-lung disease, asthma, or breathing problems °-an unusual or allergic reaction to acetaminophen, oxycodone, other opioid analgesics, other medicines, foods, dyes, or preservatives °-pregnant or trying to get pregnant °-breast-feeding °How should I use this medicine? °Take this medicine by mouth with a full glass of water. Follow the directions on the prescription label. Take your medicine at regular intervals. Do not take your medicine more often than directed. °Talk to your pediatrician regarding the use of this medicine in children. Special care may be needed. °Patients over 65 years old may have a stronger reaction and need a smaller dose. °Overdosage: If you think you have taken too much of this medicine contact a poison control center or emergency room at once. °NOTE: This medicine is only for you. Do not share this medicine with others. °What if I miss a dose? °If you miss a dose, take it as soon as you can. If it is almost time for your next dose, take only  that dose. Do not take double or extra doses. °What may interact with this medicine? °-alcohol °-antihistamines °-barbiturates like amobarbital, butalbital, butabarbital, methohexital, pentobarbital, phenobarbital, thiopental, and secobarbital °-benztropine °-drugs for bladder problems like solifenacin, trospium, oxybutynin, tolterodine, hyoscyamine, and methscopolamine °-drugs for breathing problems like ipratropium and tiotropium °-drugs for certain stomach or intestine problems like propantheline, homatropine methylbromide, glycopyrrolate, atropine, belladonna, and dicyclomine °-general anesthetics like etomidate, ketamine, nitrous oxide, propofol, desflurane, enflurane, halothane, isoflurane, and sevoflurane °-medicines for depression, anxiety, or psychotic disturbances °-medicines for sleep °-muscle relaxants °-naltrexone °-narcotic medicines (opiates) for pain °-phenothiazines like perphenazine, thioridazine, chlorpromazine, mesoridazine, fluphenazine, prochlorperazine, promazine, and trifluoperazine °-scopolamine °-tramadol °-trihexyphenidyl °This list may not describe all possible interactions. Give your health care provider a list of all the medicines, herbs, non-prescription drugs, or dietary supplements you use. Also tell them if you smoke, drink alcohol, or use illegal drugs. Some items may interact with your medicine. °What should I watch for while using this medicine? °Tell your doctor or health care professional if your pain does not go away, if it gets worse, or if you have new or a different type of pain. You may develop tolerance to the medicine. Tolerance means that you will need a higher dose of the medication for pain relief. Tolerance is normal and is expected if you take this medicine for a long time. °  Do not suddenly stop taking your medicine because you may develop a severe reaction. Your body becomes used to the medicine. This does NOT mean you are addicted. Addiction is a behavior related  to getting and using a drug for a non-medical reason. If you have pain, you have a medical reason to take pain medicine. Your doctor will tell you how much medicine to take. If your doctor wants you to stop the medicine, the dose will be slowly lowered over time to avoid any side effects. °You may get drowsy or dizzy. Do not drive, use machinery, or do anything that needs mental alertness until you know how this medicine affects you. Do not stand or sit up quickly, especially if you are an older patient. This reduces the risk of dizzy or fainting spells. Alcohol may interfere with the effect of this medicine. Avoid alcoholic drinks. °There are different types of narcotic medicines (opiates) for pain. If you take more than one type at the same time, you may have more side effects. Give your health care provider a list of all medicines you use. Your doctor will tell you how much medicine to take. Do not take more medicine than directed. Call emergency for help if you have problems breathing. °The medicine will cause constipation. Try to have a bowel movement at least every 2 to 3 days. If you do not have a bowel movement for 3 days, call your doctor or health care professional. °Do not take Tylenol (acetaminophen) or medicines that have acetaminophen with this medicine. Too much acetaminophen can be very dangerous. Many nonprescription medicines contain acetaminophen. Always read the labels carefully to avoid taking more acetaminophen. °What side effects may I notice from receiving this medicine? °Side effects that you should report to your doctor or health care professional as soon as possible: °-allergic reactions like skin rash, itching or hives, swelling of the face, lips, or tongue °-breathing difficulties, wheezing °-confusion °-light headedness or fainting spells °-severe stomach pain °-unusually weak or tired °-yellowing of the skin or the whites of the eyes °Side effects that usually do not require medical  attention (report to your doctor or health care professional if they continue or are bothersome): °-dizziness °-drowsiness °-nausea °-vomiting °This list may not describe all possible side effects. Call your doctor for medical advice about side effects. You may report side effects to FDA at 1-800-FDA-1088. °Where should I keep my medicine? °Keep out of the reach of children. This medicine can be abused. Keep your medicine in a safe place to protect it from theft. Do not share this medicine with anyone. Selling or giving away this medicine is dangerous and against the law. °Store at room temperature between 20 and 25 degrees C (68 and 77 degrees F). Keep container tightly closed. Protect from light. °This medicine may cause accidental overdose and death if it is taken by other adults, children, or pets. Flush any unused medicine down the toilet to reduce the chance of harm. Do not use the medicine after the expiration date. °NOTE: This sheet is a summary. It may not cover all possible information. If you have questions about this medicine, talk to your doctor, pharmacist, or health care provider. °© 2015, [employer]. (2013-05-21 13:17:35) ° °

## 2015-01-09 ENCOUNTER — Other Ambulatory Visit: Payer: Self-pay | Admitting: Urology

## 2015-01-13 ENCOUNTER — Other Ambulatory Visit: Payer: Self-pay | Admitting: Urology

## 2015-01-15 ENCOUNTER — Encounter (INDEPENDENT_AMBULATORY_CARE_PROVIDER_SITE_OTHER): Payer: Self-pay | Admitting: *Deleted

## 2015-01-17 ENCOUNTER — Encounter (HOSPITAL_COMMUNITY): Payer: Self-pay | Admitting: *Deleted

## 2015-01-17 NOTE — Progress Notes (Signed)
Patient denies any chest pain since MI in 2002.

## 2015-01-19 MED ORDER — GENTAMICIN SULFATE 40 MG/ML IJ SOLN
440.0000 mg | INTRAVENOUS | Status: DC
  Filled 2015-01-19: qty 11

## 2015-01-20 ENCOUNTER — Ambulatory Visit (HOSPITAL_COMMUNITY): Admission: RE | Admit: 2015-01-20 | Payer: BLUE CROSS/BLUE SHIELD | Source: Ambulatory Visit | Admitting: Urology

## 2015-01-20 HISTORY — DX: Calculus of kidney: N20.0

## 2015-01-20 HISTORY — DX: Unspecified convulsions: R56.9

## 2015-01-20 HISTORY — DX: Chronic kidney disease, unspecified: N18.9

## 2015-01-20 HISTORY — DX: Gastro-esophageal reflux disease without esophagitis: K21.9

## 2015-01-20 HISTORY — DX: Acute myocardial infarction, unspecified: I21.9

## 2015-01-20 SURGERY — LITHOTRIPSY, ESWL
Anesthesia: LOCAL | Laterality: Left

## 2015-05-09 ENCOUNTER — Emergency Department (HOSPITAL_COMMUNITY)
Admission: EM | Admit: 2015-05-09 | Discharge: 2015-05-09 | Disposition: A | Discharge status: Home / Self Care | Payer: BLUE CROSS/BLUE SHIELD | Attending: Emergency Medicine | Admitting: Emergency Medicine

## 2015-05-09 ENCOUNTER — Emergency Department (HOSPITAL_COMMUNITY): Payer: BLUE CROSS/BLUE SHIELD

## 2015-05-09 ENCOUNTER — Encounter (HOSPITAL_COMMUNITY): Payer: Self-pay

## 2015-05-09 DIAGNOSIS — Z8719 Personal history of other diseases of the digestive system: Secondary | ICD-10-CM | POA: Diagnosis not present

## 2015-05-09 DIAGNOSIS — M25521 Pain in right elbow: Secondary | ICD-10-CM | POA: Diagnosis not present

## 2015-05-09 DIAGNOSIS — R0789 Other chest pain: Secondary | ICD-10-CM | POA: Diagnosis not present

## 2015-05-09 DIAGNOSIS — R0602 Shortness of breath: Secondary | ICD-10-CM | POA: Diagnosis not present

## 2015-05-09 DIAGNOSIS — N189 Chronic kidney disease, unspecified: Secondary | ICD-10-CM | POA: Diagnosis not present

## 2015-05-09 DIAGNOSIS — I252 Old myocardial infarction: Secondary | ICD-10-CM | POA: Diagnosis not present

## 2015-05-09 DIAGNOSIS — R079 Chest pain, unspecified: Secondary | ICD-10-CM | POA: Diagnosis present

## 2015-05-09 DIAGNOSIS — M109 Gout, unspecified: Secondary | ICD-10-CM | POA: Diagnosis not present

## 2015-05-09 DIAGNOSIS — Z87442 Personal history of urinary calculi: Secondary | ICD-10-CM | POA: Diagnosis not present

## 2015-05-09 DIAGNOSIS — E669 Obesity, unspecified: Secondary | ICD-10-CM | POA: Insufficient documentation

## 2015-05-09 LAB — COMPREHENSIVE METABOLIC PANEL
ALBUMIN: 3.6 g/dL (ref 3.5–5.0)
ALK PHOS: 73 U/L (ref 38–126)
ALT: 37 U/L (ref 17–63)
AST: 28 U/L (ref 15–41)
Anion gap: 10 (ref 5–15)
BUN: 17 mg/dL (ref 6–20)
CALCIUM: 8.7 mg/dL — AB (ref 8.9–10.3)
CO2: 27 mmol/L (ref 22–32)
Chloride: 102 mmol/L (ref 101–111)
Creatinine, Ser: 0.96 mg/dL (ref 0.61–1.24)
GFR calc Af Amer: >60 mL/min (ref >60)
GFR calc non Af Amer: >60 mL/min (ref >60)
Glucose, Bld: 225 mg/dL — ABNORMAL HIGH (ref 65–99)
Potassium: 3.6 mmol/L (ref 3.5–5.1)
Sodium: 139 mmol/L (ref 135–145)
Total Bilirubin: 0.5 mg/dL (ref 0.3–1.2)
Total Protein: 7.3 g/dL (ref 6.5–8.1)

## 2015-05-09 LAB — CBC WITH DIFFERENTIAL/PLATELET
BASOS ABS: 0 10*3/uL (ref 0.0–0.1)
BASOS PCT: 0 % (ref 0–1)
EOS PCT: 1 % (ref 0–5)
Eosinophils Absolute: 0.1 10*3/uL (ref 0.0–0.7)
HCT: 41.8 % (ref 39.0–52.0)
Hemoglobin: 14.1 g/dL (ref 13.0–17.0)
Lymphocytes Relative: 10 % — ABNORMAL LOW (ref 12–46)
Lymphs Abs: 1.3 10*3/uL (ref 0.7–4.0)
MCH: 29.5 pg (ref 26.0–34.0)
MCHC: 33.7 g/dL (ref 30.0–36.0)
MCV: 87.4 fL (ref 78.0–100.0)
Monocytes Absolute: 1.2 10*3/uL — ABNORMAL HIGH (ref 0.1–1.0)
Monocytes Relative: 9 % (ref 3–12)
NEUTROS ABS: 10.2 10*3/uL — AB (ref 1.7–7.7)
NEUTROS PCT: 80 % — AB (ref 43–77)
Platelets: 219 10*3/uL (ref 150–400)
RBC: 4.78 MIL/uL (ref 4.22–5.81)
RDW: 13.3 % (ref 11.5–15.5)
WBC: 12.9 10*3/uL — AB (ref 4.0–10.5)

## 2015-05-09 LAB — TROPONIN I: Troponin I: <0.03 ng/mL (ref <0.031)

## 2015-05-09 MED ORDER — KETOROLAC TROMETHAMINE 30 MG/ML IJ SOLN
30.0000 mg | Freq: Once | INTRAMUSCULAR | Status: AC
  Administered 2015-05-09: 30 mg via INTRAVENOUS
  Filled 2015-05-09: qty 1

## 2015-05-09 NOTE — Discharge Instructions (Signed)
Be sure to take your indomethacin with food.  Return to the emergency department if symptoms significantly worsen or change.   Chest Pain (Nonspecific) It is often hard to give a diagnosis for the cause of chest pain. There is always a chance that your pain could be related to something serious, such as a heart attack or a blood clot in the lungs. You need to follow up with your doctor. HOME CARE  If antibiotic medicine was given, take it as directed by your doctor. Finish the medicine even if you start to feel better.  For the next few days, avoid activities that bring on chest pain. Continue physical activities as told by your doctor.  Do not use any tobacco products. This includes cigarettes, chewing tobacco, and e-cigarettes.  Avoid drinking alcohol.  Only take medicine as told by your doctor.  Follow your doctor's suggestions for more testing if your chest pain does not go away.  Keep all doctor visits you made. GET HELP IF:  Your chest pain does not go away, even after treatment.  You have a rash with blisters on your chest.  You have a fever. GET HELP RIGHT AWAY IF:   You have more pain or pain that spreads to your arm, neck, jaw, back, or belly (abdomen).  You have shortness of breath.  You cough more than usual or cough up blood.  You have very bad back or belly pain.  You feel sick to your stomach (nauseous) or throw up (vomit).  You have very bad weakness.  You pass out (faint).  You have chills. This is an emergency. Do not wait to see if the problems will go away. Call your local emergency services (911 in [place] Do not drive yourself to the hospital. MAKE SURE YOU:   Understand these instructions.  Will watch your condition.  Will get help right away if you are not doing well or get worse. Document Released: 03/15/2008 Document Revised: 10/02/2013 Document Reviewed: 03/15/2008 [facility] Patient Information 2015 [place], [place]. This information is  not intended to replace advice given to you by your health care provider. Make sure you discuss any questions you have with your health care provider.

## 2015-05-09 NOTE — ED Notes (Signed)
Pt states he feels sick to his stomach, is concerned that it is due to the fact he took and extra indomethacin 50 mg tab and he was only supposed to take one.  Pt states his stomach feels "full"

## 2015-05-09 NOTE — ED Provider Notes (Signed)
CSN: [id number]     Arrival date & time 05/09/15  0138 History   First MD Initiated Contact with Patient 05/09/15 0246     Chief Complaint  Patient presents with  . Shortness of Breath     (Consider location/radiation/quality/duration/timing/severity/associated sxs/prior Treatment) HPI Comments: Patient is a 54 year old male with history of gout, chronic renal insufficiency, and acid reflux. He presents for evaluation of chest discomfort that started approximately 3 hours prior to presentation. This started after he took 2-50 mg indomethacin tablets for what he believes is gout starting in his right elbow. He took this medication at approximately 10:00 prior to going to bed and woke up with tightness in his chest and feeling short of breath. He denies any fevers or chills. He denies any productive cough.  Patient is a 54 y.o. male presenting with shortness of breath. The history is provided by the patient.  Shortness of Breath Severity:  Moderate Onset quality:  Sudden Duration:  3 hours Timing:  Constant Progression:  Unchanged Chronicity:  New Relieved by:  Nothing Worsened by:  Nothing tried Ineffective treatments:  None tried Associated symptoms: chest pain   Associated symptoms: no cough and no fever     Past Medical History  Diagnosis Date  . Gout   . Obesity   . Diverticulosis   . Myocardial infarction     mild Heart attack 2002  . Chronic kidney disease   . Renal stone     passed   . Seizures     as a child  . GERD (gastroesophageal reflux disease)     no medical treatment   Past Surgical History  Procedure Laterality Date  . Partial colectomy      Dr Malvin Johns  . Cardiac catheterization    . Throat biop    . Appendectomy    . Back surgery      x5 surgeries   No family history on file. History  Substance Use Topics  . Smoking status: Never Smoker   . Smokeless tobacco: Former Neurosurgeon    Types: Chew  . Alcohol Use: No     Comment: none x 1 year     Review of Systems  Constitutional: Negative for fever.  Respiratory: Positive for shortness of breath. Negative for cough.   Cardiovascular: Positive for chest pain.  All other systems reviewed and are negative.     Allergies  Review of patient's allergies indicates no known allergies.  Home Medications   Prior to Admission medications   Medication Sig Start Date End Date Taking? Authorizing Provider  indomethacin (INDOCIN) 50 MG capsule Take 50 mg by mouth 2 (two) times daily as needed (Gout).    Yes Historical Provider, MD  ketorolac (TORADOL) 10 MG tablet Take 1 tablet (10 mg total) by mouth every 6 (six) hours as needed. 01/01/15   Donnetta Hutching, MD  oxyCODONE-acetaminophen (PERCOCET) 7.5-325 MG per tablet Take 1 tablet by mouth every 4 (four) hours as needed for pain. 01/05/15   Dione Booze, MD  predniSONE (DELTASONE) 50 MG tablet Take 1 tablet (50 mg total) by mouth daily. Patient not taking: Reported on 01/01/2015 09/23/14   Dione Booze, MD  tamsulosin (FLOMAX) 0.4 MG CAPS capsule Take 1 capsule (0.4 mg total) by mouth daily. Patient not taking: Reported on 01/17/2015 01/01/15   Donnetta Hutching, MD   BP 147/93 mmHg  Pulse 72  Temp(Src) 98.4 F (36.9 C) (Oral)  Resp 17  Ht 5\' 7"  (1.702 m)  Wt 240  lb (108.863 kg)  BMI 37.58 kg/m2  SpO2 96% Physical Exam  Constitutional: He is oriented to person, place, and time. He appears well-developed and well-nourished. No distress.  HENT:  Head: Normocephalic and atraumatic.  Mouth/Throat: Oropharynx is clear and moist.  Neck: Normal range of motion. Neck supple.  Cardiovascular: Normal rate, regular rhythm and normal heart sounds.   No murmur heard. Pulmonary/Chest: Effort normal and breath sounds normal. No respiratory distress. He has no wheezes. He has no rales.  Abdominal: Soft. Bowel sounds are normal. He exhibits no distension and no mass. There is no tenderness. There is no rebound and no guarding.  Abdomen is obese.   Musculoskeletal: Normal range of motion. He exhibits no edema.  There is pain with range of motion of the right elbow. There is no palpable effusion, redness, or warmth.  Lymphadenopathy:    He has no cervical adenopathy.  Neurological: He is alert and oriented to person, place, and time.  Skin: Skin is warm and dry. He is not diaphoretic.  Nursing note and vitals reviewed.   ED Course  Procedures (including critical care time) Labs Review Labs Reviewed  COMPREHENSIVE METABOLIC PANEL  CBC WITH DIFFERENTIAL/PLATELET  TROPONIN I    Imaging Review No results found.   EKG Interpretation   Date/Time:  Friday May 09 2015 01:58:20 EDT Ventricular Rate:  74 PR Interval:  149 QRS Duration: 85 QT Interval:  370 QTC Calculation: 410 R Axis:   31 Text Interpretation:  Sinus rhythm Confirmed by Prabhnoor Ellenberger  MD, Lindsey Hommel ([id number])  on 05/09/2015 2:47:14 AM      MDM   Final diagnoses:  None    Patient is a 54 year old male who presents here with complaints of discomfort in his chest and nausea that started after taking 100 mg of indomethacin for what he believes to be gout in his right elbow. Shortly after taking this he laid down and went to sleep. He was woken several hours later with pain in his chest that I believe is most likely related to this Indocin. His workup reveals no evidence for a cardiac etiology and he is now resting comfortably. I believe he is appropriate for discharge, to return as needed for any problems. He was advised to take his Indocin with food.    Geoffery Lyons, MD 05/09/15 [phone]

## 2015-05-28 ENCOUNTER — Encounter (HOSPITAL_COMMUNITY): Payer: Self-pay | Admitting: Emergency Medicine

## 2015-05-28 ENCOUNTER — Emergency Department (HOSPITAL_COMMUNITY): Payer: BLUE CROSS/BLUE SHIELD

## 2015-05-28 ENCOUNTER — Emergency Department (HOSPITAL_COMMUNITY)
Admission: EM | Admit: 2015-05-28 | Discharge: 2015-05-28 | Disposition: A | Discharge status: Home / Self Care | Payer: BLUE CROSS/BLUE SHIELD | Attending: Emergency Medicine | Admitting: Emergency Medicine

## 2015-05-28 DIAGNOSIS — Z87442 Personal history of urinary calculi: Secondary | ICD-10-CM | POA: Diagnosis not present

## 2015-05-28 DIAGNOSIS — M109 Gout, unspecified: Secondary | ICD-10-CM | POA: Diagnosis not present

## 2015-05-28 DIAGNOSIS — I252 Old myocardial infarction: Secondary | ICD-10-CM | POA: Diagnosis not present

## 2015-05-28 DIAGNOSIS — E669 Obesity, unspecified: Secondary | ICD-10-CM | POA: Diagnosis not present

## 2015-05-28 DIAGNOSIS — Z9889 Other specified postprocedural states: Secondary | ICD-10-CM | POA: Insufficient documentation

## 2015-05-28 DIAGNOSIS — N189 Chronic kidney disease, unspecified: Secondary | ICD-10-CM | POA: Diagnosis not present

## 2015-05-28 DIAGNOSIS — R0789 Other chest pain: Secondary | ICD-10-CM | POA: Diagnosis not present

## 2015-05-28 DIAGNOSIS — R0602 Shortness of breath: Secondary | ICD-10-CM | POA: Insufficient documentation

## 2015-05-28 DIAGNOSIS — R079 Chest pain, unspecified: Secondary | ICD-10-CM | POA: Diagnosis present

## 2015-05-28 DIAGNOSIS — Z8719 Personal history of other diseases of the digestive system: Secondary | ICD-10-CM | POA: Diagnosis not present

## 2015-05-28 LAB — BASIC METABOLIC PANEL
ANION GAP: 9 (ref 5–15)
BUN: 16 mg/dL (ref 6–20)
CO2: 26 mmol/L (ref 22–32)
Calcium: 8.8 mg/dL — ABNORMAL LOW (ref 8.9–10.3)
Chloride: 103 mmol/L (ref 101–111)
Creatinine, Ser: 0.97 mg/dL (ref 0.61–1.24)
GFR calc non Af Amer: >60 mL/min (ref >60)
GLUCOSE: 223 mg/dL — AB (ref 65–99)
POTASSIUM: 3.3 mmol/L — AB (ref 3.5–5.1)
Sodium: 138 mmol/L (ref 135–145)

## 2015-05-28 LAB — CBC
HEMATOCRIT: 45.2 % (ref 39.0–52.0)
HEMOGLOBIN: 15 g/dL (ref 13.0–17.0)
MCH: 29.5 pg (ref 26.0–34.0)
MCHC: 33.2 g/dL (ref 30.0–36.0)
MCV: 89 fL (ref 78.0–100.0)
Platelets: 254 10*3/uL (ref 150–400)
RBC: 5.08 MIL/uL (ref 4.22–5.81)
RDW: 13.5 % (ref 11.5–15.5)
WBC: 9.4 10*3/uL (ref 4.0–10.5)

## 2015-05-28 LAB — TROPONIN I: Troponin I: <0.03 ng/mL (ref <0.031)

## 2015-05-28 LAB — D-DIMER, QUANTITATIVE (NOT AT ARMC)

## 2015-05-28 MED ORDER — NITROGLYCERIN 2 % TD OINT
1.0000 [in_us] | TOPICAL_OINTMENT | Freq: Once | TRANSDERMAL | Status: AC
  Administered 2015-05-28: 1 [in_us] via TOPICAL

## 2015-05-28 MED ORDER — ASPIRIN 81 MG PO CHEW
324.0000 mg | CHEWABLE_TABLET | Freq: Once | ORAL | Status: AC
  Administered 2015-05-28: 324 mg via ORAL

## 2015-05-28 MED ORDER — POTASSIUM CHLORIDE CRYS ER 20 MEQ PO TBCR
40.0000 meq | EXTENDED_RELEASE_TABLET | Freq: Once | ORAL | Status: AC
  Administered 2015-05-28: 40 meq via ORAL
  Filled 2015-05-28: qty 2

## 2015-05-28 MED ORDER — ASPIRIN 81 MG PO CHEW
CHEWABLE_TABLET | ORAL | Status: AC
  Filled 2015-05-28: qty 4

## 2015-05-28 MED ORDER — IOHEXOL 350 MG/ML SOLN
100.0000 mL | Freq: Once | INTRAVENOUS | Status: AC | PRN
  Administered 2015-05-28: 100 mL via INTRAVENOUS

## 2015-05-28 MED ORDER — NITROGLYCERIN 2 % TD OINT
TOPICAL_OINTMENT | TRANSDERMAL | Status: AC
  Filled 2015-05-28: qty 1

## 2015-05-28 NOTE — Discharge Instructions (Signed)

## 2015-05-28 NOTE — ED Notes (Signed)
Patient is resting comfortably. 

## 2015-05-28 NOTE — ED Notes (Signed)
Patient ambulated with assist tolerated well 02 99% HR 96

## 2015-05-28 NOTE — ED Notes (Signed)
Pt c/o chest pressure, sob and bilateral arm heaviness.

## 2015-05-28 NOTE — ED Provider Notes (Signed)
CSN: [id number]     Arrival date & time 05/28/15  4098 History   First MD Initiated Contact with Patient 05/28/15 0350    Chief Complaint  Patient presents with  . Chest Pain     (Consider location/radiation/quality/duration/timing/severity/associated sxs/prior Treatment) HPI  Patient reports he was awakened at 3 AM with feeling short of breath. He states he feels like he can't breathe deeply. He states he has a weight on his chest like someone sitting on his chest, however he denies having chest pain. He denies coughing or wheezing. He states he feels dizzy but denies it a spinning sensation and states it feels more like he is off balance. He denies any swelling or pain in his legs. He states he had something similar last month and was felt to be due to a reaction from him taking 2 of his gout pills at one time instead of one pill. He has not had his gout medicine now for 4 days. He does report 10 days ago they flew to [place] which was a 4 hour flight and 3 days ago they flew back. He is unsure of any family history of heart disease.  PCP Dr Phillips Odor  Past Medical History  Diagnosis Date  . Gout   . Obesity   . Diverticulosis   . Myocardial infarction     mild Heart attack 2002  . Chronic kidney disease   . Renal stone     passed   . Seizures     as a child  . GERD (gastroesophageal reflux disease)     no medical treatment   Past Surgical History  Procedure Laterality Date  . Partial colectomy      Dr Malvin Johns  . Cardiac catheterization    . Throat biop    . Appendectomy    . Back surgery      x5 surgeries   History reviewed. No pertinent family history. Social History  Substance Use Topics  . Smoking status: Never Smoker   . Smokeless tobacco: Former Neurosurgeon    Types: Chew  . 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     Allergies  Review of patient's  allergies indicates no known allergies.  Home Medications   Prior to Admission medications   Medication Sig Start Date End Date Taking? Authorizing Provider  indomethacin (INDOCIN) 50 MG capsule Take 50 mg by mouth 2 (two) times daily as needed (Gout).     Historical Provider, MD  ketorolac (TORADOL) 10 MG tablet Take 1 tablet (10 mg total) by mouth every 6 (six) hours as needed. 01/01/15   Donnetta Hutching, MD  oxyCODONE-acetaminophen (PERCOCET) 7.5-325 MG per tablet Take 1 tablet by mouth every 4 (four) hours as needed for pain. 01/05/15   Dione Booze, MD  predniSONE (DELTASONE) 50 MG tablet Take 1 tablet (50 mg total) by mouth daily. Patient not taking: Reported on 01/01/2015 09/23/14   Dione Booze, MD  tamsulosin (FLOMAX) 0.4 MG CAPS capsule Take 1 capsule (0.4 mg total) by mouth daily. Patient not taking: Reported on 01/17/2015 01/01/15   Donnetta Hutching, MD   BP 157/94 mmHg  Pulse 78  Temp(Src) 97.7 F (36.5 C)  Resp 20  Ht  (1.702 m)  Wt 240 lb (108.863 kg)  BMI 37.58 kg/m2  SpO2 98%  Vital signs normal except for hypertension  Physical  Exam  Constitutional: He is oriented to person, place, and time. He appears well-developed and well-nourished.  Non-toxic appearance. He does not appear ill. No distress.  HENT:  Head: Normocephalic and atraumatic.  Right Ear: External ear normal.  Left Ear: External ear normal.  Nose: Nose normal. No mucosal edema or rhinorrhea.  Mouth/Throat: Oropharynx is clear and moist and mucous membranes are normal. No dental abscesses or uvula swelling.  Eyes: Conjunctivae and EOM are normal. Pupils are equal, round, and reactive to light.  Neck: Normal range of motion and full passive range of motion without pain. Neck supple.  Cardiovascular: Normal rate, regular rhythm and normal heart sounds.  Exam reveals no gallop and no friction rub.   No murmur heard. Pulmonary/Chest: Effort normal and breath sounds normal. No respiratory distress. He has no wheezes.  He has no rhonchi. He has no rales. He exhibits no tenderness and no crepitus.  Abdominal: Soft. Normal appearance and bowel sounds are normal. He exhibits no distension. There is no tenderness. There is no rebound and no guarding.  Musculoskeletal: Normal range of motion. He exhibits no edema or tenderness.  Moves all extremities well. Nontender calves  Neurological: He is alert and oriented to person, place, and time. He has normal strength. No cranial nerve deficit.  Skin: Skin is warm, dry and intact. No rash noted. No erythema. No pallor.  Psychiatric: He has a normal mood and affect. His speech is normal and behavior is normal. His mood appears not anxious.  Nursing note and vitals reviewed.   ED Course  Procedures (including critical care time)   Medications  aspirin chewable tablet 324 mg (324 mg Oral Given 05/28/15 0413)  nitroGLYCERIN (NITROGLYN) 2 % ointment 1 inch (1 inch Topical Given 05/28/15 0413)  iohexol (OMNIPAQUE) 350 MG/ML injection 100 mL (100 mLs Intravenous Contrast Given 05/28/15 0447)  potassium chloride SA (K-DUR,KLOR-CON) CR tablet 40 mEq (40 mEq Oral Given 05/28/15 0459)    We discussed due to his recent travel patient was at risk for having a pulmonary embolus. He is agreeable to getting a CT scan done. We discussed doing other blood work to 2 also check for cardiac problems.  4:50 AM patient was given his lab results. His CT is still pending. He was started on potassium for his mildly low potassium level on his blood work. He states he is starting to feel better.  5:30 AM patient was given the results of his CT scan. He states now he feels like he awakened quickly and may be had a panic attack. He states when he had his heart attack he had episode of not feeling well with tingling all over his body. He then was having syncopal episodes. He states he went to [facility] and all these tests were normal except he did have a positive troponin. He states however  when he had a heart catheterization done a couple days later there were no blockages. They told him they felt his heart attack was from stress. He denies being under any extra stress at this time. We discussed staying to 8 AM and having a second troponin which will be 5 hours after onset of symptoms for a delta troponin to evaluate him further for cardiac disease. He is agreeable.  Patient was ambulated by nursing staff and his pulse ox was 99% on room air and he tolerated it well.  07:09 Pt left at change of shift with Dr Marylen Ponto to get second troponin, if negative he  can be discharged home.   Labs Review Results for orders placed or performed during the hospital encounter of 05/28/15  Basic metabolic panel  Result Value Ref Range   Sodium 138 135 - 145 mmol/L   Potassium 3.3 (L) 3.5 - 5.1 mmol/L   Chloride 103 101 - 111 mmol/L   CO2 26 22 - 32 mmol/L   Glucose, Bld 223 (H) 65 - 99 mg/dL   BUN 16 6 - 20 mg/dL   Creatinine, Ser 9.60 0.61 - 1.24 mg/dL   Calcium 8.8 (L) 8.9 - 10.3 mg/dL   GFR calc non Af Amer >60 >60 mL/min   GFR calc Af Amer >60 >60 mL/min   Anion gap 9 5 - 15  CBC  Result Value Ref Range   WBC 9.4 4.0 - 10.5 K/uL   RBC 5.08 4.22 - 5.81 MIL/uL   Hemoglobin 15.0 13.0 - 17.0 g/dL   HCT 45.4 09.8 - 11.9 %   MCV 89.0 78.0 - 100.0 fL   MCH 29.5 26.0 - 34.0 pg   MCHC 33.2 30.0 - 36.0 g/dL   RDW 14.7 82.9 - 56.2 %   Platelets 254 150 - 400 K/uL  Troponin I  Result Value Ref Range   Troponin I <0.03 <0.031 ng/mL  D-dimer, quantitative  Result Value Ref Range   D-Dimer, Quant <0.27 0.00 - 0.48 ug/mL-FEU    Laboratory interpretation all normal except mild hypokalemia.     Imaging Review Ct Angio Chest Pe W/cm &/or Wo Cm  05/28/2015   CLINICAL DATA:  Acute onset shortness of breath.  Recent air travel.  EXAM: CT ANGIOGRAPHY CHEST WITH CONTRAST  TECHNIQUE: Multidetector CT imaging of the chest was performed using the standard protocol during bolus administration of  intravenous contrast. Multiplanar CT image reconstructions and MIPs were obtained to evaluate the vascular anatomy.  CONTRAST:  OMNIPAQUE IOHEXOL 350 MG/ML SOLN  COMPARISON:  None.  FINDINGS: THORACIC INLET/BODY WALL:  No acute abnormality.  MEDIASTINUM:  Normal heart size. No pericardial effusion. No acute vascular abnormality, including evidence of pulmonary embolism. No aortic dissection or aneurysm. No adenopathy.  LUNG WINDOWS:  No consolidation.  No effusion.  No suspicious pulmonary nodule.  UPPER ABDOMEN:  Marked hepatic steatosis.  OSSEOUS:  No acute fracture.  No suspicious lytic or blastic lesions.  Review of the MIP images confirms the above findings.  IMPRESSION: 1. No evidence of pulmonary embolism or other acute disease. 2. Marked hepatic steatosis.   Electronically Signed   By: Marnee Spring M.D.   On: 05/28/2015 05:00   I have personally reviewed and evaluated these images and lab results as part of my medical decision-making.   EKG Interpretation   Date/Time:  Wednesday May 28 2015 03:36:34 EDT Ventricular Rate:  78 PR Interval:  157 QRS Duration: 83 QT Interval:  403 QTC Calculation: 459 R Axis:   19 Text Interpretation:  Sinus rhythm Borderline low voltage, extremity leads  No significant change since last tracing 09 May 2015 Confirmed by Jakiah Goree   MD-I, Josceline Chenard ([id number]) on 05/28/2015 3:58:06 AM      MDM   Final diagnoses:  SOB (shortness of breath)  Chest pressure    Disposition pending  Devoria Albe, MD, Concha Pyo, MD 05/28/15 [phone]

## 2015-06-02 ENCOUNTER — Encounter (HOSPITAL_COMMUNITY): Payer: Self-pay | Admitting: *Deleted

## 2015-06-02 ENCOUNTER — Emergency Department (HOSPITAL_COMMUNITY)
Admission: EM | Admit: 2015-06-02 | Discharge: 2015-06-02 | Disposition: A | Discharge status: Home / Self Care | Payer: BLUE CROSS/BLUE SHIELD | Attending: Emergency Medicine | Admitting: Emergency Medicine

## 2015-06-02 DIAGNOSIS — M109 Gout, unspecified: Secondary | ICD-10-CM

## 2015-06-02 DIAGNOSIS — I252 Old myocardial infarction: Secondary | ICD-10-CM | POA: Diagnosis not present

## 2015-06-02 DIAGNOSIS — Z8719 Personal history of other diseases of the digestive system: Secondary | ICD-10-CM | POA: Diagnosis not present

## 2015-06-02 DIAGNOSIS — Z9889 Other specified postprocedural states: Secondary | ICD-10-CM | POA: Diagnosis not present

## 2015-06-02 DIAGNOSIS — Z87442 Personal history of urinary calculi: Secondary | ICD-10-CM | POA: Diagnosis not present

## 2015-06-02 DIAGNOSIS — M10072 Idiopathic gout, left ankle and foot: Secondary | ICD-10-CM | POA: Insufficient documentation

## 2015-06-02 DIAGNOSIS — M25572 Pain in left ankle and joints of left foot: Secondary | ICD-10-CM | POA: Diagnosis present

## 2015-06-02 DIAGNOSIS — E669 Obesity, unspecified: Secondary | ICD-10-CM | POA: Diagnosis not present

## 2015-06-02 DIAGNOSIS — N189 Chronic kidney disease, unspecified: Secondary | ICD-10-CM | POA: Diagnosis not present

## 2015-06-02 MED ORDER — COLCHICINE 0.6 MG PO TABS
1.2000 mg | ORAL_TABLET | Freq: Once | ORAL | Status: AC
  Administered 2015-06-02: 1.2 mg via ORAL
  Filled 2015-06-02: qty 2

## 2015-06-02 MED ORDER — COLCHICINE 0.6 MG PO TABS: 0.6000 mg | ORAL_TABLET | Freq: Two times a day (BID) | ORAL | Status: DC | PRN

## 2015-06-02 MED ORDER — DEXAMETHASONE 4 MG PO TABS
12.0000 mg | ORAL_TABLET | Freq: Once | ORAL | Status: AC
  Administered 2015-06-02: 12 mg via ORAL
  Filled 2015-06-02: qty 3

## 2015-06-02 MED ORDER — OXYCODONE-ACETAMINOPHEN 5-325 MG PO TABS: 1.0000 | ORAL_TABLET | ORAL | Status: DC | PRN

## 2015-06-02 MED ORDER — HYDROMORPHONE HCL 1 MG/ML IJ SOLN
1.0000 mg | Freq: Once | INTRAMUSCULAR | Status: AC
  Administered 2015-06-02: 1 mg via INTRAMUSCULAR
  Filled 2015-06-02: qty 1

## 2015-06-02 NOTE — ED Notes (Signed)
Pt alert & oriented x4, stable gait. Patient  given discharge instructions, paperwork & prescription(s).Patient informed not to drive, operate any equipment & handel any important documents 4 hours after taking pain medication. Patient  verbalized understanding. Pt left department w/ no further questions. 

## 2015-06-02 NOTE — ED Notes (Signed)
Pt c/o gout to left ankle that started today

## 2015-06-02 NOTE — Discharge Instructions (Signed)

## 2015-06-12 NOTE — ED Provider Notes (Signed)
CSN: [id number]     Arrival date & time 06/02/15  1905 History   First MD Initiated Contact with Patient 06/02/15 1919     Chief Complaint  Patient presents with  . Gout     (Consider location/radiation/quality/duration/timing/severity/associated sxs/prior Treatment) HPI   54 year old male with left ankle pain. Atraumatic. Gradual onset of progressively worsening. Patient has a past history of gout and feels that his current symptoms are result of this. No fevers or chills. No acute numbness or tingling. Denies any significant acute pain elsewhere. Patient was previously taking indomethacin but advised to stop recently by another provider seemingly to help prevent GI complications.  Past Medical History  Diagnosis Date  . Gout   . Obesity   . Diverticulosis   . Myocardial infarction     mild Heart attack 2002  . Chronic kidney disease   . Renal stone     passed   . Seizures     as a child  . GERD (gastroesophageal reflux disease)     no medical treatment   Past Surgical History  Procedure Laterality Date  . Partial colectomy      Dr Malvin Johns  . Cardiac catheterization    . Throat biop    . Appendectomy    . Back surgery      x5 surgeries   History reviewed. No pertinent family history. Social History  Substance Use Topics  . Smoking status: Never Smoker   . Smokeless tobacco: Former Neurosurgeon    Types: Chew  . Alcohol Use: No     Comment: none x 1 year    Review of Systems  All systems reviewed and negative, other than as noted in HPI.   Allergies  Review of patient's allergies indicates no known allergies.  Home Medications   Prior to Admission medications   Medication Sig Start Date End Date Taking? Authorizing Provider  Aspirin-Salicylamide-Caffeine (BC HEADACHE POWDER PO) Take 1 Package by mouth daily as needed (headaches).    Historical Provider, MD  colchicine 0.6 MG tablet Take 1 tablet (0.6 mg total) by mouth 2 (two) times daily as needed (gout).  06/02/15   Raeford Razor, MD  indomethacin (INDOCIN) 50 MG capsule Take 50 mg by mouth 2 (two) times daily as needed (Gout).     Historical Provider, MD  oxyCODONE-acetaminophen (PERCOCET/ROXICET) 5-325 MG per tablet Take 1-2 tablets by mouth every 4 (four) hours as needed. 06/02/15   Raeford Razor, MD   BP 142/80 mmHg  Pulse 77  Temp(Src) 98.4 F (36.9 C) (Oral)  Resp 20  Ht  (1.702 m)  Wt 250 lb (113.399 kg)  BMI 39.15 kg/m2  SpO2 99% Physical Exam  Constitutional: He appears well-developed and well-nourished. No distress.  HENT:  Head: Normocephalic and atraumatic.  Eyes: Conjunctivae are normal. Right eye exhibits no discharge. Left eye exhibits no discharge.  Neck: Neck supple.  Cardiovascular: Normal rate, regular rhythm and normal heart sounds.  Exam reveals no gallop and no friction rub.   No murmur heard. Pulmonary/Chest: Effort normal and breath sounds normal. No respiratory distress.  Abdominal: Soft. He exhibits no distension. There is no tenderness.  Musculoskeletal: He exhibits no edema or tenderness.  Mild erythema of the left ankle for foot. Tender to palpation. Significant increase in pain with range of motion of the ankle. Palpable DP pulse. Sensation is intact to light touch. Patient can actively range his ankle although he has increased pain. Left calf is symmetric as compared to the  right. He has no calf tenderness.  Neurological: He is alert.  Skin: Skin is warm and dry.  Psychiatric: He has a normal mood and affect. His behavior is normal. Thought content normal.  Nursing note and vitals reviewed.   ED Course  Procedures (including critical care time) Labs Review Labs Reviewed - No data to display  Imaging Review No results found. I have personally reviewed and evaluated these images and lab results as part of my medical decision-making.   EKG Interpretation None      MDM   Final diagnoses:  Acute gout of left ankle, unspecified cause     54 year old male with exam and symptoms consistent with gout. Consider infectious, DVT or other vascular issue but doubt. Plan symptomatic treatment. Return precautions were discussed.    Raeford Razor, MD 06/12/15 [phone]

## 2015-06-19 ENCOUNTER — Emergency Department (HOSPITAL_COMMUNITY)
Admission: EM | Admit: 2015-06-19 | Discharge: 2015-06-19 | Disposition: A | Discharge status: Home / Self Care | Payer: BLUE CROSS/BLUE SHIELD | Attending: Emergency Medicine | Admitting: Emergency Medicine

## 2015-06-19 ENCOUNTER — Emergency Department (HOSPITAL_COMMUNITY): Payer: BLUE CROSS/BLUE SHIELD

## 2015-06-19 ENCOUNTER — Encounter (HOSPITAL_COMMUNITY): Payer: Self-pay | Admitting: Emergency Medicine

## 2015-06-19 DIAGNOSIS — I252 Old myocardial infarction: Secondary | ICD-10-CM | POA: Diagnosis not present

## 2015-06-19 DIAGNOSIS — M545 Low back pain: Secondary | ICD-10-CM | POA: Diagnosis not present

## 2015-06-19 DIAGNOSIS — R1013 Epigastric pain: Secondary | ICD-10-CM | POA: Diagnosis not present

## 2015-06-19 DIAGNOSIS — Z87442 Personal history of urinary calculi: Secondary | ICD-10-CM | POA: Diagnosis not present

## 2015-06-19 DIAGNOSIS — Z7982 Long term (current) use of aspirin: Secondary | ICD-10-CM | POA: Diagnosis not present

## 2015-06-19 DIAGNOSIS — R109 Unspecified abdominal pain: Secondary | ICD-10-CM

## 2015-06-19 DIAGNOSIS — R079 Chest pain, unspecified: Secondary | ICD-10-CM | POA: Diagnosis not present

## 2015-06-19 DIAGNOSIS — M109 Gout, unspecified: Secondary | ICD-10-CM | POA: Diagnosis not present

## 2015-06-19 DIAGNOSIS — E669 Obesity, unspecified: Secondary | ICD-10-CM | POA: Diagnosis not present

## 2015-06-19 DIAGNOSIS — Z79899 Other long term (current) drug therapy: Secondary | ICD-10-CM | POA: Insufficient documentation

## 2015-06-19 DIAGNOSIS — I2510 Atherosclerotic heart disease of native coronary artery without angina pectoris: Secondary | ICD-10-CM | POA: Insufficient documentation

## 2015-06-19 DIAGNOSIS — K219 Gastro-esophageal reflux disease without esophagitis: Secondary | ICD-10-CM | POA: Diagnosis not present

## 2015-06-19 DIAGNOSIS — R51 Headache: Secondary | ICD-10-CM | POA: Insufficient documentation

## 2015-06-19 HISTORY — DX: Atherosclerotic heart disease of native coronary artery without angina pectoris: I25.10

## 2015-06-19 LAB — CBC WITH DIFFERENTIAL/PLATELET
BASOS ABS: 0.1 10*3/uL (ref 0.0–0.1)
BASOS PCT: 1 % (ref 0–1)
EOS PCT: 2 % (ref 0–5)
Eosinophils Absolute: 0.1 10*3/uL (ref 0.0–0.7)
HEMATOCRIT: 45.9 % (ref 39.0–52.0)
Hemoglobin: 14.9 g/dL (ref 13.0–17.0)
Lymphocytes Relative: 20 % (ref 12–46)
Lymphs Abs: 1.4 10*3/uL (ref 0.7–4.0)
MCH: 28.7 pg (ref 26.0–34.0)
MCHC: 32.5 g/dL (ref 30.0–36.0)
MCV: 88.3 fL (ref 78.0–100.0)
MONO ABS: 0.6 10*3/uL (ref 0.1–1.0)
MONOS PCT: 8 % (ref 3–12)
NEUTROS ABS: 5.1 10*3/uL (ref 1.7–7.7)
Neutrophils Relative %: 69 % (ref 43–77)
PLATELETS: 235 10*3/uL (ref 150–400)
RBC: 5.2 MIL/uL (ref 4.22–5.81)
RDW: 13.4 % (ref 11.5–15.5)
WBC: 7.4 10*3/uL (ref 4.0–10.5)

## 2015-06-19 LAB — BASIC METABOLIC PANEL
ANION GAP: 8 (ref 5–15)
BUN: 10 mg/dL (ref 6–20)
CALCIUM: 9.1 mg/dL (ref 8.9–10.3)
CO2: 27 mmol/L (ref 22–32)
CREATININE: 0.85 mg/dL (ref 0.61–1.24)
Chloride: 104 mmol/L (ref 101–111)
GLUCOSE: 155 mg/dL — AB (ref 65–99)
Potassium: 3.8 mmol/L (ref 3.5–5.1)
Sodium: 139 mmol/L (ref 135–145)

## 2015-06-19 LAB — URINALYSIS, ROUTINE W REFLEX MICROSCOPIC
BILIRUBIN URINE: NEGATIVE
GLUCOSE, UA: NEGATIVE mg/dL
HGB URINE DIPSTICK: NEGATIVE
KETONES UR: NEGATIVE mg/dL
Leukocytes, UA: NEGATIVE
Nitrite: NEGATIVE
PH: 5 (ref 5.0–8.0)
Protein, ur: NEGATIVE mg/dL
SPECIFIC GRAVITY, URINE: 1.021 (ref 1.005–1.030)
Urobilinogen, UA: 1 mg/dL (ref 0.0–1.0)

## 2015-06-19 LAB — HEPATIC FUNCTION PANEL
ALK PHOS: 71 U/L (ref 38–126)
ALT: 46 U/L (ref 17–63)
AST: 38 U/L (ref 15–41)
Albumin: 3.5 g/dL (ref 3.5–5.0)
BILIRUBIN DIRECT: 0.1 mg/dL (ref 0.1–0.5)
BILIRUBIN INDIRECT: 0.6 mg/dL (ref 0.3–0.9)
Total Bilirubin: 0.7 mg/dL (ref 0.3–1.2)
Total Protein: 7.5 g/dL (ref 6.5–8.1)

## 2015-06-19 LAB — I-STAT TROPONIN, ED: Troponin i, poc: 0 ng/mL (ref 0.00–0.08)

## 2015-06-19 LAB — LIPASE, BLOOD: LIPASE: 24 U/L (ref 22–51)

## 2015-06-19 MED ORDER — HYDROCODONE-ACETAMINOPHEN 5-325 MG PO TABS: 1.0000 | ORAL_TABLET | ORAL | Status: DC | PRN

## 2015-06-19 MED ORDER — IOHEXOL 300 MG/ML  SOLN
100.0000 mL | Freq: Once | INTRAMUSCULAR | Status: AC | PRN
  Administered 2015-06-19: 100 mL via INTRAVENOUS

## 2015-06-19 NOTE — ED Provider Notes (Signed)
CSN: [id number]     Arrival date & time 06/19/15  1324 History   First MD Initiated Contact with Patient 06/19/15 [phone]     Chief Complaint  Patient presents with  . Chest Pain     HPI   John Wallace is a 54 y.o. male with a PMH of CAD s/p MI 2002, GERD, obesity, gout who presents to the ED with chest pain x several weeks. Reports he was seen 05/28/15 in the ED for similar symptoms with negative workup. States his pain is intermittent and radiates from his sternum to his left shoulder and to his back. Reports eating precipitates his pain. States he has walked approximately 22 miles over the past week with his wife and denies exertional chest pain or exertional dyspnea. Has tried pantoprazole and gas-x for symptom relief, which have only been minimally effective. Complains of pain with deep inspiration x 1 week. Denies fever, chills, lightheadedness, dizziness, shortness of breath,  N/V/D/C, dysuria. Reports abdominal pain and bloating, which he attributes to gas, and states he has experienced urinary urgency since the onset of his symptoms. Reports cough, which he states is unchanged from baseline.   Past Medical History  Diagnosis Date  . Gout   . Obesity   . Diverticulosis   . Myocardial infarction     mild Heart attack 2002  . Chronic kidney disease   . Renal stone     passed   . Seizures     as a child  . GERD (gastroesophageal reflux disease)     no medical treatment  . Coronary artery disease    Past Surgical History  Procedure Laterality Date  . Partial colectomy      Dr Malvin Johns  . Cardiac catheterization    . Throat biop    . Appendectomy    . Back surgery      x5 surgeries   History reviewed. No pertinent family history. Social History  Substance Use Topics  . Smoking status: Never Smoker   . Smokeless tobacco: Former Neurosurgeon    Types: Chew  . Alcohol Use: No     Comment: none x 1 year     Review of Systems  Constitutional: Negative for fever, chills,  activity change, appetite change and fatigue.  HENT: Negative for congestion.   Respiratory: Positive for cough and chest tightness. Negative for shortness of breath.        Reports cough, unchanged from baseline.  Cardiovascular: Positive for chest pain. Negative for palpitations and leg swelling.  Gastrointestinal: Positive for abdominal pain and abdominal distention. Negative for nausea, vomiting, diarrhea and constipation.       Reports abdominal bloating.  Genitourinary: Negative for dysuria, urgency and frequency.  Musculoskeletal: Positive for back pain. Negative for myalgias, arthralgias, gait problem, neck pain and neck stiffness.  Skin: Negative for color change, pallor, rash and wound.  Neurological: Positive for headaches. Negative for dizziness, syncope, weakness, light-headedness and numbness.       Reports sinus headaches, unchanged from baseline.  All other systems reviewed and are negative.     Allergies  Review of patient's allergies indicates no known allergies.  Home Medications   Prior to Admission medications   Medication Sig Start Date End Date Taking? Authorizing Provider  indomethacin (INDOCIN) 50 MG capsule Take 50 mg by mouth daily as needed for mild pain (GOUT).    Yes Historical Provider, MD  oxyCODONE-acetaminophen (PERCOCET/ROXICET) 5-325 MG per tablet Take 1-2 tablets by mouth every 4 (  four) hours as needed. 06/02/15  Yes Raeford Razor, MD  pantoprazole (PROTONIX) 40 MG tablet Take 40 mg by mouth daily.   Yes Historical Provider, MD  simethicone (GAS-X) 80 MG chewable tablet Chew 80 mg by mouth every 6 (six) hours as needed for flatulence.   Yes Historical Provider, MD  Aspirin-Salicylamide-Caffeine (BC HEADACHE POWDER PO) Take 1 Package by mouth daily as needed (headaches).    Historical Provider, MD  colchicine 0.6 MG tablet Take 1 tablet (0.6 mg total) by mouth 2 (two) times daily as needed (gout). Patient not taking: Reported on 06/19/2015 06/02/15    Raeford Razor, MD    BP 142/86 mmHg  Pulse 62  Temp(Src) 97.9 F (36.6 C) (Oral)  Resp 17  Ht  (1.727 m)  Wt 250 lb (113.399 kg)  BMI 38.02 kg/m2  SpO2 87% Physical Exam  Constitutional: He is oriented to person, place, and time. He appears well-developed and well-nourished. No distress.  HENT:  Head: Normocephalic and atraumatic.  Right Ear: External ear normal.  Left Ear: External ear normal.  Nose: Nose normal.  Mouth/Throat: Uvula is midline, oropharynx is clear and moist and mucous membranes are normal.  Eyes: Conjunctivae, EOM and lids are normal. Pupils are equal, round, and reactive to light. Right eye exhibits no discharge. Left eye exhibits no discharge. No scleral icterus.  Neck: Normal range of motion. Neck supple.  Cardiovascular: Normal rate, regular rhythm, normal heart sounds, intact distal pulses and normal pulses.   Pulmonary/Chest: Effort normal and breath sounds normal. No respiratory distress. He has no wheezes. He has no rales. He exhibits no tenderness.  Abdominal: Soft. Normal appearance and bowel sounds are normal. He exhibits no distension and no mass. There is tenderness in the epigastric area. There is no rigidity, no rebound and no guarding.  Musculoskeletal: Normal range of motion. He exhibits no edema or tenderness.       Cervical back: He exhibits no tenderness, no bony tenderness and no deformity.       Thoracic back: He exhibits no tenderness, no bony tenderness and no deformity.       Lumbar back: He exhibits no tenderness, no bony tenderness and no deformity.  No TTP of spine or paraspinal muscles; no midline tenderness, step-off, or deformity.  Neurological: He is alert and oriented to person, place, and time. He has normal strength. No sensory deficit.  Skin: Skin is warm, dry and intact. No rash noted. He is not diaphoretic. No erythema. No pallor.  Psychiatric: He has a normal mood and affect. His speech is normal and behavior is normal.  Judgment and thought content normal.  Nursing note and vitals reviewed.   ED Course  Procedures (including critical care time)  Labs Review Labs Reviewed  BASIC METABOLIC PANEL - Abnormal; Notable for the following:    Glucose, Bld 155 (*)    All other components within normal limits  CBC WITH DIFFERENTIAL/PLATELET  HEPATIC FUNCTION PANEL  LIPASE, BLOOD  URINALYSIS, ROUTINE W REFLEX MICROSCOPIC (NOT AT [facility])  Rosezena Sensor, ED    Imaging Review Dg Chest 2 View  06/19/2015   CLINICAL DATA:  54 year old male with intermittent shortness of breath and chest pain since August. Subsequent encounter.  EXAM: CHEST  2 VIEW  COMPARISON:  Chest CTA 05/28/2015 and earlier.  FINDINGS: Stable lung volumes. Normal cardiac size and mediastinal contours. Visualized tracheal air column is within normal limits. No pneumothorax, pulmonary edema, pleural effusion or acute pulmonary opacity. No acute osseous  abnormality identified.  IMPRESSION: No acute cardiopulmonary abnormality.   Electronically Signed   By: Odessa Fleming M.D.   On: 06/19/2015 11:47   Ct Abdomen Pelvis W Contrast  06/19/2015   CLINICAL DATA:  Nausea, vomiting, upper abdominal pain.  EXAM: CT ABDOMEN AND PELVIS WITH CONTRAST  TECHNIQUE: Multidetector CT imaging of the abdomen and pelvis was performed using the standard protocol following bolus administration of intravenous contrast.  CONTRAST:  OMNIPAQUE IOHEXOL 300 MG/ML  SOLN  COMPARISON:  None.  FINDINGS: Lower chest:  Unremarkable.  Hepatobiliary: No masses or other significant abnormality identified. There is hepatic steatosis.  Pancreas: No evidence of mass, inflammatory changes, or other significant abnormality.  Spleen:  Within normal limits in size and appearance.  Adrenal Glands:  No masses identified.  Kidneys/Urinary Tract: No evidence of urolithiasis or hydronephrosis. No solid or complex cystic renal masses identified. No masses or calculi seen involving the lower urinary tract.   Stomach/Bowel/Peritoneum: No evidence of wall thickening, mass, or obstruction. Enterotomy lines are seen along the sigmoid colon.  Vascular/Lymphatic: No pathologically enlarged lymph nodes identified. No other significant abnormality noted.  Reproductive: No masses or other significant abnormality identified. Prostate gland calcifications are seen.  Other:  None.  Musculoskeletal: There is a 6 mm sclerotic lesion within the right ilium. Osteoarthritic changes of the lower lumbosacral spine are seen. Findings of diffuse idiopathic skeletal hyperostosis of the lower thoracic spine also seen.  IMPRESSION: Severe hepatic steatosis.  Otherwise no evidence of abnormality within the solid abdominal organs.  Prior left colonic resection.  Prostate gland calcifications, nonspecific finding. Please correlate clinically.   Electronically Signed   By: Ted Mcalpine M.D.   On: 06/19/2015 12:54     I have personally reviewed and evaluated these images and lab results as part of my medical decision-making.   EKG Interpretation   Date/Time:  Thursday June 19 2015 09:42:36 EDT Ventricular Rate:  75 PR Interval:  148 QRS Duration: 78 QT Interval:  394 QTC Calculation: 439 R Axis:   36 Text Interpretation:  Normal sinus rhythm Normal ECG agree. no change from  old Confirmed by Donnald Garre, MD, Lebron Conners [phone]) on 06/19/2015 10:35:36 AM      MDM   Final diagnoses:  Abdominal pain  Chest pain, unspecified chest pain type   54 year old male presents with chest pain radiating to his left shoulder and back x several weeks. Reports pain with deep inspiration x 1 week. Denies exertional chest pain or dyspnea. Denies fever, chills, lightheadedness, dizziness, shortness of breath, N/V/D/C, dysuria. Reports abdominal pain, which he attributes to gas, and bloating. Reports urgency. Reports cough, unchanged from baseline.  Patient is afebrile. Vital signs stable. No tachycardia. Lungs clear to auscultation  bilaterally. No TTP of anterior chest wall. Abdomen soft, mild TTP of epigastrium. No lower extremity edema.  EKG normal sinus rhythm, no acute ischemia. Troponin negative x 1. Low suspicion for ACS. CBC within normal limits. BMP, hepatic function panel, lipase, UA unremarkable. CXR no acute cardiopulmonary disease, no pneumothorax, pulmonary edema, pleural effusion, or acute pulmonary opacity.  Will obtain CT abdomen pelvis to further evaluate patient's symptoms. CT abdomen pelvis demonstrates severe hepatic steatosis, otherwise no evidence of abnormality within the solid abdominal organs.  CT angio 05/28/15 revealed normal heart size, no pericardial effusion, no acute vascular abnormality including PE, no aortic dissection or aneurysm. Patient reports no new symptoms since that time. Feel patient is stable for discharge. Will treat pain with vicodin. Continue daily  prescribed PPI. Discussed dietary restrictions, and advised patient to start a low fat diet. Patient to follow up with PCP. He reports he has been referred to GI and cardiology for further work-up. Return precautions discussed.   BP 142/86 mmHg  Pulse 62  Temp(Src) 97.9 F (36.6 C) (Oral)  Resp 17  Ht 5\' 8"  (1.727 m)  Wt 250 lb (113.399 kg)  BMI 38.02 kg/m2  SpO2 87%     Mady Gemma, PA-C 06/19/15 1654  Arby Barrette, MD 06/19/15 1749

## 2015-06-19 NOTE — Discharge Instructions (Signed)
1. Medications: pantoprazole, vicodin, usual home medications 2. Treatment: rest, drink plenty of fluids, healthy diet  3. Follow Up: please followup with your primary doctor for discussion of your diagnoses and further evaluation after today's visit; please return to the ER for severe chest pain, shortness of breath, worsening abdominal pain, fever, vomiting   Chest Pain (Nonspecific) It is often hard to give a specific diagnosis for the cause of chest pain. There is always a chance that your pain could be related to something serious, such as a heart attack or a blood clot in the lungs. You need to follow up with your health care provider for further evaluation. CAUSES   Heartburn.  Pneumonia or bronchitis.  Anxiety or stress.  Inflammation around your heart (pericarditis) or lung (pleuritis or pleurisy).  A blood clot in the lung.  A collapsed lung (pneumothorax). It can develop suddenly on its own (spontaneous pneumothorax) or from trauma to the chest.  Shingles infection (herpes zoster virus). The chest wall is composed of bones, muscles, and cartilage. Any of these can be the source of the pain.  The bones can be bruised by injury.  The muscles or cartilage can be strained by coughing or overwork.  The cartilage can be affected by inflammation and become sore (costochondritis). DIAGNOSIS  Lab tests or other studies may be needed to find the cause of your pain. Your health care provider may have you take a test called an ambulatory electrocardiogram (ECG). An ECG records your heartbeat patterns over a 24-hour period. You may also have other tests, such as:  Transthoracic echocardiogram (TTE). During echocardiography, sound waves are used to evaluate how blood flows through your heart.  Transesophageal echocardiogram (TEE).  Cardiac monitoring. This allows your health care provider to monitor your heart rate and rhythm in real time.  Holter monitor. This is a portable device  that records your heartbeat and can help diagnose heart arrhythmias. It allows your health care provider to track your heart activity for several days, if needed.  Stress tests by exercise or by giving medicine that makes the heart beat faster. TREATMENT   Treatment depends on what may be causing your chest pain. Treatment may include:  Acid blockers for heartburn.  Anti-inflammatory medicine.  Pain medicine for inflammatory conditions.  Antibiotics if an infection is present.  You may be advised to change lifestyle habits. This includes stopping smoking and avoiding alcohol, caffeine, and chocolate.  You may be advised to keep your head raised (elevated) when sleeping. This reduces the chance of acid going backward from your stomach into your esophagus. Most of the time, nonspecific chest pain will improve within 2-3 days with rest and mild pain medicine.  HOME CARE INSTRUCTIONS   If antibiotics were prescribed, take them as directed. Finish them even if you start to feel better.  For the next few days, avoid physical activities that bring on chest pain. Continue physical activities as directed.  Do not use any tobacco products, including cigarettes, chewing tobacco, or electronic cigarettes.  Avoid drinking alcohol.  Only take medicine as directed by your health care provider.  Follow your health care provider's suggestions for further testing if your chest pain does not go away.  Keep any follow-up appointments you made. If you do not go to an appointment, you could develop lasting (chronic) problems with pain. If there is any problem keeping an appointment, call to reschedule. SEEK MEDICAL CARE IF:   Your chest pain does not go away, even  after treatment.  You have a rash with blisters on your chest.  You have a fever. SEEK IMMEDIATE MEDICAL CARE IF:   You have increased chest pain or pain that spreads to your arm, neck, jaw, back, or abdomen.  You have shortness of  breath.  You have an increasing cough, or you cough up blood.  You have severe back or abdominal pain.  You feel nauseous or vomit.  You have severe weakness.  You faint.  You have chills. This is an emergency. Do not wait to see if the pain will go away. Get medical help at once. Call your local emergency services (911 in [place] Do not drive yourself to the hospital. MAKE SURE YOU:   Understand these instructions.  Will watch your condition.  Will get help right away if you are not doing well or get worse. Document Released: 07/07/2005 Document Revised: 10/02/2013 Document Reviewed: 05/02/2008 [facility] Patient Information 2015 [place], [place]. This information is not intended to replace advice given to you by your health care provider. Make sure you discuss any questions you have with your health care provider.  Abdominal Pain Many things can cause abdominal pain. Usually, abdominal pain is not caused by a disease and will improve without treatment. It can often be observed and treated at home. Your health care provider will do a physical exam and possibly order blood tests and X-rays to help determine the seriousness of your pain. However, in many cases, more time must pass before a clear cause of the pain can be found. Before that point, your health care provider may not know if you need more testing or further treatment. HOME CARE INSTRUCTIONS  Monitor your abdominal pain for any changes. The following actions may help to alleviate any discomfort you are experiencing:  Only take over-the-counter or prescription medicines as directed by your health care provider.  Do not take laxatives unless directed to do so by your health care provider.  Try a clear liquid diet (broth, tea, or water) as directed by your health care provider. Slowly move to a bland diet as tolerated. SEEK MEDICAL CARE IF:  You have unexplained abdominal pain.  You have abdominal pain associated with nausea  or diarrhea.  You have pain when you urinate or have a bowel movement.  You experience abdominal pain that wakes you in the night.  You have abdominal pain that is worsened or improved by eating food.  You have abdominal pain that is worsened with eating fatty foods.  You have a fever. SEEK IMMEDIATE MEDICAL CARE IF:   Your pain does not go away within 2 hours.  You keep throwing up (vomiting).  Your pain is felt only in portions of the abdomen, such as the right side or the left lower portion of the abdomen.  You pass bloody or black tarry stools. MAKE SURE YOU:  Understand these instructions.   Will watch your condition.   Will get help right away if you are not doing well or get worse.  Document Released: 07/07/2005 Document Revised: 10/02/2013 Document Reviewed: 06/06/2013 [facility] Patient Information 2015 [place], [place]. This information is not intended to replace advice given to you by your health care provider. Make sure you discuss any questions you have with your health care provider.  Cardiac Diet   A cardiac diet can help stop heart disease or a stroke from happening. It involves eating less unhealthy fats and eating more healthy fats.  FOODS TO AVOID OR LIMIT  Limit  saturated fats. This type of fat is found in oils and dairy products, such as:  Coconut oil.  Palm oil.  Cocoa butter.  Butter. Avoid trans-fat or hydrogenated oils. These are found in fried or pre-made baked goods, such as:  Margarine.  Pre-made cookies, cakes, and crackers. Limit processed meats (hot dogs, deli meats, sausage) to 3 ounces a week.  Limit high-fat meats (marbled meats, fried chicken, or chicken with skin) to 3 ounces a week.  Limit salt (sodium) to 1500 milligrams a day.   Limit sweets and drinks with added sugar to no more than 5 servings a week. One serving is:  1 tablespoon of sugar.  1 tablespoon of jelly or jam.   cup sorbet.  1 cup lemonade.   cup regular  soda. EAT MORE OF THE FOLLOWING FOODS  Fruit  Eat 4 to 5 servings a day. One serving of fruit is:  1 medium whole fruit.   cup dried fruit.   cup of fresh, frozen, or canned fruit.   cup 100% fruit juice. Vegetables  Eat 4 to 5 servings a day. One serving is:  1 cup raw leafy vegetables.   cup raw or cooked, cut-up vegetables.   cup vegetable juice. Whole Grains  Eat 3 servings a day (1 ounce equals 1 serving). Legumes (such as beans, peas, and lentils)  Eat at least 4 servings a week ( cup equals 1 serving). Nuts and Seeds  Eat at least 4 servings a week ( cup equals 1 serving). Dietary Fiber  Eat 20 to 30 grams a day. Some foods high in dietary fiber include:  Dried beans.  Citrus fruits.  Apples, bananas.  Broccoli, Brussels sprouts, and eggplant.  Oats. Omega-3 Fats  Eat food with omega-3 fats. You can also take a dietary pill (supplement) that has 1 gram of DHA and EPA. Have 3.5 ounces of fatty fish a week, such as:  Salmon.  Mackerel.  [place] tuna.  Sardines.  Lake trout.  Herring. PREPARING YOUR FOOD  Broil, bake, steam, or roast foods. Do not fry food. Do not cook food in butter (fat).  Use non-stick cooking sprays.  Remove skin from poultry, such as chicken and [place].  Remove fat from meat.  Take the fat off the top of stews, soups, and gravy.  Use lemon or herbs to flavor food instead of using butter or margarine.  Use nonfat yogurt, salsa, or low-fat dressings for salads. Document Released: 03/28/2012 Document Reviewed: 03/28/2012  [facility] Patient Information 2015 [place], [place]. This information is not intended to replace advice given to you by your health care provider. Make sure you discuss any questions you have with your health care provider.

## 2015-06-19 NOTE — ED Notes (Signed)
PA at bedside.

## 2015-06-19 NOTE — ED Notes (Signed)
Chest pain from sternum to shoulder; worse with breathing usually but today it is constant. PCP saw him and thought its belly issues. Also ED at [facility] a few weeks ago for this. Discharged due to noncardiac. Pt concerned bc not getting better and no one seems to have any info for him. MI when 42.

## 2015-06-19 NOTE — ED Notes (Signed)
Patient transported to CT 

## 2015-07-07 ENCOUNTER — Encounter: Payer: Self-pay | Admitting: Gastroenterology

## 2015-07-16 ENCOUNTER — Encounter (HOSPITAL_COMMUNITY): Payer: Self-pay | Admitting: Emergency Medicine

## 2015-07-16 ENCOUNTER — Emergency Department (HOSPITAL_COMMUNITY)
Admission: EM | Admit: 2015-07-16 | Discharge: 2015-07-16 | Disposition: A | Discharge status: Home / Self Care | Payer: BLUE CROSS/BLUE SHIELD | Attending: Emergency Medicine | Admitting: Emergency Medicine

## 2015-07-16 DIAGNOSIS — E669 Obesity, unspecified: Secondary | ICD-10-CM | POA: Diagnosis not present

## 2015-07-16 DIAGNOSIS — G8929 Other chronic pain: Secondary | ICD-10-CM | POA: Diagnosis not present

## 2015-07-16 DIAGNOSIS — I2510 Atherosclerotic heart disease of native coronary artery without angina pectoris: Secondary | ICD-10-CM | POA: Insufficient documentation

## 2015-07-16 DIAGNOSIS — Z87442 Personal history of urinary calculi: Secondary | ICD-10-CM | POA: Diagnosis not present

## 2015-07-16 DIAGNOSIS — Z8739 Personal history of other diseases of the musculoskeletal system and connective tissue: Secondary | ICD-10-CM

## 2015-07-16 DIAGNOSIS — I252 Old myocardial infarction: Secondary | ICD-10-CM | POA: Insufficient documentation

## 2015-07-16 DIAGNOSIS — M2550 Pain in unspecified joint: Secondary | ICD-10-CM | POA: Insufficient documentation

## 2015-07-16 DIAGNOSIS — Z79899 Other long term (current) drug therapy: Secondary | ICD-10-CM | POA: Insufficient documentation

## 2015-07-16 DIAGNOSIS — Z9889 Other specified postprocedural states: Secondary | ICD-10-CM | POA: Diagnosis not present

## 2015-07-16 DIAGNOSIS — M109 Gout, unspecified: Secondary | ICD-10-CM | POA: Diagnosis present

## 2015-07-16 DIAGNOSIS — N189 Chronic kidney disease, unspecified: Secondary | ICD-10-CM | POA: Diagnosis not present

## 2015-07-16 DIAGNOSIS — K219 Gastro-esophageal reflux disease without esophagitis: Secondary | ICD-10-CM | POA: Diagnosis not present

## 2015-07-16 MED ORDER — PROMETHAZINE HCL 12.5 MG PO TABS
12.5000 mg | ORAL_TABLET | Freq: Once | ORAL | Status: AC
  Administered 2015-07-16: 12.5 mg via ORAL
  Filled 2015-07-16: qty 1

## 2015-07-16 MED ORDER — HYDROCODONE-ACETAMINOPHEN 5-325 MG PO TABS: 1.0000 | ORAL_TABLET | ORAL | Status: DC | PRN

## 2015-07-16 MED ORDER — DEXAMETHASONE SODIUM PHOSPHATE 4 MG/ML IJ SOLN
8.0000 mg | Freq: Once | INTRAMUSCULAR | Status: AC
  Administered 2015-07-16: 8 mg via INTRAMUSCULAR
  Filled 2015-07-16: qty 2

## 2015-07-16 MED ORDER — DEXAMETHASONE 4 MG PO TABS: 4.0000 mg | ORAL_TABLET | Freq: Two times a day (BID) | ORAL | Status: DC

## 2015-07-16 MED ORDER — MORPHINE SULFATE (PF) 4 MG/ML IV SOLN
8.0000 mg | Freq: Once | INTRAVENOUS | Status: AC
  Administered 2015-07-16: 8 mg via INTRAMUSCULAR
  Filled 2015-07-16: qty 2

## 2015-07-16 MED ORDER — INDOMETHACIN 25 MG PO CAPS
25.0000 mg | ORAL_CAPSULE | Freq: Once | ORAL | Status: AC
  Administered 2015-07-16: 25 mg via ORAL
  Filled 2015-07-16: qty 1

## 2015-07-16 MED ORDER — INDOMETHACIN 25 MG PO CAPS: 25.0000 mg | ORAL_CAPSULE | Freq: Three times a day (TID) | ORAL | Status: DC | PRN

## 2015-07-16 NOTE — ED Provider Notes (Signed)
CSN: [id number]     Arrival date & time 07/16/15  0732 History   First MD Initiated Contact with Patient 07/16/15 0831     Chief Complaint  Patient presents with  . Gout     (Consider location/radiation/quality/duration/timing/severity/associated sxs/prior Treatment) HPI Comments: Patient is a 54 year old male who presents to the emergency department with a complaint of right elbow pain, and some left knee pain. The patient has a history of gout arthritis, gastroesophageal reflux disease, coronary artery disease. Patient states that over the last 2 or 3 days he's been having increasing pain of the right elbow. Today the pain was excruciating. He noticed some swelling. He states it feels a lot like the gout problems he has had in the past. The patient is also beginning to notice some mild to moderate pain in the left knee. There is no significant swelling. And the joint is not hot, but he has some pain with flexion and extension, as well as with standing. He has not had any high fevers reported. No nausea, or vomiting. No injury reported to the knees or to the elbows. He has tried Indocin, with only minimal improvement.  The history is provided by the patient.    Past Medical History  Diagnosis Date  . Gout   . Obesity   . Diverticulosis   . Myocardial infarction (HCC)     mild Heart attack 2002  . Chronic kidney disease   . Renal stone     passed   . Seizures (HCC)     as a child  . GERD (gastroesophageal reflux disease)     no medical treatment  . Coronary artery disease    Past Surgical History  Procedure Laterality Date  . Partial colectomy      Dr Malvin Johns  . Cardiac catheterization    . Throat biop    . Appendectomy    . Back surgery      x5 surgeries   History reviewed. No pertinent family history. Social History  Substance Use Topics  . Smoking status: Never Smoker   . Smokeless tobacco: Former Neurosurgeon    Types: Chew  . Alcohol Use: No     Comment: none x 1 year     Review of Systems  Musculoskeletal: Positive for arthralgias.  All other systems reviewed and are negative.     Allergies  Review of patient's allergies indicates no known allergies.  Home Medications   Prior to Admission medications   Medication Sig Start Date End Date Taking? Authorizing Provider  Aspirin-Salicylamide-Caffeine (BC HEADACHE POWDER PO) Take 1 Package by mouth daily as needed (headaches).    Historical Provider, MD  colchicine 0.6 MG tablet Take 1 tablet (0.6 mg total) by mouth 2 (two) times daily as needed (gout). Patient not taking: Reported on 06/19/2015 06/02/15   Raeford Razor, MD  dexamethasone (DECADRON) 4 MG tablet Take 1 tablet (4 mg total) by mouth 2 (two) times daily with a meal. 07/16/15   Ivery Quale, PA-C  HYDROcodone-acetaminophen (NORCO/VICODIN) 5-325 MG tablet Take 1-2 tablets by mouth every 4 (four) hours as needed. 07/16/15   Ivery Quale, PA-C  indomethacin (INDOCIN) 25 MG capsule Take 1 capsule (25 mg total) by mouth 3 (three) times daily as needed. 07/16/15   Ivery Quale, PA-C  oxyCODONE-acetaminophen (PERCOCET/ROXICET) 5-325 MG per tablet Take 1-2 tablets by mouth every 4 (four) hours as needed. 06/02/15   Raeford Razor, MD  pantoprazole (PROTONIX) 40 MG tablet Take 40 mg by mouth daily.  Historical Provider, MD  simethicone (GAS-X) 80 MG chewable tablet Chew 80 mg by mouth every 6 (six) hours as needed for flatulence.    Historical Provider, MD   BP 138/89 mmHg  Pulse 74  Temp(Src) 97.8 F (36.6 C) (Oral)  Resp 18  Ht  (1.727 m)  Wt 252 lb (114.306 kg)  BMI 38.33 kg/m2  SpO2 96% Physical Exam  Constitutional: He is oriented to person, place, and time. He appears well-developed and well-nourished.  Non-toxic appearance.  HENT:  Head: Normocephalic.  Right Ear: Tympanic membrane and external ear normal.  Left Ear: Tympanic membrane and external ear normal.  Eyes: EOM and lids are normal. Pupils are equal, round, and reactive to  light.  Neck: Normal range of motion. Neck supple. Carotid bruit is not present.  Cardiovascular: Normal rate, regular rhythm, normal heart sounds, intact distal pulses and normal pulses.   Pulmonary/Chest: Breath sounds normal. No respiratory distress.  Abdominal: Soft. Bowel sounds are normal. There is no tenderness. There is no guarding.  Musculoskeletal:       Right elbow: He exhibits decreased range of motion and swelling. He exhibits no effusion and no deformity. Tenderness found. Lateral epicondyle and olecranon process tenderness noted.       Left knee: He exhibits decreased range of motion. He exhibits no effusion, no deformity and no erythema. Tenderness found. Lateral joint line tenderness noted.  Lymphadenopathy:       Head (right side): No submandibular adenopathy present.       Head (left side): No submandibular adenopathy present.    He has no cervical adenopathy.  Neurological: He is alert and oriented to person, place, and time. He has normal strength. No cranial nerve deficit or sensory deficit.  Skin: Skin is warm and dry.  Psychiatric: He has a normal mood and affect. His speech is normal.  Nursing note and vitals reviewed.   ED Course Patient treated in the emergency department with intramuscular Decadron, intramuscular morphine, and oral Indocin.   Procedures (including critical care time) Labs Review Labs Reviewed - No data to display  Imaging Review No results found. I have personally reviewed and evaluated these images and lab results as part of my medical decision-making.   EKG Interpretation None      MDM  Patient presents to the emergency department with elbow pain and left knee pain. He states this is very similar to previous gout attacks. The joints are not hot. Doubt a septic joint situation. Is no history of recent injury or trauma. The patient will be treated with Decadron, Norco, and Indocin. The patient is to follow-up with his primary physician  Dr. Phillips Odor for additional evaluation and management. Patient is in agreement with this discharge plan.    Final diagnoses:  Pain, joint, multiple sites  History of acute gouty arthritis    **I have reviewed nursing notes, vital signs, and all appropriate lab and imaging results for this patient.Ivery Quale, PA-C 07/16/15 1610  Bethann Berkshire, MD 07/17/15 [phone]

## 2015-07-16 NOTE — Discharge Instructions (Signed)
Please let Dr. Phillips Odor know about your breakthrough pain today. Please schedule an appointment with him, or member of his team to assist with management of your gout and arthritis. Use Indocin and Decadron daily with food. Use Norco for pain if needed. Norco may cause drowsiness, please use this medication with caution. Joint Pain Joint pain, which is also called arthralgia, can be caused by many things. Joint pain often goes away when you follow your health care provider's instructions for relieving pain at home. However, joint pain can also be caused by conditions that require further treatment. Common causes of joint pain include:  Bruising in the area of the joint.  Overuse of the joint.  Wear and tear on the joints that occur with aging (osteoarthritis).  Various other forms of arthritis.  A buildup of a crystal form of uric acid in the joint (gout).  Infections of the joint (septic arthritis) or of the bone (osteomyelitis). Your health care provider may recommend medicine to help with the pain. If your joint pain continues, additional tests may be needed to diagnose your condition. HOME CARE INSTRUCTIONS Watch your condition for any changes. Follow these instructions as directed to lessen the pain that you are feeling.  Take medicines only as directed by your health care provider.  Rest the affected area for as long as your health care provider says that you should. If directed to do so, raise the painful joint above the level of your heart while you are sitting or lying down.  Do not do things that cause or worsen pain.  If directed, apply ice to the painful area:  Put ice in a plastic bag.  Place a towel between your skin and the bag.  Leave the ice on for 20 minutes, 2-3 times per day.  Wear an elastic bandage, splint, or sling as directed by your health care provider. Loosen the elastic bandage or splint if your fingers or toes become numb and tingle, or if they turn cold  and blue.  Begin exercising or stretching the affected area as directed by your health care provider. Ask your health care provider what types of exercise are safe for you.  Keep all follow-up visits as directed by your health care provider. This is important. SEEK MEDICAL CARE IF:  Your pain increases, and medicine does not help.  Your joint pain does not improve within 3 days.  You have increased bruising or swelling.  You have a fever.  You lose 10 lb (4.5 kg) or more without trying. SEEK IMMEDIATE MEDICAL CARE IF:  You are not able to move the joint.  Your fingers or toes become numb or they turn cold and blue.   This information is not intended to replace advice given to you by your health care provider. Make sure you discuss any questions you have with your health care provider.   Document Released: 09/27/2005 Document Revised: 10/18/2014 Document Reviewed: 07/09/2014 Elsevier Interactive Patient Education [employer]

## 2015-07-16 NOTE — ED Notes (Signed)
Pt states that he has gout and is hurting in right elbow and right knee.  Started yesterday and took Indocin without change.

## 2015-07-22 ENCOUNTER — Other Ambulatory Visit: Payer: Self-pay

## 2015-07-22 ENCOUNTER — Ambulatory Visit (INDEPENDENT_AMBULATORY_CARE_PROVIDER_SITE_OTHER): Payer: BLUE CROSS/BLUE SHIELD | Admitting: Gastroenterology

## 2015-07-22 ENCOUNTER — Encounter: Payer: Self-pay | Admitting: Gastroenterology

## 2015-07-22 VITALS — BP 160/100 | HR 52 | Temp 97.4°F | Ht 68.0 in | Wt 255.4 lb

## 2015-07-22 DIAGNOSIS — K219 Gastro-esophageal reflux disease without esophagitis: Secondary | ICD-10-CM | POA: Diagnosis not present

## 2015-07-22 DIAGNOSIS — Z1211 Encounter for screening for malignant neoplasm of colon: Secondary | ICD-10-CM | POA: Insufficient documentation

## 2015-07-22 DIAGNOSIS — Z8371 Family history of colonic polyps: Secondary | ICD-10-CM | POA: Diagnosis not present

## 2015-07-22 DIAGNOSIS — R1314 Dysphagia, pharyngoesophageal phase: Secondary | ICD-10-CM | POA: Diagnosis not present

## 2015-07-22 DIAGNOSIS — R1013 Epigastric pain: Secondary | ICD-10-CM

## 2015-07-22 DIAGNOSIS — K760 Fatty (change of) liver, not elsewhere classified: Secondary | ICD-10-CM | POA: Diagnosis not present

## 2015-07-22 DIAGNOSIS — R1319 Other dysphagia: Secondary | ICD-10-CM | POA: Insufficient documentation

## 2015-07-22 DIAGNOSIS — R1310 Dysphagia, unspecified: Secondary | ICD-10-CM

## 2015-07-22 MED ORDER — PEG 3350-KCL-NA BICARB-NACL 420 G PO SOLR: 4000.0000 mL | ORAL | Status: DC

## 2015-07-22 NOTE — Progress Notes (Signed)
Primary Care Physician:  Colette Ribas, MD  Primary Gastroenterologist:  Roetta Sessions, MD   Chief Complaint  Patient presents with  . Gastrophageal Reflux    pt denies  . Abdominal Pain    HPI:  John Wallace is a 54 y.o. male here for further evaluation of atypical chest pain, abdominal pain. Three ER visits since 04/2015. CTA chest and CT A/P as outlined below. Unremarkable except for marked hepatic steatosis. Has since seen cardiology at [facility] in [place]. Exercise stress test unremarkable.   He wonders if symptoms partially related to depression. Had been on vacation visiting grandkids and didn't want to leave. Three "attacks" within first week back. Felt like chest pressure and SOB. Seen in ER and by PCP. PCP started PPI. No further "attacks" but still having abdominal pain. Had h/o nocturnal regurgitation/heartburn. Wake up choking. Eats late and lays down. Symptoms improved on PPI. Still having postprandial epigastric pain wraps around to back, associated with upper abdominal swelling. No further vomiting. Used to have vomiting with meals. Chronically feels like he washes down food but doesn't know if that's abnormal. No worse than usual. BM regular. No melena, brbpr.   Takes Vicodin, decadron, indomethacin prn gout. H/o etoh use, intermittent, with occasionally binging in past but none in three year.    Drinking a lot coke. Used to drink a case a day, down to twelve pack, and now maybe 1 liter. Feels better with exercising. Walking regularly for about a month but gained 20 pounds and got frustrated.    Son had colonoscopy complicated by colonic perforation, removal of multiple colon polyps.   Current Outpatient Prescriptions  Medication Sig Dispense Refill  . dexamethasone (DECADRON) 4 MG tablet Take 1 tablet (4 mg total) by mouth 2 (two) times daily with a meal. 12 tablet 0  . HYDROcodone-acetaminophen (NORCO/VICODIN) 5-325 MG tablet Take 1-2 tablets by mouth every 4 (four)  hours as needed. 20 tablet 0  . indomethacin (INDOCIN) 25 MG capsule Take 1 capsule (25 mg total) by mouth 3 (three) times daily as needed. 30 capsule 0  . pantoprazole (PROTONIX) 40 MG tablet Take 40 mg by mouth daily.    . simethicone (GAS-X) 80 MG chewable tablet Chew 80 mg by mouth every 6 (six) hours as needed for flatulence.     No current facility-administered medications for this visit.   Facility-Administered Medications Ordered in Other Visits  Medication Dose Route Frequency Provider Last Rate Last Dose  . gentamicin (GARAMYCIN) 440 mg in dextrose 5 % 100 mL IVPB  440 mg Intravenous 30 min Pre-Op Sebastian Ache, MD        Allergies as of 07/22/2015  . (No Known Allergies)    Past Medical History  Diagnosis Date  . Gout   . Obesity   . Diverticulosis   . Myocardial infarction (HCC)     mild Heart attack 2002  . Chronic kidney disease   . Renal stone     passed   . Seizures (HCC)     as a child  . GERD (gastroesophageal reflux disease)     no medical treatment  . Coronary artery disease     Past Surgical History  Procedure Laterality Date  . Partial colectomy      Dr Bradford:diverticulosis  . Cardiac catheterization    . Throat biop      benign  . Appendectomy    . Back surgery      x5 surgeries  . Colonoscopy  2003    RMR: Normal appearing colonic mucosa to 35-40 cm. Spasm versus persistent swelling and noncompliance of the colon along with patient's discomfort precluded completion of exam.    Family History  Problem Relation Age of Onset  . Other      doesn't know family history    Social History   Social History  . Marital Status: Married    Spouse Name: N/A  . Number of Children: 5  . Years of Education: N/A   Occupational History  . [employer]    Social History Main Topics  . Smoking status: Never Smoker   . Smokeless tobacco: Former Neurosurgeon    Types: Chew  . Alcohol Use: No     Comment: none since 2013, not regular drinker before  but would occasional binging  . Drug Use: No  . Sexual Activity: Not on file   Other Topics Concern  . Not on file   Social History Narrative      ROS:  General: Negative for anorexia, weight loss, fever, chills, fatigue, weakness. Eyes: Negative for vision changes.  ENT: Negative for hoarseness, difficulty swallowing , nasal congestion. CV: Negative for chest pain, angina, palpitations, dyspnea on exertion, peripheral edema.  Respiratory: Negative for dyspnea at rest, dyspnea on exertion, cough, sputum, wheezing.  GI: See history of present illness. GU:  Negative for dysuria, hematuria, urinary incontinence, urinary frequency, nocturnal urination.  MS: Negative for joint pain, low back pain.  Derm: Negative for rash or itching.  Neuro: Negative for weakness, abnormal sensation, seizure, frequent headaches, memory loss, confusion.  Psych: H/O depression. No suicidal ideation, hallucinations.  Endo: Negative for unusual weight change.  Heme: Negative for bruising or bleeding. Allergy: Negative for rash or hives.    Physical Examination:  BP 160/100 mmHg  Pulse 52  Temp(Src) 97.4 F (36.3 C)  Ht  (1.727 m)  Wt 255 lb 6.4 oz (115.849 kg)  BMI 38.84 kg/m2   General: Well-nourished, well-developed in no acute distress.  Head: Normocephalic, atraumatic.   Eyes: Conjunctiva pink, no icterus. Mouth: Oropharyngeal mucosa moist and pink , no lesions erythema or exudate. Neck: Supple without thyromegaly, masses, or lymphadenopathy.  Lungs: Clear to auscultation bilaterally.  Heart: Regular rate and rhythm, no murmurs rubs or gallops.  Abdomen: Bowel sounds are normal, mild epigastric tenderness, nondistended, no hepatosplenomegaly or masses, no abdominal bruits or    hernia , no rebound or guarding.   Rectal: not performed Extremities: No lower extremity edema. No clubbing or deformities.  Neuro: Alert and oriented x 4 , grossly normal neurologically.  Skin: Warm and dry,  no rash or jaundice.   Psych: Alert and cooperative, normal mood and affect.  Labs: Lab Results  Component Value Date   WBC 7.4 06/19/2015   HGB 14.9 06/19/2015   HCT 45.9 06/19/2015   MCV 88.3 06/19/2015   PLT 235 06/19/2015   Lab Results  Component Value Date   CREATININE 0.85 06/19/2015   BUN 10 06/19/2015   NA 139 06/19/2015   K 3.8 06/19/2015   CL 104 06/19/2015   CO2 27 06/19/2015   Lab Results  Component Value Date   ALT 46 06/19/2015   AST 38 06/19/2015   ALKPHOS 71 06/19/2015   BILITOT 0.7 06/19/2015   Lab Results  Component Value Date   LIPASE 24 06/19/2015     Imaging Studies:   CLINICAL DATA: Nausea, vomiting, upper abdominal pain.  EXAM: CT ABDOMEN AND PELVIS WITH CONTRAST  TECHNIQUE:  Multidetector CT imaging of the abdomen and pelvis was performed using the standard protocol following bolus administration of intravenous contrast.  CONTRAST: 100mL OMNIPAQUE IOHEXOL 300 MG/ML SOLN  COMPARISON: None.  FINDINGS: Lower chest: Unremarkable.  Hepatobiliary: No masses or other significant abnormality identified. There is hepatic steatosis.  Pancreas: No evidence of mass, inflammatory changes, or other significant abnormality.  Spleen: Within normal limits in size and appearance.  Adrenal Glands: No masses identified.  Kidneys/Urinary Tract: No evidence of urolithiasis or hydronephrosis. No solid or complex cystic renal masses identified. No masses or calculi seen involving the lower urinary tract.  Stomach/Bowel/Peritoneum: No evidence of wall thickening, mass, or obstruction. Enterotomy lines are seen along the sigmoid colon.  Vascular/Lymphatic: No pathologically enlarged lymph nodes identified. No other significant abnormality noted.  Reproductive: No masses or other significant abnormality identified. Prostate gland calcifications are seen.  Other: None.  Musculoskeletal: There is a 6 mm sclerotic lesion  within the right ilium. Osteoarthritic changes of the lower lumbosacral spine are seen. Findings of diffuse idiopathic skeletal hyperostosis of the lower thoracic spine also seen.  IMPRESSION: Severe hepatic steatosis.  Otherwise no evidence of abnormality within the solid abdominal organs.  Prior left colonic resection.  Prostate gland calcifications, nonspecific finding. Please correlate clinically.   Electronically Signed  By: Ted Mcalpineobrinka Dimitrova M.D.  On: 06/19/2015 12:54  CLINICAL DATA: Acute onset shortness of breath. Recent air travel.  EXAM: CT ANGIOGRAPHY CHEST WITH CONTRAST  TECHNIQUE: Multidetector CT imaging of the chest was performed using the standard protocol during bolus administration of intravenous contrast. Multiplanar CT image reconstructions and MIPs were obtained to evaluate the vascular anatomy.  CONTRAST: 100mL OMNIPAQUE IOHEXOL 350 MG/ML SOLN  COMPARISON: None.  FINDINGS: THORACIC INLET/BODY WALL:  No acute abnormality.  MEDIASTINUM:  Normal heart size. No pericardial effusion. No acute vascular abnormality, including evidence of pulmonary embolism. No aortic dissection or aneurysm. No adenopathy.  LUNG WINDOWS:  No consolidation. No effusion. No suspicious pulmonary nodule.  UPPER ABDOMEN:  Marked hepatic steatosis.  OSSEOUS:  No acute fracture. No suspicious lytic or blastic lesions.  Review of the MIP images confirms the above findings.  IMPRESSION: 1. No evidence of pulmonary embolism or other acute disease. 2. Marked hepatic steatosis.   Electronically Signed  By: Marnee SpringJonathon Watts M.D.  On: 05/28/2015 05:00

## 2015-07-22 NOTE — Assessment & Plan Note (Signed)
?  esophageal dysphagia. Patient states he has "always" had to wash his food down or it gets stuck. Consider esophageal dilation at time of EGD.

## 2015-07-22 NOTE — Assessment & Plan Note (Signed)
Recent ER visits for epigastric and chest pain. H/o nocturnal reflux, improved on PPI therapy. No prior EGD. Still with pp epigastric pain, bloating. gb remains in site. Recommend EGD in near future +/- ED with deep sedation given h/o inadequate conscious sedation in the past.  I have discussed the risks, alternatives, benefits with regards to but not limited to the risk of reaction to medication, bleeding, infection, perforation and the patient is agreeable to proceed. Written consent to be obtained.  Continue PPI. Strive to slow gradual weight loss, antireflux measures. Patient drinking excessive Coke and discussed effects on health and weight. Encouraged significant reduction.

## 2015-07-22 NOTE — Progress Notes (Signed)
cc'ed to pcp °

## 2015-07-22 NOTE — Patient Instructions (Signed)
1. Continue pantoprazole daily. 2. Upper endoscopy with colonoscopy with Dr. Jena Gauss. See separate instructions.   Instructions for fatty liver: Recommend 1-2# weight loss per week until ideal body weight through exercise & diet. Low fat/cholesterol diet.   Avoid sweets, sodas, fruit juices, sweetened beverages like tea, etc. Gradually increase exercise from 15 min daily up to 1 hr per day 5 days/week. Limit alcohol use.

## 2015-07-22 NOTE — Assessment & Plan Note (Signed)
Marked steatosis noted on recent CTs. Denies chronic etoh abuse. BMI 38. Patient does not know his family history. LFTs normal. Suspect NASH.   Initially, await EGD. Consider elastography as next step.   Instructions for fatty liver: Recommend 1-2# weight loss per week until ideal body weight through exercise & diet. Low fat/cholesterol diet.   Avoid sweets, sodas, fruit juices, sweetened beverages like tea, etc. Gradually increase exercise from 15 min daily up to 1 hr per day 5 days/week. Limit alcohol use.

## 2015-07-22 NOTE — Assessment & Plan Note (Signed)
54 y/o male due for screening colonoscopy. Exam limited due to spasm vs swelling and noncompliance of colon and patient's discomfort in 2003. Subsequently had partial colectomy for diverticular disease. Patient has son who had polyps removed at time of colonoscopy, complicated by bowel perforation.  Colonoscopy in near future with deep sedation given prior history of inadequate conscious sedation. ?consider pediatric scope.  I have discussed the risks, alternatives, benefits with regards to but not limited to the risk of reaction to medication, bleeding, infection, perforation and the patient is agreeable to proceed. Written consent to be obtained.

## 2015-07-24 NOTE — Patient Instructions (Signed)
John GarreRobert R Wallace  07/24/2015     @PREFPERIOPPHARMACY @   Your procedure is scheduled on  08/07/2015  [facility] at  1000  A.M.  Call this number if you have problems the morning of surgery:  [phone]   Remember:  Do not eat food or drink liquids after midnight.  Take these medicines the morning of surgery with A SIP OF WATER: Decadron,hydrocodone, indocin, protonix.   Do not wear jewelry, make-up or nail polish.  Do not wear lotions, powders, or perfumes.  You may wear deodorant.  Do not shave 48 hours prior to surgery.  Men may shave face and neck.  Do not bring valuables to the hospital.  [facility] not [facility] any belongings or valuables.  Contacts, dentures or bridgework may not be worn into surgery.  Leave your suitcase in the car.  After surgery it may be brought to your room.  For patients admitted to the hospital, discharge time will be determined by your treatment team.  Patients discharged the day of surgery will not be allowed to drive home.   Name and phone number of your driver:   family Special instructions:  Follow the diet and prep instructions given to you by [name]'s office.  Please read over the following fact sheets that you were given. Pain Booklet, Coughing and Deep Breathing, Surgical Site Infection Prevention, Anesthesia Post-op Instructions and Care and Recovery After Surgery      Colonoscopy A colonoscopy is an exam to look at the entire large intestine (colon). This exam can help find problems such as tumors, polyps, inflammation, and areas of bleeding. The exam takes about 1 hour.  LET [facility]ER KNOW ABOUT:   Any allergies you have.  All medicines you are taking, including vitamins, herbs, eye drops, creams, and over-the-counter medicines.  Previous problems you or members of your family have had with the use of anesthetics.  Any blood disorders you have.  Previous surgeries you  have had.  Medical conditions you have. RISKS AND COMPLICATIONS  Generally, this is a safe procedure. However, as with any procedure, complications can occur. Possible complications include:  Bleeding.  Tearing or rupture of the colon wall.  Reaction to medicines given during the exam.  Infection (rare). BEFORE THE PROCEDURE   Ask your health care provider about changing or stopping your regular medicines.  You may be prescribed an oral bowel prep. This involves drinking a large amount of medicated liquid, starting the day before your procedure. The liquid will cause you to have multiple loose stools until your stool is almost clear or light green. This cleans out your colon in preparation for the procedure.  Do not eat or drink anything else once you have started the bowel prep, unless your health care provider tells you it is safe to do so.  Arrange for someone to drive you home after the procedure. PROCEDURE   You will be given medicine to help you relax (sedative).  You will lie on your side with your knees bent.  A long, flexible tube with a light and camera on the end (colonoscope) will be inserted through the rectum and into the colon. The camera sends video back to a computer screen as it moves through the colon. The colonoscope also releases carbon dioxide gas to inflate the colon. This helps your health care provider see the area better.  During the exam, your  health care provider may take a small tissue sample (biopsy) to be examined under a microscope if any abnormalities are found.  The exam is finished when the entire colon has been viewed. AFTER THE PROCEDURE   Do not drive for 24 hours after the exam.  You may have a small amount of blood in your stool.  You may pass moderate amounts of gas and have mild abdominal cramping or bloating. This is caused by the gas used to inflate your colon during the exam.  Ask when your test results will be ready and how you  will get your results. Make sure you get your test results.   This information is not intended to replace advice given to you by your health care provider. Make sure you discuss any questions you have with your health care provider.   Document Released: 09/24/2000 Document Revised: 07/18/2013 Document Reviewed: 06/04/2013 Elsevier Interactive Patient Education 2016 [employer] Esophagogastroduodenoscopy Esophagogastroduodenoscopy (EGD) is a procedure that is used to examine the lining of the esophagus, stomach, and first part of the small intestine (duodenum). A long, flexible, lighted tube with a camera attached (endoscope) is inserted down the throat to view these organs. This procedure is done to detect problems or abnormalities, such as inflammation, bleeding, ulcers, or growths, in order to treat them. The procedure lasts 5-20 minutes. It is usually an outpatient procedure, but it may need to be performed in a hospital in emergency cases. LET [name] CARE PROVIDER KNOW ABOUT:  Any allergies you have.  All medicines you are taking, including vitamins, herbs, eye drops, creams, and over-the-counter medicines.  Previous problems you or members of your family have had with the use of anesthetics.  Any blood disorders you have.  Previous surgeries you have had.  Medical conditions you have. RISKS AND COMPLICATIONS Generally, this is a safe procedure. However, problems can occur and include:  Infection.  Bleeding.  Tearing (perforation) of the esophagus, stomach, or duodenum.  Difficulty breathing or not being able to breathe.  Excessive sweating.  Spasms of the larynx.  Slowed heartbeat.  Low blood pressure. BEFORE THE PROCEDURE  Do not eat or drink anything after midnight on the night before the procedure or as directed by your health care provider.  Do not take your regular medicines before the procedure if your health care provider asks you not to. Ask your health  care provider about changing or stopping those medicines.  If you wear dentures, be prepared to remove them before the procedure.  Arrange for someone to drive you home after the procedure. PROCEDURE  A numbing medicine (local anesthetic) may be sprayed in your throat for comfort and to stop you from gagging or coughing.  You will have an IV tube inserted in a vein in your hand or arm. You will receive medicines and fluids through this tube.  You will be given a medicine to relax you (sedative).  A pain reliever will be given through the IV tube.  A mouth guard may be placed in your mouth to protect your teeth and to keep you from biting on the endoscope.  You will be asked to lie on your left side.  The endoscope will be inserted down your throat and into your esophagus, stomach, and duodenum.  Air will be put through the endoscope to allow your health care provider to clearly view the lining of your esophagus.  The lining of your esophagus, stomach, and duodenum will be examined. During the exam,  your health care provider may:  Remove tissue to be examined under a microscope (biopsy) for inflammation, infection, or other medical problems.  Remove growths.  Remove objects (foreign bodies) that are stuck.  Treat any bleeding with medicines or other devices that stop tissues from bleeding (hot cautery, clipping devices).  Widen (dilate) or stretch narrowed areas of your esophagus and stomach.  The endoscope will be withdrawn. AFTER THE PROCEDURE  You will be taken to a recovery area for observation. Your blood pressure, heart rate, breathing rate, and blood oxygen level will be monitored often until the medicines you were given have worn off.  Do not eat or drink anything until the numbing medicine has worn off and your gag reflex has returned. You may choke.  Your health care provider should be able to discuss his or her findings with you. It will take longer to discuss the  test results if any biopsies were taken.   This information is not intended to replace advice given to you by your health care provider. Make sure you discuss any questions you have with your health care provider.   Document Released: 01/28/2005 Document Revised: 10/18/2014 Document Reviewed: 08/30/2012 Elsevier Interactive Patient Education 2016 [employer] Esophageal Dilatation Esophageal dilatation is a procedure to open a blocked or narrowed part of the esophagus. The esophagus is the long tube in your throat that carries food and liquid from your mouth to your stomach. The procedure is also called esophageal dilation.  You may need this procedure if you have a buildup of scar tissue in your esophagus that makes it difficult, painful, or even impossible to swallow. This can be caused by gastroesophageal reflux disease (GERD). In rare cases, people need this procedure because they have cancer of the esophagus or a problem with the way food moves through the esophagus. Sometimes you may need to have another dilatation to enlarge the opening of the esophagus gradually. LET [facility] CARE PROVIDER KNOW ABOUT:   Any allergies you have.  All medicines you are taking, including vitamins, herbs, eye drops, creams, and over-the-counter medicines.  Previous problems you or members of your family have had with the use of anesthetics.  Any blood disorders you have.  Previous surgeries you have had.  Medical conditions you have.  Any antibiotic medicines you are required to take before dental procedures. RISKS AND COMPLICATIONS Generally, this is a safe procedure. However, problems can occur and include:  Bleeding from a tear in the lining of the esophagus.  A hole (perforation) in the esophagus. BEFORE THE PROCEDURE  Do not eat or drink anything after midnight on the night before the procedure or as directed by your health care provider.  Ask your health care provider about changing or  stopping your regular medicines. This is especially important if you are taking diabetes medicines or blood thinners.  Plan to have someone take you home after the procedure. PROCEDURE   You will be given a medicine that makes you relaxed and sleepy (sedative).  A medicine may be sprayed or gargled to numb the back of the throat.  Your health care provider can use various instruments to do an esophageal dilatation. During the procedure, the instrument used will be placed in your mouth and passed down into your esophagus. Options include:  Simple dilators. This instrument is carefully placed in the esophagus to stretch it.  Guided wire bougies. In this method, a flexible tube (endoscope) is used to insert a wire into the esophagus.  The dilator is passed over this wire to enlarge the esophagus. Then the wire is removed.  Balloon dilators. An endoscope with a small balloon at the end is passed down into the esophagus. Inflating the balloon gently stretches the esophagus and opens it up. AFTER THE PROCEDURE  Your blood pressure, heart rate, breathing rate, and blood oxygen level will be monitored often until the medicines you were given have worn off.  Your throat may feel slightly sore and will probably still feel numb. This will improve slowly over time.  You will not be allowed to eat or drink until the throat numbness has resolved.  If this is a same-day procedure, you may be allowed to go home once you have been able to drink, urinate, and sit on the edge of the bed without nausea or dizziness.  If this is a same-day procedure, you should have a friend or family member with you for the next 24 hours after the procedure.   This information is not intended to replace advice given to you by your health care provider. Make sure you discuss any questions you have with your health care provider.   Document Released: 11/18/2005 Document Revised: 10/18/2014 Document Reviewed:  02/06/2014 Elsevier Interactive Patient Education 2016 [employer] PATIENT INSTRUCTIONS POST-ANESTHESIA  IMMEDIATELY FOLLOWING SURGERY:  Do not drive or operate machinery for the first twenty four hours after surgery.  Do not make any important decisions for twenty four hours after surgery or while taking narcotic pain medications or sedatives.  If you develop intractable nausea and vomiting or a severe headache please notify your doctor immediately.  FOLLOW-UP:  Please make an appointment with your surgeon as instructed. You do not need to follow up with anesthesia unless specifically instructed to do so.  WOUND CARE INSTRUCTIONS (if applicable):  Keep a dry clean dressing on the anesthesia/puncture wound site if there is drainage.  Once the wound has quit draining you may leave it open to air.  Generally you should leave the bandage intact for twenty four hours unless there is drainage.  If the epidural site drains for more than 36-48 hours please call the anesthesia department.  QUESTIONS?:  Please feel free to call your physician or the hospital operator if you have any questions, and they will be happy to assist you.

## 2015-07-28 ENCOUNTER — Encounter (HOSPITAL_COMMUNITY)
Admission: RE | Admit: 2015-07-28 | Discharge: 2015-07-28 | Disposition: A | Discharge status: Home / Self Care | Payer: BLUE CROSS/BLUE SHIELD | Source: Ambulatory Visit | Attending: Internal Medicine | Admitting: Internal Medicine

## 2015-07-28 ENCOUNTER — Telehealth: Payer: Self-pay | Admitting: Internal Medicine

## 2015-07-28 NOTE — Telephone Encounter (Signed)
Nurse Selena Batten(Kim) called to let us know that patient was a no show for his pre op appointment this morning and she is able to reach him by phone or leave a voice mail.

## 2015-07-29 ENCOUNTER — Telehealth: Payer: Self-pay

## 2015-07-29 NOTE — Telephone Encounter (Signed)
Pt called office back and is aware of his pre-op appt on 08/01/2015 @ 1:15pm

## 2015-07-29 NOTE — Telephone Encounter (Signed)
Called pt twice on number in epic.  LMOM both times to call office back to reschedule his pre-op appointment

## 2015-07-31 NOTE — Patient Instructions (Signed)
John Wallace  07/31/2015     @PREFPERIOPPHARMACY @   Your procedure is scheduled on  08/07/2015   Report to [facility] at  1030  A.M.  Call this number if you have problems the morning of surgery:  [phone]   Remember:  Do not eat food or drink liquids after midnight.  Take these medicines the morning of surgery with A SIP OF WATER : hydrocodone, indocin, protonix.   Do not wear jewelry, make-up or nail polish.  Do not wear lotions, powders, or perfumes.  You may wear deodorant.  Do not shave 48 hours prior to surgery.  Men may shave face and neck.  Do not bring valuables to the hospital.  [facility] is not responsible for any belongings or valuables.  Contacts, dentures or bridgework may not be worn into surgery.  Leave your suitcase in the car.  After surgery it may be brought to your room.  For patients admitted to the hospital, discharge time will be determined by your treatment team.  Patients discharged the day of surgery will not be allowed to drive home.   Name and phone number of your driver:   family Special instructions:  Follow the diet and prep instructions given to you by Dr Luvenia Starch office.  Please read over the following fact sheets that you were given. Pain Booklet, Coughing and Deep Breathing, MRSA Information, Surgical Site Infection Prevention and Anesthesia Post-op Instructions      Esophagogastroduodenoscopy Esophagogastroduodenoscopy (EGD) is a procedure that is used to examine the lining of the esophagus, stomach, and first part of the small intestine (duodenum). A long, flexible, lighted tube with a camera attached (endoscope) is inserted down the throat to view these organs. This procedure is done to detect problems or abnormalities, such as inflammation, bleeding, ulcers, or growths, in order to treat them. The procedure lasts 5-20 minutes. It is usually an outpatient procedure, but it may need to be performed in a hospital in  emergency cases. LET [facility] CARE PROVIDER KNOW ABOUT:  Any allergies you have.  All medicines you are taking, including vitamins, herbs, eye drops, creams, and over-the-counter medicines.  Previous problems you or members of your family have had with the use of anesthetics.  Any blood disorders you have.  Previous surgeries you have had.  Medical conditions you have. RISKS AND COMPLICATIONS Generally, this is a safe procedure. However, problems can occur and include:  Infection.  Bleeding.  Tearing (perforation) of the esophagus, stomach, or duodenum.  Difficulty breathing or not being able to breathe.  Excessive sweating.  Spasms of the larynx.  Slowed heartbeat.  Low blood pressure. BEFORE THE PROCEDURE  Do not eat or drink anything after midnight on the night before the procedure or as directed by your health care provider.  Do not take your regular medicines before the procedure if your health care provider asks you not to. Ask your health care provider about changing or stopping those medicines.  If you wear dentures, be prepared to remove them before the procedure.  Arrange for someone to drive you home after the procedure. PROCEDURE  A numbing medicine (local anesthetic) may be sprayed in your throat for comfort and to stop you from gagging or coughing.  You will have an IV tube inserted in a vein in your hand or arm. You will receive medicines and fluids through this tube.  You will be given a medicine to relax you (sedative).  A pain reliever will be given through the IV tube.  A mouth guard may be placed in your mouth to protect your teeth and to keep you from biting on the endoscope.  You will be asked to lie on your left side.  The endoscope will be inserted down your throat and into your esophagus, stomach, and duodenum.  Air will be put through the endoscope to allow your health care provider to clearly view the lining of your  esophagus.  The lining of your esophagus, stomach, and duodenum will be examined. During the exam, your health care provider may:  Remove tissue to be examined under a microscope (biopsy) for inflammation, infection, or other medical problems.  Remove growths.  Remove objects (foreign bodies) that are stuck.  Treat any bleeding with medicines or other devices that stop tissues from bleeding (hot cautery, clipping devices).  Widen (dilate) or stretch narrowed areas of your esophagus and stomach.  The endoscope will be withdrawn. AFTER THE PROCEDURE  You will be taken to a recovery area for observation. Your blood pressure, heart rate, breathing rate, and blood oxygen level will be monitored often until the medicines you were given have worn off.  Do not eat or drink anything until the numbing medicine has worn off and your gag reflex has returned. You may choke.  Your health care provider should be able to discuss his or her findings with you. It will take longer to discuss the test results if any biopsies were taken.   This information is not intended to replace advice given to you by your health care provider. Make sure you discuss any questions you have with your health care provider.   Document Released: 01/28/2005 Document Revised: 10/18/2014 Document Reviewed: 08/30/2012 Elsevier Interactive Patient Education 2016 Elsevier Inc.  Esophageal Dilatation Esophageal dilatation is a procedure to open a blocked or narrowed part of the esophagus. The esophagus is the long tube in your throat that carries food and liquid from your mouth to your stomach. The procedure is also called esophageal dilation.  You may need this procedure if you have a buildup of scar tissue in your esophagus that makes it difficult, painful, or even impossible to swallow. This can be caused by gastroesophageal reflux disease (GERD). In rare cases, people need this procedure because they have cancer of the  esophagus or a problem with the way food moves through the esophagus. Sometimes you may need to have another dilatation to enlarge the opening of the esophagus gradually. LET [facility] CARE PROVIDER KNOW ABOUT:   Any allergies you have.  All medicines you are taking, including vitamins, herbs, eye drops, creams, and over-the-counter medicines.  Previous problems you or members of your family have had with the use of anesthetics.  Any blood disorders you have.  Previous surgeries you have had.  Medical conditions you have.  Any antibiotic medicines you are required to take before dental procedures. RISKS AND COMPLICATIONS Generally, this is a safe procedure. However, problems can occur and include:  Bleeding from a tear in the lining of the esophagus.  A hole (perforation) in the esophagus. BEFORE THE PROCEDURE  Do not eat or drink anything after midnight on the night before the procedure or as directed by your health care provider.  Ask your health care provider about changing or stopping your regular medicines. This is especially important if you are taking diabetes medicines or blood thinners.  Plan to have someone take you home after the procedure. PROCEDURE  You will be given a medicine that makes you relaxed and sleepy (sedative).  A medicine may be sprayed or gargled to numb the back of the throat.  Your health care provider can use various instruments to do an esophageal dilatation. During the procedure, the instrument used will be placed in your mouth and passed down into your esophagus. Options include:  Simple dilators. This instrument is carefully placed in the esophagus to stretch it.  Guided wire bougies. In this method, a flexible tube (endoscope) is used to insert a wire into the esophagus. The dilator is passed over this wire to enlarge the esophagus. Then the wire is removed.  Balloon dilators. An endoscope with a small balloon at the end is passed down  into the esophagus. Inflating the balloon gently stretches the esophagus and opens it up. AFTER THE PROCEDURE  Your blood pressure, heart rate, breathing rate, and blood oxygen level will be monitored often until the medicines you were given have worn off.  Your throat may feel slightly sore and will probably still feel numb. This will improve slowly over time.  You will not be allowed to eat or drink until the throat numbness has resolved.  If this is a same-day procedure, you may be allowed to go home once you have been able to drink, urinate, and sit on the edge of the bed without nausea or dizziness.  If this is a same-day procedure, you should have a friend or family member with you for the next 24 hours after the procedure.   This information is not intended to replace advice given to you by your health care provider. Make sure you discuss any questions you have with your health care provider.   Document Released: 11/18/2005 Document Revised: 10/18/2014 Document Reviewed: 02/06/2014 Elsevier Interactive Patient Education 2016 Elsevier    Colonoscopy A colonoscopy is an exam to look at the entire large intestine (colon). This exam can help find problems such as tumors, polyps, inflammation, and areas of bleeding. The exam takes about 1 hour.  [facility] PROVIDER KNOW ABOUT:   Any allergies you have.  All medicines you are taking, including vitamins, herbs, eye drops, creams, and over-the-counter medicines.  Previous problems you or members of your family have had with the use of anesthetics.  Any blood disorders you have.  Previous surgeries you have had.  Medical conditions you have. RISKS AND COMPLICATIONS  Generally, this is a safe procedure. However, as with any procedure, complications can occur. Possible complications include:  Bleeding.  Tearing or rupture of the colon wall.  Reaction to medicines given during the exam.  Infection (rare). BEFORE THE  PROCEDURE   Ask your health care provider about changing or stopping your regular medicines.  You may be prescribed an oral bowel prep. This involves drinking a large amount of medicated liquid, starting the day before your procedure. The liquid will cause you to have multiple loose stools until your stool is almost clear or light green. This cleans out your colon in preparation for the procedure.  Do not eat or drink anything else once you have started the bowel prep, unless your health care provider tells you it is safe to do so.  Arrange for someone to drive you home after the procedure. PROCEDURE   You will be given medicine to help you relax (sedative).  You will lie on your side with your knees bent.  A long, flexible tube with a light and camera on the  end (colonoscope) will be inserted through the rectum and into the colon. The camera sends video back to a computer screen as it moves through the colon. The colonoscope also releases carbon dioxide gas to inflate the colon. This helps your health care provider see the area better.  During the exam, your health care provider may take a small tissue sample (biopsy) to be examined under a microscope if any abnormalities are found.  The exam is finished when the entire colon has been viewed. AFTER THE PROCEDURE   Do not drive for 24 hours after the exam.  You may have a small amount of blood in your stool.  You may pass moderate amounts of gas and have mild abdominal cramping or bloating. This is caused by the gas used to inflate your colon during the exam.  Ask when your test results will be ready and how you will get your results. Make sure you get your test results.   This information is not intended to replace advice given to you by your health care provider. Make sure you discuss any questions you have with your health care provider.   Document Released: 09/24/2000 Document Revised: 07/18/2013 Document Reviewed:  06/04/2013 Elsevier Interactive Patient Education 2016 Elsevier Inc. PATIENT INSTRUCTIONS POST-ANESTHESIA  IMMEDIATELY FOLLOWING SURGERY:  Do not drive or operate machinery for the first twenty four hours after surgery.  Do not make any important decisions for twenty four hours after surgery or while taking narcotic pain medications or sedatives.  If you develop intractable nausea and vomiting or a severe headache please notify your doctor immediately.  FOLLOW-UP:  Please make an appointment with your surgeon as instructed. You do not need to follow up with anesthesia unless specifically instructed to do so.  WOUND CARE INSTRUCTIONS (if applicable):  Keep a dry clean dressing on the anesthesia/puncture wound site if there is drainage.  Once the wound has quit draining you may leave it open to air.  Generally you should leave the bandage intact for twenty four hours unless there is drainage.  If the epidural site drains for more than 36-48 hours please call the anesthesia department.  QUESTIONS?:  Please feel free to call your physician or the hospital operator if you have any questions, and they will be happy to assist you.

## 2015-08-01 ENCOUNTER — Telehealth: Payer: Self-pay | Admitting: Nurse Practitioner

## 2015-08-01 ENCOUNTER — Encounter (HOSPITAL_COMMUNITY)
Admission: RE | Admit: 2015-08-01 | Discharge: 2015-08-01 | Disposition: A | Discharge status: Home / Self Care | Payer: BLUE CROSS/BLUE SHIELD | Source: Ambulatory Visit | Attending: Internal Medicine | Admitting: Internal Medicine

## 2015-08-01 ENCOUNTER — Encounter (HOSPITAL_COMMUNITY): Payer: Self-pay

## 2015-08-01 DIAGNOSIS — Z01818 Encounter for other preprocedural examination: Secondary | ICD-10-CM | POA: Insufficient documentation

## 2015-08-01 DIAGNOSIS — K219 Gastro-esophageal reflux disease without esophagitis: Secondary | ICD-10-CM | POA: Diagnosis not present

## 2015-08-01 DIAGNOSIS — Z8371 Family history of colonic polyps: Secondary | ICD-10-CM | POA: Insufficient documentation

## 2015-08-01 LAB — BASIC METABOLIC PANEL WITH GFR
Anion gap: 7 (ref 5–15)
BUN: 20 mg/dL (ref 6–20)
CO2: 27 mmol/L (ref 22–32)
Calcium: 9.1 mg/dL (ref 8.9–10.3)
Chloride: 104 mmol/L (ref 101–111)
Creatinine, Ser: 0.97 mg/dL (ref 0.61–1.24)
GFR calc Af Amer: >60 mL/min
GFR calc non Af Amer: >60 mL/min
Glucose, Bld: 267 mg/dL — ABNORMAL HIGH (ref 65–99)
Potassium: 4.3 mmol/L (ref 3.5–5.1)
Sodium: 138 mmol/L (ref 135–145)

## 2015-08-01 LAB — CBC
HCT: 43.7 % (ref 39.0–52.0)
Hemoglobin: 14.5 g/dL (ref 13.0–17.0)
MCH: 29.5 pg (ref 26.0–34.0)
MCHC: 33.2 g/dL (ref 30.0–36.0)
MCV: 89 fL (ref 78.0–100.0)
Platelets: 210 10*3/uL (ref 150–400)
RBC: 4.91 MIL/uL (ref 4.22–5.81)
RDW: 13.1 % (ref 11.5–15.5)
WBC: 9.3 10*3/uL (ref 4.0–10.5)

## 2015-08-01 NOTE — Progress Notes (Signed)
Eric at Dr Rourk's office notified of Glu non fasting 267 . No orders given.

## 2015-08-01 NOTE — Telephone Encounter (Signed)
[facility] Patient preop (NON-FASTING) blood sugar 255. A month ago he was 155. Recently completed course of decadron. Please notify PCP so they can follow-up and query pre-diabetes and further evaluate. No history of DM noted in our system.

## 2015-08-04 NOTE — Telephone Encounter (Signed)
Sent labs to PCP.

## 2015-08-07 ENCOUNTER — Encounter (HOSPITAL_COMMUNITY)
Admission: RE | Disposition: A | Discharge status: Home / Self Care | Payer: Self-pay | Source: Ambulatory Visit | Attending: Internal Medicine

## 2015-08-07 ENCOUNTER — Ambulatory Visit (HOSPITAL_COMMUNITY): Payer: BLUE CROSS/BLUE SHIELD | Admitting: Anesthesiology

## 2015-08-07 ENCOUNTER — Encounter (HOSPITAL_COMMUNITY): Payer: Self-pay | Admitting: *Deleted

## 2015-08-07 ENCOUNTER — Ambulatory Visit (HOSPITAL_COMMUNITY)
Admission: RE | Admit: 2015-08-07 | Discharge: 2015-08-07 | Disposition: A | Discharge status: Home / Self Care | Payer: BLUE CROSS/BLUE SHIELD | Source: Ambulatory Visit | Attending: Internal Medicine | Admitting: Internal Medicine

## 2015-08-07 DIAGNOSIS — N189 Chronic kidney disease, unspecified: Secondary | ICD-10-CM | POA: Insufficient documentation

## 2015-08-07 DIAGNOSIS — K219 Gastro-esophageal reflux disease without esophagitis: Secondary | ICD-10-CM | POA: Insufficient documentation

## 2015-08-07 DIAGNOSIS — Z8371 Family history of colonic polyps: Secondary | ICD-10-CM | POA: Insufficient documentation

## 2015-08-07 DIAGNOSIS — K760 Fatty (change of) liver, not elsewhere classified: Secondary | ICD-10-CM | POA: Diagnosis not present

## 2015-08-07 DIAGNOSIS — Z8719 Personal history of other diseases of the digestive system: Secondary | ICD-10-CM | POA: Diagnosis not present

## 2015-08-07 DIAGNOSIS — K639 Disease of intestine, unspecified: Secondary | ICD-10-CM | POA: Diagnosis not present

## 2015-08-07 DIAGNOSIS — M109 Gout, unspecified: Secondary | ICD-10-CM | POA: Diagnosis not present

## 2015-08-07 DIAGNOSIS — Z7952 Long term (current) use of systemic steroids: Secondary | ICD-10-CM | POA: Diagnosis not present

## 2015-08-07 DIAGNOSIS — Z980 Intestinal bypass and anastomosis status: Secondary | ICD-10-CM | POA: Diagnosis not present

## 2015-08-07 DIAGNOSIS — I2510 Atherosclerotic heart disease of native coronary artery without angina pectoris: Secondary | ICD-10-CM | POA: Diagnosis not present

## 2015-08-07 DIAGNOSIS — Z9049 Acquired absence of other specified parts of digestive tract: Secondary | ICD-10-CM | POA: Diagnosis not present

## 2015-08-07 DIAGNOSIS — Z6839 Body mass index (BMI) 39.0-39.9, adult: Secondary | ICD-10-CM | POA: Insufficient documentation

## 2015-08-07 DIAGNOSIS — Z1211 Encounter for screening for malignant neoplasm of colon: Secondary | ICD-10-CM | POA: Insufficient documentation

## 2015-08-07 DIAGNOSIS — Z79899 Other long term (current) drug therapy: Secondary | ICD-10-CM | POA: Diagnosis not present

## 2015-08-07 DIAGNOSIS — E669 Obesity, unspecified: Secondary | ICD-10-CM | POA: Insufficient documentation

## 2015-08-07 DIAGNOSIS — K319 Disease of stomach and duodenum, unspecified: Secondary | ICD-10-CM | POA: Insufficient documentation

## 2015-08-07 DIAGNOSIS — Z79891 Long term (current) use of opiate analgesic: Secondary | ICD-10-CM | POA: Insufficient documentation

## 2015-08-07 DIAGNOSIS — R569 Unspecified convulsions: Secondary | ICD-10-CM | POA: Diagnosis not present

## 2015-08-07 DIAGNOSIS — K3189 Other diseases of stomach and duodenum: Secondary | ICD-10-CM | POA: Diagnosis not present

## 2015-08-07 DIAGNOSIS — I252 Old myocardial infarction: Secondary | ICD-10-CM | POA: Insufficient documentation

## 2015-08-07 DIAGNOSIS — K2970 Gastritis, unspecified, without bleeding: Secondary | ICD-10-CM | POA: Diagnosis not present

## 2015-08-07 DIAGNOSIS — R1310 Dysphagia, unspecified: Secondary | ICD-10-CM | POA: Insufficient documentation

## 2015-08-07 DIAGNOSIS — K449 Diaphragmatic hernia without obstruction or gangrene: Secondary | ICD-10-CM | POA: Insufficient documentation

## 2015-08-07 DIAGNOSIS — R1013 Epigastric pain: Secondary | ICD-10-CM | POA: Insufficient documentation

## 2015-08-07 DIAGNOSIS — K635 Polyp of colon: Secondary | ICD-10-CM | POA: Diagnosis not present

## 2015-08-07 DIAGNOSIS — R0789 Other chest pain: Secondary | ICD-10-CM | POA: Diagnosis present

## 2015-08-07 HISTORY — PX: COLONOSCOPY WITH PROPOFOL: SHX5780

## 2015-08-07 HISTORY — PX: ESOPHAGEAL DILATION: SHX303

## 2015-08-07 HISTORY — PX: ESOPHAGOGASTRODUODENOSCOPY (EGD) WITH PROPOFOL: SHX5813

## 2015-08-07 HISTORY — PX: BIOPSY: SHX5522

## 2015-08-07 LAB — GLUCOSE, CAPILLARY: GLUCOSE-CAPILLARY: 112 mg/dL — AB (ref 65–99)

## 2015-08-07 SURGERY — COLONOSCOPY WITH PROPOFOL
Anesthesia: Monitor Anesthesia Care

## 2015-08-07 MED ORDER — ONDANSETRON HCL 4 MG/2ML IJ SOLN
4.0000 mg | Freq: Once | INTRAMUSCULAR | Status: AC
  Administered 2015-08-07: 4 mg via INTRAVENOUS

## 2015-08-07 MED ORDER — PROPOFOL 10 MG/ML IV BOLUS
INTRAVENOUS | Status: AC
  Filled 2015-08-07: qty 20

## 2015-08-07 MED ORDER — FENTANYL CITRATE (PF) 100 MCG/2ML IJ SOLN
25.0000 ug | INTRAMUSCULAR | Status: AC
  Administered 2015-08-07: 25 ug via INTRAVENOUS

## 2015-08-07 MED ORDER — FENTANYL CITRATE (PF) 100 MCG/2ML IJ SOLN
INTRAMUSCULAR | Status: DC | PRN
  Administered 2015-08-07 (×4): 25 ug via INTRAVENOUS

## 2015-08-07 MED ORDER — ONDANSETRON HCL 4 MG/2ML IJ SOLN
INTRAMUSCULAR | Status: AC
  Filled 2015-08-07: qty 2

## 2015-08-07 MED ORDER — MIDAZOLAM HCL 2 MG/2ML IJ SOLN
INTRAMUSCULAR | Status: AC
  Filled 2015-08-07: qty 4

## 2015-08-07 MED ORDER — PROPOFOL 500 MG/50ML IV EMUL
INTRAVENOUS | Status: DC | PRN
  Administered 2015-08-07: 12:00:00 via INTRAVENOUS
  Administered 2015-08-07: 125 ug/kg/min via INTRAVENOUS

## 2015-08-07 MED ORDER — ONDANSETRON HCL 4 MG/2ML IJ SOLN: 4.0000 mg | Freq: Once | INTRAMUSCULAR | Status: DC | PRN

## 2015-08-07 MED ORDER — LACTATED RINGERS IV SOLN
INTRAVENOUS | Status: DC
  Administered 2015-08-07: 1000 mL via INTRAVENOUS
  Administered 2015-08-07: 11:00:00 via INTRAVENOUS

## 2015-08-07 MED ORDER — STERILE WATER FOR IRRIGATION IR SOLN
Status: DC | PRN
  Administered 2015-08-07: 12:00:00

## 2015-08-07 MED ORDER — LIDOCAINE HCL (PF) 1 % IJ SOLN
INTRAMUSCULAR | Status: AC
  Filled 2015-08-07: qty 5

## 2015-08-07 MED ORDER — WATER FOR IRRIGATION, STERILE IR SOLN
Status: DC | PRN
  Administered 2015-08-07: 1000 mL

## 2015-08-07 MED ORDER — GLYCOPYRROLATE 0.2 MG/ML IJ SOLN
0.2000 mg | Freq: Once | INTRAMUSCULAR | Status: AC
  Administered 2015-08-07: 0.2 mg via INTRAVENOUS

## 2015-08-07 MED ORDER — MIDAZOLAM HCL 2 MG/2ML IJ SOLN
INTRAMUSCULAR | Status: AC
  Filled 2015-08-07: qty 2

## 2015-08-07 MED ORDER — GLYCOPYRROLATE 0.2 MG/ML IJ SOLN
INTRAMUSCULAR | Status: AC
  Filled 2015-08-07: qty 1

## 2015-08-07 MED ORDER — MIDAZOLAM HCL 2 MG/2ML IJ SOLN
1.0000 mg | INTRAMUSCULAR | Status: DC | PRN
  Administered 2015-08-07: 2 mg via INTRAVENOUS

## 2015-08-07 MED ORDER — LIDOCAINE VISCOUS 2 % MT SOLN
OROMUCOSAL | Status: AC
  Filled 2015-08-07: qty 15

## 2015-08-07 MED ORDER — LIDOCAINE VISCOUS 2 % MT SOLN
15.0000 mL | Freq: Once | OROMUCOSAL | Status: AC
  Administered 2015-08-07: 15 mL via OROMUCOSAL

## 2015-08-07 MED ORDER — MIDAZOLAM HCL 5 MG/5ML IJ SOLN
INTRAMUSCULAR | Status: DC | PRN
  Administered 2015-08-07: 1 mg via INTRAVENOUS

## 2015-08-07 MED ORDER — FENTANYL CITRATE (PF) 100 MCG/2ML IJ SOLN
INTRAMUSCULAR | Status: AC
  Filled 2015-08-07: qty 2

## 2015-08-07 MED ORDER — LIDOCAINE HCL (CARDIAC) 10 MG/ML IV SOLN
INTRAVENOUS | Status: DC | PRN
  Administered 2015-08-07: 50 mg via INTRAVENOUS

## 2015-08-07 MED ORDER — FENTANYL CITRATE (PF) 100 MCG/2ML IJ SOLN: 25.0000 ug | INTRAMUSCULAR | Status: DC | PRN

## 2015-08-07 SURGICAL SUPPLY — 33 items
BALLN CRE LF 10-12 240X5.5 (BALLOONS)
BALLN CRE LF 10-12MM 240X5.5 (BALLOONS)
BALLN DILATOR CRE 12-15 240 (BALLOONS)
BALLN DILATOR CRE 15-18 240 (BALLOONS) IMPLANT
BALLN DILATOR CRE 18-20 240 (BALLOONS) IMPLANT
BALLN DILATOR CRE WIREGUIDE (BALLOONS)
BALLOON CRE LF 10-12 240X5.5 (BALLOONS) IMPLANT
BALLOON DILATOR CRE 12-15 240 (BALLOONS) IMPLANT
BALLOON DILATOR CRE WIREGUIDE (BALLOONS) IMPLANT
BLOCK BITE 60FR ADLT L/F BLUE (MISCELLANEOUS) ×2 IMPLANT
DEVICE CLIP HEMOSTAT 235CM (CLIP) IMPLANT
ELECT REM PT RETURN 9FT ADLT (ELECTROSURGICAL)
ELECTRODE REM PT RTRN 9FT ADLT (ELECTROSURGICAL) IMPLANT
FCP BXJMBJMB 240X2.8X (CUTTING FORCEPS)
FORCEPS BIOP RAD 4 LRG CAP 4 (CUTTING FORCEPS) ×2 IMPLANT
FORCEPS BIOP RJ4 240 W/NDL (CUTTING FORCEPS)
FORCEPS BXJMBJMB 240X2.8X (CUTTING FORCEPS) IMPLANT
FORMALIN 10 PREFIL 20ML (MISCELLANEOUS) ×2 IMPLANT
INJECTOR/SNARE I SNARE (MISCELLANEOUS) IMPLANT
KIT ENDO PROCEDURE PEN (KITS) ×3 IMPLANT
MANIFOLD NEPTUNE II (INSTRUMENTS) ×2 IMPLANT
NDL SCLEROTHERAPY 25GX240 (NEEDLE) IMPLANT
NEEDLE SCLEROTHERAPY 25GX240 (NEEDLE) IMPLANT
PROBE APC STR FIRE (PROBE) IMPLANT
PROBE INJECTION GOLD (MISCELLANEOUS)
PROBE INJECTION GOLD 7FR (MISCELLANEOUS) IMPLANT
SNARE ROTATE MED OVAL 20MM (MISCELLANEOUS) IMPLANT
SNARE SHORT THROW 13M SML OVAL (MISCELLANEOUS) ×1 IMPLANT
SYR INFLATE BILIARY GAUGE (MISCELLANEOUS) IMPLANT
SYR INFLATION 60ML (SYRINGE) ×3 IMPLANT
TRAP SPECIMEN MUCOUS 40CC (MISCELLANEOUS) IMPLANT
TUBING IRRIGATION ENDOGATOR (MISCELLANEOUS) ×2 IMPLANT
WATER STERILE IRR 1000ML POUR (IV SOLUTION) ×2 IMPLANT

## 2015-08-07 NOTE — Op Note (Signed)
[facility] [address] [place], [id number]   COLONOSCOPY PROCEDURE REPORT  PATIENT: John Wallace, John Wallace  MR#: [id number] BIRTHDATE: 1961-04-29 , 54  yrs. old GENDER: male ENDOSCOPIST: Wallace.  Roetta SessionsMichael Alfredo Collymore, MD FACP [facility] REFERRED AO:ZHYQBY:John Phillips OdorGolding, M.D. PROCEDURE DATE:  08/07/2015 PROCEDURE:   Colonoscopy with biopsy INDICATIONS:Positive family history of colon polyps. MEDICATIONS: Deep sedation per Dr[facility]  [facility] ASA CLASS:       Class III  CONSENT: The risks, benefits, alternatives and imponderables including but not limited to bleeding, perforation as well as the possibility of a missed lesion have been reviewed.  The potential for biopsy, lesion removal, etc. have also been discussed. Questions have been answered.  All parties agreeable.  Please see the history and physical in the medical record for more information.  DESCRIPTION OF PROCEDURE:   After the risks benefits and alternatives of the procedure were thoroughly explained, informed consent was obtained.  The digital rectal exam revealed no abnormalities of the rectum.   The     endoscope was introduced through the anus and advanced to the cecum, which was identified by both the appendix and ileocecal valve. No adverse events experienced.   The quality of the prep was adequate  The instrument was then slowly withdrawn as the colon was fully examined. Estimated blood loss is zero unless otherwise noted in this procedure report.      COLON FINDINGS: Normal-appearing rectal mucosa.  Surgical anastomosis identified at 25 cm.  The patient had an area focal erosion on top of minimally appearing polypoid mucosa in the mid transverse segment; otherwise, the remainder of the colonic mucosa appeared normal.  The abnormal mucosa noted above was biopsied for histologic study. .  Withdrawal time=9 minutes 0 seconds.  The scope was withdrawn and the procedure completed. COMPLICATIONS: There were no  immediate complications. EBL 3 mL ENDOSCOPIC IMPRESSION: Status post segmental colonic resection. Focally abnormal appearing colonic mucosa of doubtful clinical significance?"status post biopsy  RECOMMENDATIONS: Follow-up I'll path biology. See EGD report.  eSigned:  R. Roetta SessionsMichael Vegas Fritze, MD Jerrel IvoryFACP [facility] 08/07/2015 12:22 PM   cc:  CPT CODES: ICD CODES:  The ICD and CPT codes recommended by this software are interpretations from the data that the clinical staff has captured with the software.  The verification of the translation of this report to the ICD and CPT codes and modifiers is the sole responsibility of the health care institution and practicing physician where this report was generated.  [employer] will not be held responsible for the validity of the ICD and CPT codes included on this report.  AMA assumes no liability for data contained or not contained herein. CPT is a Publishing rights managerregistered trademark of the [employer].

## 2015-08-07 NOTE — H&P (View-Only) (Signed)
Primary Care Physician:  Colette Ribas, MD  Primary Gastroenterologist:  Roetta Sessions, MD   Chief Complaint  Patient presents with  . Gastrophageal Reflux    pt denies  . Abdominal Pain    HPI:  John Wallace is a 54 y.o. male here for further evaluation of atypical chest pain, abdominal pain. Three ER visits since 04/2015. CTA chest and CT A/P as outlined below. Unremarkable except for marked hepatic steatosis. Has since seen cardiology at [facility] in [place]. Exercise stress test unremarkable.   He wonders if symptoms partially related to depression. Had been on vacation visiting grandkids and didn't want to leave. Three "attacks" within first week back. Felt like chest pressure and SOB. Seen in ER and by PCP. PCP started PPI. No further "attacks" but still having abdominal pain. Had h/o nocturnal regurgitation/heartburn. Wake up choking. Eats late and lays down. Symptoms improved on PPI. Still having postprandial epigastric pain wraps around to back, associated with upper abdominal swelling. No further vomiting. Used to have vomiting with meals. Chronically feels like he washes down food but doesn't know if that's abnormal. No worse than usual. BM regular. No melena, brbpr.   Takes Vicodin, decadron, indomethacin prn gout. H/o etoh use, intermittent, with occasionally binging in past but none in three year.    Drinking a lot coke. Used to drink a case a day, down to twelve pack, and now maybe 1 liter. Feels better with exercising. Walking regularly for about a month but gained 20 pounds and got frustrated.    Son had colonoscopy complicated by colonic perforation, removal of multiple colon polyps.   Current Outpatient Prescriptions  Medication Sig Dispense Refill  . dexamethasone (DECADRON) 4 MG tablet Take 1 tablet (4 mg total) by mouth 2 (two) times daily with a meal. 12 tablet 0  . HYDROcodone-acetaminophen (NORCO/VICODIN) 5-325 MG tablet Take 1-2 tablets by mouth every 4 (four)  hours as needed. 20 tablet 0  . indomethacin (INDOCIN) 25 MG capsule Take 1 capsule (25 mg total) by mouth 3 (three) times daily as needed. 30 capsule 0  . pantoprazole (PROTONIX) 40 MG tablet Take 40 mg by mouth daily.    . simethicone (GAS-X) 80 MG chewable tablet Chew 80 mg by mouth every 6 (six) hours as needed for flatulence.     No current facility-administered medications for this visit.   Facility-Administered Medications Ordered in Other Visits  Medication Dose Route Frequency Provider Last Rate Last Dose  . gentamicin (GARAMYCIN) 440 mg in dextrose 5 % 100 mL IVPB  440 mg Intravenous 30 min Pre-Op Sebastian Ache, MD        Allergies as of 07/22/2015  . (No Known Allergies)    Past Medical History  Diagnosis Date  . Gout   . Obesity   . Diverticulosis   . Myocardial infarction (HCC)     mild Heart attack 2002  . Chronic kidney disease   . Renal stone     passed   . Seizures (HCC)     as a child  . GERD (gastroesophageal reflux disease)     no medical treatment  . Coronary artery disease     Past Surgical History  Procedure Laterality Date  . Partial colectomy      Dr Bradford:diverticulosis  . Cardiac catheterization    . Throat biop      benign  . Appendectomy    . Back surgery      x5 surgeries  . Colonoscopy  2003    RMR: Normal appearing colonic mucosa to 35-40 cm. Spasm versus persistent swelling and noncompliance of the colon along with patient's discomfort precluded completion of exam.    Family History  Problem Relation Age of Onset  . Other      doesn't know family history    Social History   Social History  . Marital Status: Married    Spouse Name: N/A  . Number of Children: 5  . Years of Education: N/A   Occupational History  . [employer]    Social History Main Topics  . Smoking status: Never Smoker   . Smokeless tobacco: Former Neurosurgeon    Types: Chew  . Alcohol Use: No     Comment: none since 2013, not regular drinker before  but would occasional binging  . Drug Use: No  . Sexual Activity: Not on file   Other Topics Concern  . Not on file   Social History Narrative      ROS:  General: Negative for anorexia, weight loss, fever, chills, fatigue, weakness. Eyes: Negative for vision changes.  ENT: Negative for hoarseness, difficulty swallowing , nasal congestion. CV: Negative for chest pain, angina, palpitations, dyspnea on exertion, peripheral edema.  Respiratory: Negative for dyspnea at rest, dyspnea on exertion, cough, sputum, wheezing.  GI: See history of present illness. GU:  Negative for dysuria, hematuria, urinary incontinence, urinary frequency, nocturnal urination.  MS: Negative for joint pain, low back pain.  Derm: Negative for rash or itching.  Neuro: Negative for weakness, abnormal sensation, seizure, frequent headaches, memory loss, confusion.  Psych: H/O depression. No suicidal ideation, hallucinations.  Endo: Negative for unusual weight change.  Heme: Negative for bruising or bleeding. Allergy: Negative for rash or hives.    Physical Examination:  BP 160/100 mmHg  Pulse 52  Temp(Src) 97.4 F (36.3 C)  Ht  (1.727 m)  Wt 255 lb 6.4 oz (115.849 kg)  BMI 38.84 kg/m2   General: Well-nourished, well-developed in no acute distress.  Head: Normocephalic, atraumatic.   Eyes: Conjunctiva pink, no icterus. Mouth: Oropharyngeal mucosa moist and pink , no lesions erythema or exudate. Neck: Supple without thyromegaly, masses, or lymphadenopathy.  Lungs: Clear to auscultation bilaterally.  Heart: Regular rate and rhythm, no murmurs rubs or gallops.  Abdomen: Bowel sounds are normal, mild epigastric tenderness, nondistended, no hepatosplenomegaly or masses, no abdominal bruits or    hernia , no rebound or guarding.   Rectal: not performed Extremities: No lower extremity edema. No clubbing or deformities.  Neuro: Alert and oriented x 4 , grossly normal neurologically.  Skin: Warm and dry,  no rash or jaundice.   Psych: Alert and cooperative, normal mood and affect.  Labs: Lab Results  Component Value Date   WBC 7.4 06/19/2015   HGB 14.9 06/19/2015   HCT 45.9 06/19/2015   MCV 88.3 06/19/2015   PLT 235 06/19/2015   Lab Results  Component Value Date   CREATININE 0.85 06/19/2015   BUN 10 06/19/2015   NA 139 06/19/2015   K 3.8 06/19/2015   CL 104 06/19/2015   CO2 27 06/19/2015   Lab Results  Component Value Date   ALT 46 06/19/2015   AST 38 06/19/2015   ALKPHOS 71 06/19/2015   BILITOT 0.7 06/19/2015   Lab Results  Component Value Date   LIPASE 24 06/19/2015     Imaging Studies:   CLINICAL DATA: Nausea, vomiting, upper abdominal pain.  EXAM: CT ABDOMEN AND PELVIS WITH CONTRAST  TECHNIQUE:  Multidetector CT imaging of the abdomen and pelvis was performed using the standard protocol following bolus administration of intravenous contrast.  CONTRAST: 100mL OMNIPAQUE IOHEXOL 300 MG/ML SOLN  COMPARISON: None.  FINDINGS: Lower chest: Unremarkable.  Hepatobiliary: No masses or other significant abnormality identified. There is hepatic steatosis.  Pancreas: No evidence of mass, inflammatory changes, or other significant abnormality.  Spleen: Within normal limits in size and appearance.  Adrenal Glands: No masses identified.  Kidneys/Urinary Tract: No evidence of urolithiasis or hydronephrosis. No solid or complex cystic renal masses identified. No masses or calculi seen involving the lower urinary tract.  Stomach/Bowel/Peritoneum: No evidence of wall thickening, mass, or obstruction. Enterotomy lines are seen along the sigmoid colon.  Vascular/Lymphatic: No pathologically enlarged lymph nodes identified. No other significant abnormality noted.  Reproductive: No masses or other significant abnormality identified. Prostate gland calcifications are seen.  Other: None.  Musculoskeletal: There is a 6 mm sclerotic lesion  within the right ilium. Osteoarthritic changes of the lower lumbosacral spine are seen. Findings of diffuse idiopathic skeletal hyperostosis of the lower thoracic spine also seen.  IMPRESSION: Severe hepatic steatosis.  Otherwise no evidence of abnormality within the solid abdominal organs.  Prior left colonic resection.  Prostate gland calcifications, nonspecific finding. Please correlate clinically.   Electronically Signed  By: Ted Mcalpineobrinka Dimitrova M.D.  On: 06/19/2015 12:54  CLINICAL DATA: Acute onset shortness of breath. Recent air travel.  EXAM: CT ANGIOGRAPHY CHEST WITH CONTRAST  TECHNIQUE: Multidetector CT imaging of the chest was performed using the standard protocol during bolus administration of intravenous contrast. Multiplanar CT image reconstructions and MIPs were obtained to evaluate the vascular anatomy.  CONTRAST: 100mL OMNIPAQUE IOHEXOL 350 MG/ML SOLN  COMPARISON: None.  FINDINGS: THORACIC INLET/BODY WALL:  No acute abnormality.  MEDIASTINUM:  Normal heart size. No pericardial effusion. No acute vascular abnormality, including evidence of pulmonary embolism. No aortic dissection or aneurysm. No adenopathy.  LUNG WINDOWS:  No consolidation. No effusion. No suspicious pulmonary nodule.  UPPER ABDOMEN:  Marked hepatic steatosis.  OSSEOUS:  No acute fracture. No suspicious lytic or blastic lesions.  Review of the MIP images confirms the above findings.  IMPRESSION: 1. No evidence of pulmonary embolism or other acute disease. 2. Marked hepatic steatosis.   Electronically Signed  By: Marnee SpringJonathon Watts M.D.  On: 05/28/2015 05:00

## 2015-08-07 NOTE — Interval H&P Note (Signed)
History and Physical Interval Note:  08/07/2015 11:14 AM  John Wallace  has presented today for surgery, with the diagnosis of GERD/SCREENING/FAMILY HISTORY OF POLYPS  The various methods of treatment have been discussed with the patient and family. After consideration of risks, benefits and other options for treatment, the patient has consented to  Procedure(s) with comments: COLONOSCOPY WITH PROPOFOL (N/A) - 1130 ESOPHAGOGASTRODUODENOSCOPY (EGD) WITH PROPOFOL (N/A) ESOPHAGEAL DILATION (N/A) as a surgical intervention .  The patient's history has been reviewed, patient examined, no change in status, stable for surgery.  I have reviewed the patient's chart and labs.  Questions were answered to the patient's satisfaction.     John Wallace  No change. EGD with esophageal dilation as feasible/appropriate and the colonoscopy per plan.  The risks, benefits, limitations, imponderables and alternatives regarding both EGD and colonoscopy have been reviewed with the patient. Questions have been answered. All parties agreeable.

## 2015-08-07 NOTE — Transfer of Care (Signed)
Immediate Anesthesia Transfer of Care Note  Patient: Donnald GarreRobert R Hilyard  Procedure(s) Performed: Procedure(s) with comments: COLONOSCOPY WITH PROPOFOL (N/A) - 1159 in cecum, total withdrawal time is 9min ESOPHAGOGASTRODUODENOSCOPY (EGD) WITH PROPOFOL (N/A) ESOPHAGEAL DILATION (N/A) - #56 maloney dilator Gastric Biopsies (N/A)  Patient Location: PACU  Anesthesia Type:MAC  Level of Consciousness: awake, alert , oriented and patient cooperative  Airway & Oxygen Therapy: Patient Spontanous Breathing and Patient connected to nasal cannula oxygen  Post-op Assessment: Report given to RN and Post -op Vital signs reviewed and stable  Post vital signs: Reviewed and stable  Last Vitals:  Filed Vitals:   08/07/15 1215  BP: 118/91  Pulse: 95  Temp:   Resp: 14    Complications: No apparent anesthesia complications

## 2015-08-07 NOTE — Op Note (Signed)
[facility] [address] [place], [id number]   ENDOSCOPY PROCEDURE REPORT  PATIENT: John Wallace, John Wallace  MR#: [id number] BIRTHDATE: November 12, 1960 , 54  yrs. old GENDER: male ENDOSCOPIST: Wallace.  Roetta SessionsMichael Harpreet Signore, MD FACP FACG REFERRED BY:  Assunta FoundJohn Golding, M.D. PROCEDURE DATE:  08/07/2015 PROCEDURE:  EGD, diagnostic, w/ biopsy  and Maloney dilation INDICATIONS:  Esophageal dysphagia; chest pain not felt to be cardiac in etiology. MEDICATIONS: Deep sedation per Dr. [facility] ASA CLASS:      Class III  CONSENT: The risks, benefits, limitations, alternatives and imponderables have been discussed.  The potential for biopsy, esophogeal dilation, etc. have also been reviewed.  Questions have been answered.  All parties agreeable.  Please see the history and physical in the medical record for more information.  DESCRIPTION OF PROCEDURE: After the risks benefits and alternatives of the procedure were thoroughly explained, informed consent was obtained.  The    endoscope was introduced through the mouth and advanced to the second portion of the duodenum , limited by Without limitations. The instrument was slowly withdrawn as the mucosa was fully examined. Estimated blood loss is zero unless otherwise noted in this procedure report.    Normal-appearing, patent tubular esophagus.  Stomach empty.  2 cm hiatal hernia.  Multiple antral erosions.  No ulcer or infiltrating process.  Patent pylorus.  Normal-appearing first and second portion of the duodenum.  The scope was removed and a 56 [place] Maloney dilator was passed to full insertion easily.  A look back revealed no apparent complication related to this maneuver.  Subsequently, biopsies of the abnormal antrum taken for histologic study.  Retroflexed views revealed a hiatal hernia.     The scope was then withdrawn from the patient and the procedure completed.  COMPLICATIONS: There were no immediate complications. EBL  3 mL ENDOSCOPIC IMPRESSION: Normal esophagus  -  status post Maloney dilation. Hiatal hernia. Abnormal stomach as described?"status post biopsy.  RECOMMENDATIONS: Resume Protonix 40 mg daily. Follow up on pathology. See colonoscopy report  REPEAT EXAM:  eSigned:  R. Roetta SessionsMichael Hayli Milligan, MD Jerrel IvoryFACP [facility] HospitalFACG 08/07/2015 12:19 PM    CC:  CPT CODES: ICD CODES:  The ICD and CPT codes recommended by this software are interpretations from the data that the clinical staff has captured with the software.  The verification of the translation of this report to the ICD and CPT codes and modifiers is the sole responsibility of the health care institution and practicing physician where this report was generated.  [employer] will not be held responsible for the validity of the ICD and CPT codes included on this report.  AMA assumes no liability for data contained or not contained herein. CPT is a Publishing rights managerregistered trademark of the [employer].  PATIENT NAME:  John Wallace, John Wallace MR#: [id number]

## 2015-08-07 NOTE — Anesthesia Postprocedure Evaluation (Signed)
  Anesthesia Post-op Note  Patient: Donnald GarreRobert R Palazzola  Procedure(s) Performed: Procedure(s) with comments: COLONOSCOPY WITH PROPOFOL (N/A) - 1159 in cecum, total withdrawal time is 9min ESOPHAGOGASTRODUODENOSCOPY (EGD) WITH PROPOFOL (N/A) ESOPHAGEAL DILATION (N/A) - #56 maloney dilator Gastric Biopsies (N/A)  Patient Location: PACU  Anesthesia Type:MAC  Level of Consciousness: awake, alert , oriented and patient cooperative  Airway and Oxygen Therapy: Patient Spontanous Breathing and Patient connected to nasal cannula oxygen  Post-op Pain: none  Post-op Assessment: Post-op Vital signs reviewed, Patient's Cardiovascular Status Stable, Respiratory Function Stable, Patent Airway and No signs of Nausea or vomiting              Post-op Vital Signs: Reviewed and stable  Last Vitals:  Filed Vitals:   08/07/15 1215  BP: 118/91  Pulse: 95  Temp:   Resp: 14    Complications: No apparent anesthesia complications

## 2015-08-07 NOTE — Anesthesia Preprocedure Evaluation (Signed)
Anesthesia Evaluation  Patient identified by MRN, date of birth, ID band Patient awake    Reviewed: Allergy & Precautions, NPO status , Patient's Chart, lab work & pertinent test results  Airway Mallampati: II  TM Distance: >3 FB     Dental  (+) Teeth Intact   Pulmonary neg pulmonary ROS,    breath sounds clear to auscultation       Cardiovascular + CAD and + Past MI   Rhythm:Regular     Neuro/Psych Seizures -,     GI/Hepatic   Endo/Other    Renal/GU Renal disease     Musculoskeletal   Abdominal   Peds  Hematology   Anesthesia Other Findings   Reproductive/Obstetrics                             Anesthesia Physical Anesthesia Plan  ASA: III  Anesthesia Plan: MAC   Post-op Pain Management:    Induction: Intravenous  Airway Management Planned: Simple Face Mask  Additional Equipment:   Intra-op Plan:   Post-operative Plan:   Informed Consent: I have reviewed the patients History and Physical, chart, labs and discussed the procedure including the risks, benefits and alternatives for the proposed anesthesia with the patient or authorized representative who has indicated his/her understanding and acceptance.     Plan Discussed with:   Anesthesia Plan Comments:         Anesthesia Quick Evaluation

## 2015-08-07 NOTE — Discharge Instructions (Signed)
Colonoscopy Discharge Instructions  Read the instructions outlined below and refer to this sheet in the next few weeks. These discharge instructions provide you with general information on caring for yourself after you leave the hospital. Your doctor may also give you specific instructions. While your treatment has been planned according to the most current medical practices available, unavoidable complications occasionally occur. If you have any problems or questions after discharge, call Dr. Gala Romney at [phone]. ACTIVITY  You may resume your regular activity, but move at a slower pace for the next 24 hours.   Take frequent rest periods for the next 24 hours.   Walking will help get rid of the air and reduce the bloated feeling in your belly (abdomen).   No driving for 24 hours (because of the medicine (anesthesia) used during the test).    Do not sign any important legal documents or operate any machinery for 24 hours (because of the anesthesia used during the test).  NUTRITION  Drink plenty of fluids.   You may resume your normal diet as instructed by your doctor.   Begin with a light meal and progress to your normal diet. Heavy or fried foods are harder to digest and may make you feel sick to your stomach (nauseated).   Avoid alcoholic beverages for 24 hours or as instructed.  MEDICATIONS  You may resume your normal medications unless your doctor tells you otherwise.  WHAT YOU CAN EXPECT TODAY  Some feelings of bloating in the abdomen.   Passage of more gas than usual.   Spotting of blood in your stool or on the toilet paper.  IF YOU HAD POLYPS REMOVED DURING THE COLONOSCOPY:  No aspirin products for 7 days or as instructed.   No alcohol for 7 days or as instructed.   Eat a soft diet for the next 24 hours.  FINDING OUT THE RESULTS OF YOUR TEST Not all test results are available during your visit. If your test results are not back during the visit, make an appointment  with your caregiver to find out the results. Do not assume everything is normal if you have not heard from your caregiver or the medical facility. It is important for you to follow up on all of your test results.  SEEK IMMEDIATE MEDICAL ATTENTION IF:  You have more than a spotting of blood in your stool.   Your belly is swollen (abdominal distention).   You are nauseated or vomiting.   You have a temperature over 101.  You have abdominal pain or discomfort that is severe or gets worse throughout the day.  EGD Discharge instructions Please read the instructions outlined below and refer to this sheet in the next few weeks. These discharge instructions provide you with general information on caring for yourself after you leave the hospital. Your doctor may also give you specific instructions. While your treatment has been planned according to the most current medical practices available, unavoidable complications occasionally occur. If you have any problems or questions after discharge, please call your doctor. ACTIVITY You may resume your regular activity but move at a slower pace for the next 24 hours.  Take frequent rest periods for the next 24 hours.  Walking will help expel (get rid of) the air and reduce the bloated feeling in your abdomen.  No driving for 24 hours (because of the anesthesia (medicine) used during the test).  You may shower.  Do not sign any important legal documents or operate any machinery for  24 hours (because of the anesthesia used during the test).  NUTRITION Drink plenty of fluids.  You may resume your normal diet.  Begin with a light meal and progress to your normal diet.  Avoid alcoholic beverages for 24 hours or as instructed by your caregiver.  MEDICATIONS You may resume your normal medications unless your caregiver tells you otherwise.  WHAT YOU CAN EXPECT TODAY You may experience abdominal discomfort such as a feeling of fullness or gas pains.    FOLLOW-UP Your doctor will discuss the results of your test with you.  SEEK IMMEDIATE MEDICAL ATTENTION IF ANY OF THE FOLLOWING OCCUR: Excessive nausea (feeling sick to your stomach) and/or vomiting.  Severe abdominal pain and distention (swelling).  Trouble swallowing.  Temperature over 101 F (37.8 C).  Rectal bleeding or vomiting of blood.     GERD information provided  Resume Protonix 40 mg daily  Further recommendations to follow pending review of pathology report  Gastroesophageal Reflux Disease, Adult Normally, food travels down the esophagus and stays in the stomach to be digested. However, when a person has gastroesophageal reflux disease (GERD), food and stomach acid move back up into the esophagus. When this happens, the esophagus becomes sore and inflamed. Over time, GERD can create small holes (ulcers) in the lining of the esophagus.  CAUSES This condition is caused by a problem with the muscle between the esophagus and the stomach (lower esophageal sphincter, or LES). Normally, the LES muscle closes after food passes through the esophagus to the stomach. When the LES is weakened or abnormal, it does not close properly, and that allows food and stomach acid to go back up into the esophagus. The LES can be weakened by certain dietary substances, medicines, and medical conditions, including:  Tobacco use.  Pregnancy.  Having a hiatal hernia.  Heavy alcohol use.  Certain foods and beverages, such as coffee, chocolate, onions, and peppermint. RISK FACTORS This condition is more likely to develop in:  People who have an increased body weight.  People who have connective tissue disorders.  People who use NSAID medicines. SYMPTOMS Symptoms of this condition include:  Heartburn.  Difficult or painful swallowing.  The feeling of having a lump in the throat.  Abitter taste in the mouth.  Bad breath.  Having a large amount of saliva.  Having an upset or  bloated stomach.  Belching.  Chest pain.  Shortness of breath or wheezing.  Ongoing (chronic) cough or a night-time cough.  Wearing away of tooth enamel.  Weight loss. Different conditions can cause chest pain. Make sure to see your health care provider if you experience chest pain. DIAGNOSIS Your health care provider will take a medical history and perform a physical exam. To determine if you have mild or severe GERD, your health care provider may also monitor how you respond to treatment. You may also have other tests, including:  An endoscopy toexamine your stomach and esophagus with a small camera.  A test thatmeasures the acidity level in your esophagus.  A test thatmeasures how much pressure is on your esophagus.  A barium swallow or modified barium swallow to show the shape, size, and functioning of your esophagus. TREATMENT The goal of treatment is to help relieve your symptoms and to prevent complications. Treatment for this condition may vary depending on how severe your symptoms are. Your health care provider may recommend:  Changes to your diet.  Medicine.  Surgery. HOME CARE INSTRUCTIONS Diet  Follow a diet as recommended  by your health care provider. This may involve avoiding foods and drinks such as:  Coffee and tea (with or without caffeine).  Drinks that containalcohol.  Energy drinks and sports drinks.  Carbonated drinks or sodas.  Chocolate and cocoa.  Peppermint and mint flavorings.  Garlic and onions.  Horseradish.  Spicy and acidic foods, including peppers, chili powder, curry powder, vinegar, hot sauces, and barbecue sauce.  Citrus fruit juices and citrus fruits, such as oranges, lemons, and limes.  Tomato-based foods, such as red sauce, chili, salsa, and pizza with red sauce.  Fried and fatty foods, such as donuts, french fries, potato chips, and high-fat dressings.  High-fat meats, such as hot dogs and fatty cuts of red and  white meats, such as rib eye steak, sausage, ham, and bacon.  High-fat dairy items, such as whole milk, butter, and cream cheese.  Eat small, frequent meals instead of large meals.  Avoid drinking large amounts of liquid with your meals.  Avoid eating meals during the 2-3 hours before bedtime.  Avoid lying down right after you eat.  Do not exercise right after you eat. General Instructions  Pay attention to any changes in your symptoms.  Take over-the-counter and prescription medicines only as told by your health care provider. Do not take aspirin, ibuprofen, or other NSAIDs unless your health care provider told you to do so.  Do not use any tobacco products, including cigarettes, chewing tobacco, and e-cigarettes. If you need help quitting, ask your health care provider.  Wear loose-fitting clothing. Do not wear anything tight around your waist that causes pressure on your abdomen.  Raise (elevate) the head of your bed 6 inches (15cm).  Try to reduce your stress, such as with yoga or meditation. If you need help reducing stress, ask your health care provider.  If you are overweight, reduce your weight to an amount that is healthy for you. Ask your health care provider for guidance about a safe weight loss goal.  Keep all follow-up visits as told by your health care provider. This is important. SEEK MEDICAL CARE IF:  You have new symptoms.  You have unexplained weight loss.  You have difficulty swallowing, or it hurts to swallow.  You have wheezing or a persistent cough.  Your symptoms do not improve with treatment.  You have a hoarse voice. SEEK IMMEDIATE MEDICAL CARE IF:  You have pain in your arms, neck, jaw, teeth, or back.  You feel sweaty, dizzy, or light-headed.  You have chest pain or shortness of breath.  You vomit and your vomit looks like blood or coffee grounds.  You faint.  Your stool is bloody or black.  You cannot swallow, drink, or eat.     This information is not intended to replace advice given to you by your health care provider. Make sure you discuss any questions you have with your health care provider.   Document Released: 07/07/2005 Document Revised: 06/18/2015 Document Reviewed: 01/22/2015 Elsevier Interactive Patient Education [employer]2016 Else[employer] Inc.

## 2015-08-07 NOTE — Anesthesia Procedure Notes (Signed)
Procedure Name: MAC Date/Time: 08/07/2015 11:33 AM Performed by: Pernell DupreADAMS, AMY A Pre-anesthesia Checklist: Patient identified, Timeout performed, Emergency Drugs available, Suction available and Patient being monitored Oxygen Delivery Method: Simple face mask

## 2015-08-08 ENCOUNTER — Encounter (HOSPITAL_COMMUNITY): Payer: Self-pay | Admitting: Internal Medicine

## 2015-08-08 ENCOUNTER — Encounter: Payer: Self-pay | Admitting: Internal Medicine

## 2015-08-12 ENCOUNTER — Encounter: Payer: Self-pay | Admitting: Internal Medicine

## 2015-08-14 NOTE — Telephone Encounter (Signed)
Letter mailed for him to call us 

## 2015-09-07 ENCOUNTER — Encounter (HOSPITAL_COMMUNITY): Payer: Self-pay | Admitting: *Deleted

## 2015-09-07 ENCOUNTER — Emergency Department (HOSPITAL_COMMUNITY)
Admission: EM | Admit: 2015-09-07 | Discharge: 2015-09-07 | Disposition: A | Discharge status: Home / Self Care | Payer: BLUE CROSS/BLUE SHIELD | Attending: Emergency Medicine | Admitting: Emergency Medicine

## 2015-09-07 DIAGNOSIS — I252 Old myocardial infarction: Secondary | ICD-10-CM | POA: Diagnosis not present

## 2015-09-07 DIAGNOSIS — E669 Obesity, unspecified: Secondary | ICD-10-CM | POA: Insufficient documentation

## 2015-09-07 DIAGNOSIS — N189 Chronic kidney disease, unspecified: Secondary | ICD-10-CM | POA: Insufficient documentation

## 2015-09-07 DIAGNOSIS — I2510 Atherosclerotic heart disease of native coronary artery without angina pectoris: Secondary | ICD-10-CM | POA: Insufficient documentation

## 2015-09-07 DIAGNOSIS — Z79899 Other long term (current) drug therapy: Secondary | ICD-10-CM | POA: Insufficient documentation

## 2015-09-07 DIAGNOSIS — N23 Unspecified renal colic: Secondary | ICD-10-CM

## 2015-09-07 DIAGNOSIS — M109 Gout, unspecified: Secondary | ICD-10-CM | POA: Diagnosis not present

## 2015-09-07 DIAGNOSIS — R739 Hyperglycemia, unspecified: Secondary | ICD-10-CM | POA: Diagnosis not present

## 2015-09-07 DIAGNOSIS — Z87442 Personal history of urinary calculi: Secondary | ICD-10-CM | POA: Diagnosis not present

## 2015-09-07 DIAGNOSIS — K219 Gastro-esophageal reflux disease without esophagitis: Secondary | ICD-10-CM | POA: Insufficient documentation

## 2015-09-07 DIAGNOSIS — R109 Unspecified abdominal pain: Secondary | ICD-10-CM | POA: Diagnosis present

## 2015-09-07 LAB — COMPREHENSIVE METABOLIC PANEL
ALBUMIN: 3.6 g/dL (ref 3.5–5.0)
ALT: 43 U/L (ref 17–63)
ANION GAP: 9 (ref 5–15)
AST: 36 U/L (ref 15–41)
Alkaline Phosphatase: 81 U/L (ref 38–126)
BILIRUBIN TOTAL: 0.6 mg/dL (ref 0.3–1.2)
BUN: 16 mg/dL (ref 6–20)
CHLORIDE: 101 mmol/L (ref 101–111)
CO2: 27 mmol/L (ref 22–32)
Calcium: 8.8 mg/dL — ABNORMAL LOW (ref 8.9–10.3)
Creatinine, Ser: 0.92 mg/dL (ref 0.61–1.24)
GFR calc Af Amer: >60 mL/min (ref >60)
GFR calc non Af Amer: >60 mL/min (ref >60)
GLUCOSE: 197 mg/dL — AB (ref 65–99)
POTASSIUM: 3.7 mmol/L (ref 3.5–5.1)
SODIUM: 137 mmol/L (ref 135–145)
Total Protein: 7.4 g/dL (ref 6.5–8.1)

## 2015-09-07 LAB — CBC WITH DIFFERENTIAL/PLATELET
BASOS ABS: 0 10*3/uL (ref 0.0–0.1)
BASOS PCT: 1 %
EOS ABS: 0.3 10*3/uL (ref 0.0–0.7)
Eosinophils Relative: 4 %
HEMATOCRIT: 45.7 % (ref 39.0–52.0)
Hemoglobin: 15.6 g/dL (ref 13.0–17.0)
Lymphocytes Relative: 20 %
Lymphs Abs: 1.6 10*3/uL (ref 0.7–4.0)
MCH: 29.7 pg (ref 26.0–34.0)
MCHC: 34.1 g/dL (ref 30.0–36.0)
MCV: 86.9 fL (ref 78.0–100.0)
MONO ABS: 0.4 10*3/uL (ref 0.1–1.0)
Monocytes Relative: 5 %
NEUTROS ABS: 5.5 10*3/uL (ref 1.7–7.7)
Neutrophils Relative %: 70 %
PLATELETS: 248 10*3/uL (ref 150–400)
RBC: 5.26 MIL/uL (ref 4.22–5.81)
RDW: 12.6 % (ref 11.5–15.5)
WBC: 7.7 10*3/uL (ref 4.0–10.5)

## 2015-09-07 LAB — URINALYSIS, ROUTINE W REFLEX MICROSCOPIC
Bilirubin Urine: NEGATIVE
GLUCOSE, UA: NEGATIVE mg/dL
KETONES UR: NEGATIVE mg/dL
LEUKOCYTES UA: NEGATIVE
NITRITE: NEGATIVE
PH: 5.5 (ref 5.0–8.0)
Protein, ur: NEGATIVE mg/dL

## 2015-09-07 LAB — URINE MICROSCOPIC-ADD ON
BACTERIA UA: NONE SEEN
WBC UA: NONE SEEN WBC/hpf (ref 0–5)

## 2015-09-07 LAB — LIPASE, BLOOD: Lipase: 27 U/L (ref 11–51)

## 2015-09-07 MED ORDER — SODIUM CHLORIDE 0.9 % IV SOLN: 1000.0000 mL | INTRAVENOUS | Status: DC

## 2015-09-07 MED ORDER — HYDROMORPHONE HCL 1 MG/ML IJ SOLN
1.0000 mg | INTRAMUSCULAR | Status: DC | PRN
  Administered 2015-09-07 (×2): 1 mg via INTRAVENOUS
  Filled 2015-09-07 (×2): qty 1

## 2015-09-07 MED ORDER — ONDANSETRON HCL 4 MG/2ML IJ SOLN
4.0000 mg | Freq: Once | INTRAMUSCULAR | Status: AC
  Administered 2015-09-07: 4 mg via INTRAVENOUS
  Filled 2015-09-07: qty 2

## 2015-09-07 MED ORDER — ONDANSETRON HCL 4 MG PO TABS: 4.0000 mg | ORAL_TABLET | Freq: Four times a day (QID) | ORAL | Status: DC

## 2015-09-07 MED ORDER — OXYCODONE-ACETAMINOPHEN 5-325 MG PO TABS: 1.0000 | ORAL_TABLET | Freq: Four times a day (QID) | ORAL | Status: DC | PRN

## 2015-09-07 MED ORDER — SODIUM CHLORIDE 0.9 % IV SOLN
1000.0000 mL | Freq: Once | INTRAVENOUS | Status: AC
Start: 2015-09-07 — End: 2015-09-07
  Administered 2015-09-07: 1000 mL via INTRAVENOUS

## 2015-09-07 NOTE — ED Notes (Signed)
Pt comes in with right side flank pain starting around 1220. Pt has hx of kidney stones.

## 2015-09-07 NOTE — Discharge Instructions (Signed)
Kidney Stones °Kidney stones (urolithiasis) are deposits that form inside your kidneys. The intense pain is caused by the stone moving through the urinary tract. When the stone moves, the ureter goes into spasm around the stone. The stone is usually passed in the urine.  °CAUSES  °· A disorder that makes certain neck glands produce too much parathyroid hormone (primary hyperparathyroidism). °· A buildup of uric acid crystals, similar to gout in your joints. °· Narrowing (stricture) of the ureter. °· A kidney obstruction present at birth (congenital obstruction). °· Previous surgery on the kidney or ureters. °· Numerous kidney infections. °SYMPTOMS  °· Feeling sick to your stomach (nauseous). °· Throwing up (vomiting). °· Blood in the urine (hematuria). °· Pain that usually spreads (radiates) to the groin. °· Frequency or urgency of urination. °DIAGNOSIS  °· Taking a history and physical exam. °· Blood or urine tests. °· CT scan. °· Occasionally, an examination of the inside of the urinary bladder (cystoscopy) is performed. °TREATMENT  °· Observation. °· Increasing your fluid intake. °· Extracorporeal shock wave lithotripsy--This is a noninvasive procedure that uses shock waves to break up kidney stones. °· Surgery may be needed if you have severe pain or persistent obstruction. There are various surgical procedures. Most of the procedures are performed with the use of small instruments. Only small incisions are needed to accommodate these instruments, so recovery time is minimized. °The size, location, and chemical composition are all important variables that will determine the proper choice of action for you. Talk to your health care provider to better understand your situation so that you will minimize the risk of injury to yourself and your kidney.  °HOME CARE INSTRUCTIONS  °· Drink enough water and fluids to keep your urine clear or pale yellow. This will help you to pass the stone or stone fragments. °· Strain  all urine through the provided strainer. Keep all particulate matter and stones for your health care provider to see. The stone causing the pain may be as small as a grain of salt. It is very important to use the strainer each and every time you pass your urine. The collection of your stone will allow your health care provider to analyze it and verify that a stone has actually passed. The stone analysis will often identify what you can do to reduce the incidence of recurrences. °· Only take over-the-counter or prescription medicines for pain, discomfort, or fever as directed by your health care provider. °· Keep all follow-up visits as told by your health care provider. This is important. °· Get follow-up X-rays if required. The absence of pain does not always mean that the stone has passed. It may have only stopped moving. If the urine remains completely obstructed, it can cause loss of kidney function or even complete destruction of the kidney. It is your responsibility to make sure X-rays and follow-ups are completed. Ultrasounds of the kidney can show blockages and the status of the kidney. Ultrasounds are not associated with any radiation and can be performed easily in a matter of minutes. °· Make changes to your daily diet as told by your health care provider. You may be told to: °¨ Limit the amount of salt that you eat. °¨ Eat 5 or more servings of fruits and vegetables each day. °¨ Limit the amount of meat, poultry, fish, and eggs that you eat. °· Collect a 24-hour urine sample as told by your health care provider. You may need to collect another urine sample every 6-12   months. °SEEK MEDICAL CARE IF: °· You experience pain that is progressive and unresponsive to any pain medicine you have been prescribed. °SEEK IMMEDIATE MEDICAL CARE IF:  °· Pain cannot be controlled with the prescribed medicine. °· You have a fever or shaking chills. °· The severity or intensity of pain increases over 18 hours and is not  relieved by pain medicine. °· You develop a new onset of abdominal pain. °· You feel faint or pass out. °· You are unable to urinate. °  °This information is not intended to replace advice given to you by your health care provider. Make sure you discuss any questions you have with your health care provider. °  °Document Released: 09/27/2005 Document Revised: 06/18/2015 Document Reviewed: 02/28/2013 °Elsevier Interactive Patient Education ©2016 Elsevier Inc. ° °

## 2015-09-07 NOTE — ED Provider Notes (Signed)
CSN: [id number]     Arrival date & time 09/07/15  1250 [id number]   First MD Initiated Contact with Patient 09/07/15 1257     Chief Complaint  Patient presents with  . Flank Pain    Patient is a 54 y.o. male presenting with flank pain. The history is provided by the patient.  Flank Pain This is a new problem. Episode onset: this afternoon. The problem occurs constantly. The problem has not changed since onset.Associated symptoms include abdominal pain. Pertinent negatives include no chest pain, no headaches and no shortness of breath. Nothing aggravates the symptoms. Nothing relieves the symptoms. He has tried nothing for the symptoms.  Pt has a history of kidney stones.  The pain started suddenly today.  At times it is very severe.  It feels similar to the last time he had a kidney stone.  The pain starts in the right flank and shoots down into the groin area.  Past Medical History  Diagnosis Date  . Gout   . Obesity   . Diverticulosis   . Myocardial infarction (HCC)     mild Heart attack 2002  . Chronic kidney disease   . Renal stone     passed   . Seizures (HCC)     as a child  . GERD (gastroesophageal reflux disease)     no medical treatment  . Coronary artery disease    Past Surgical History  Procedure Laterality Date  . Partial colectomy      Dr Bradford:diverticulosis  . Cardiac catheterization    . Throat biop      benign  . Appendectomy    . Back surgery      x5 surgeries  . Colonoscopy  2003    RMR: Normal appearing colonic mucosa to 35-40 cm. Spasm versus persistent swelling and noncompliance of the colon along with patient's discomfort precluded completion of exam.  . Colonoscopy with propofol N/A 08/07/2015    Procedure: COLONOSCOPY WITH PROPOFOL;  Surgeon: Corbin Adeobert M Rourk, MD;  Location: [facility];  Service: Endoscopy;  Laterality: N/A;  1159 in cecum, total withdrawal time is 9min  . Esophagogastroduodenoscopy (egd) with propofol N/A 08/07/2015    Procedure:  ESOPHAGOGASTRODUODENOSCOPY (EGD) WITH PROPOFOL;  Surgeon: Corbin Adeobert M Rourk, MD;  Location: [facility];  Service: Endoscopy;  Laterality: N/A;  . Esophageal dilation N/A 08/07/2015    Procedure: ESOPHAGEAL DILATION;  Surgeon: Corbin Adeobert M Rourk, MD;  Location: [facility];  Service: Endoscopy;  Laterality: N/A;  #56 maloney dilator  . Esophageal biopsy N/A 08/07/2015    Procedure: Gastric Biopsies;  Surgeon: Corbin Adeobert M Rourk, MD;  Location: [facility];  Service: Endoscopy;  Laterality: N/A;   Family History  Problem Relation Age of Onset  . Other      doesn't know family history   Social History  Substance Use Topics  . Smoking status: Never Smoker   . Smokeless tobacco: Former NeurosurgeonUser    Types: Chew  . Alcohol Use: No     Comment: none since 2013, not regular drinker before but would occasional binging    Review of Systems  Constitutional: Negative for fever.  Respiratory: Negative for shortness of breath.   Cardiovascular: Negative for chest pain.  Gastrointestinal: Positive for abdominal pain. Negative for nausea and vomiting.  Genitourinary: Positive for flank pain. Negative for dysuria and hematuria.  Musculoskeletal: Negative for gait problem.  Skin: Negative for rash.  Neurological: Negative for headaches.  All other systems reviewed and are negative.  Allergies  Review of patient's allergies indicates no known allergies.  Home Medications   Prior to Admission medications   Medication Sig Start Date End Date Taking? Authorizing Provider  cyclobenzaprine (FLEXERIL) 10 MG tablet Take 1 tablet by mouth 3 (three) times daily as needed. 08/29/15  Yes Historical Provider, MD  indomethacin (INDOCIN) 25 MG capsule Take 1 capsule (25 mg total) by mouth 3 (three) times daily as needed. Patient taking differently: Take 25 mg by mouth 3 (three) times daily as needed (gout).  07/16/15  Yes Ivery Quale, PA-C  pantoprazole (PROTONIX) 40 MG tablet Take 40 mg by mouth daily.   Yes Historical Provider,  MD  simethicone (GAS-X) 80 MG chewable tablet Chew 80 mg by mouth every 6 (six) hours as needed for flatulence.   Yes Historical Provider, MD  ondansetron (ZOFRAN) 4 MG tablet Take 1 tablet (4 mg total) by mouth every 6 (six) hours. 09/07/15   Linwood Dibbles, MD  oxyCODONE-acetaminophen (PERCOCET/ROXICET) 5-325 MG tablet Take 1-2 tablets by mouth every 6 (six) hours as needed. 09/07/15   Linwood Dibbles, MD   BP 162/100 mmHg  Pulse 75  Temp(Src) 98.2 F (36.8 C) (Oral)  Resp 18  Ht  (1.727 m)  Wt 115.214 kg  BMI 38.63 kg/m2  SpO2 95% Physical Exam  Constitutional: He appears well-developed and well-nourished. No distress.  HENT:  Head: Normocephalic and atraumatic.  Right Ear: External ear normal.  Left Ear: External ear normal.  Eyes: Conjunctivae are normal. Right eye exhibits no discharge. Left eye exhibits no discharge. No scleral icterus.  Neck: Neck supple. No tracheal deviation present.  Cardiovascular: Normal rate, regular rhythm and intact distal pulses.   Pulmonary/Chest: Effort normal and breath sounds normal. No stridor. No respiratory distress. He has no wheezes. He has no rales.  Abdominal: Soft. Bowel sounds are normal. He exhibits no distension. There is no tenderness. There is no rebound and no guarding.  Musculoskeletal: He exhibits no edema or tenderness.  Neurological: He is alert. He has normal strength. No cranial nerve deficit (no facial droop, extraocular movements intact, no slurred speech) or sensory deficit. He exhibits normal muscle tone. He displays no seizure activity. Coordination normal.  Skin: Skin is warm and dry. No rash noted.  Psychiatric: He has a normal mood and affect.  Nursing note and vitals reviewed.   ED Course  Procedures (including critical care time) Labs Review Labs Reviewed  COMPREHENSIVE METABOLIC PANEL - Abnormal; Notable for the following:    Glucose, Bld 197 (*)    Calcium 8.8 (*)    All other components within normal limits   URINALYSIS, ROUTINE W REFLEX MICROSCOPIC (NOT AT [facility]) - Abnormal; Notable for the following:    Specific Gravity, Urine >1.030 (*)    Hgb urine dipstick LARGE (*)    All other components within normal limits  URINE MICROSCOPIC-ADD ON - Abnormal; Notable for the following:    Squamous Epithelial / LPF 0-5 (*)    All other components within normal limits  CBC WITH DIFFERENTIAL/PLATELET  LIPASE, BLOOD   Medications  0.9 %  sodium chloride infusion (1,000 mLs Intravenous New Bag/Given 09/07/15 1310)    Followed by  0.9 %  sodium chloride infusion (not administered)  HYDROmorphone (DILAUDID) injection 1 mg (1 mg Intravenous Given 09/07/15 1350)  ondansetron (ZOFRAN) injection 4 mg (4 mg Intravenous Given 09/07/15 1311)     MDM   Final diagnoses:  Ureteral colic    Patient's symptoms are consistent with a  kidney stone. Patient's laboratory tests show hematuria with correlates with his presumptive kidney stones.  I have rewiewed CT scan reports from prior visits which confirmed ureteral stones.  She was treated with pain medications in the emergency room. Plan on discharge with medications for pain and urology follow-up.  I did mention the elevated blood sugar to the patient.  This should be followed up with primary care doctor.   Linwood Dibbles, MD 09/07/15 [phone]

## 2015-09-17 ENCOUNTER — Ambulatory Visit (INDEPENDENT_AMBULATORY_CARE_PROVIDER_SITE_OTHER): Payer: BLUE CROSS/BLUE SHIELD | Admitting: Nurse Practitioner

## 2015-09-17 ENCOUNTER — Encounter: Payer: Self-pay | Admitting: Nurse Practitioner

## 2015-09-17 ENCOUNTER — Other Ambulatory Visit: Payer: Self-pay

## 2015-09-17 ENCOUNTER — Ambulatory Visit (HOSPITAL_COMMUNITY)
Admission: RE | Admit: 2015-09-17 | Discharge: 2015-09-17 | Disposition: A | Discharge status: Home / Self Care | Payer: BLUE CROSS/BLUE SHIELD | Source: Ambulatory Visit | Attending: Nurse Practitioner | Admitting: Nurse Practitioner

## 2015-09-17 VITALS — BP 178/105 | HR 75 | Temp 96.8°F | Ht 67.0 in | Wt 249.8 lb

## 2015-09-17 DIAGNOSIS — K219 Gastro-esophageal reflux disease without esophagitis: Secondary | ICD-10-CM | POA: Diagnosis not present

## 2015-09-17 DIAGNOSIS — R109 Unspecified abdominal pain: Secondary | ICD-10-CM

## 2015-09-17 DIAGNOSIS — K5900 Constipation, unspecified: Secondary | ICD-10-CM | POA: Insufficient documentation

## 2015-09-17 DIAGNOSIS — R1010 Upper abdominal pain, unspecified: Secondary | ICD-10-CM | POA: Diagnosis not present

## 2015-09-17 NOTE — Patient Instructions (Signed)
1. Increase the amount of water in her diet. 2. Increase amount of fiber in her diet either through fibrous foods such as fruits, vegetables, and whole gr or, through a daily fiber supplement such as Benefiber, fiber gummy chews, or others. 3. Take a daily Colace stool softener. 4. He can also use MiraLAX 17 g (1 capful) daily 3 times a week as needed for continued constipation. He can increase this to one time a day every day if needed. 5. Continue with the plan for an MRI to evaluate your disc in your spine. 6. Return for follow-up in 3 months. 7. Cause of anything gets suddenly worse or weird.

## 2015-09-17 NOTE — Progress Notes (Signed)
Referring Provider: Assunta FoundGolding, John, MD Primary Care Physician:  Colette RibasGOLDING, JOHN CABOT, MD Primary GI:  Dr. Jena Gaussourk  Chief Complaint  Patient presents with  . Follow-up    HPI:   54 year old male presents for follow-up status post endoscopy and colonoscopy as well as follow-up for chest pain felt to be cardiac in nature. EGD completed 08/07/2015 with biopsy and Maloney dilation for dysphagia and chest pain. Findings included normal esophagus status post dilation, hiatal hernia, abnormal stomach with biopsies taken. 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 Recommend continue Protonix 40 mg daily. Colonoscopy completed same day which found status post segmental colonic resection, focally abnormal appearing colonic mucosa of doubtful clinical significance status post biopsy. Pathology of the colon biopsy found lymphoid polyp negative for dysplasia. Recommend repeat colonoscopy in 5 years.  Today he states he's been having abdominal pain at the upper stomach and around to his back. Pain is intermittent, can't identify any percipitating factors, tends to last a day or so and then passes on it's own. Pain described as crampy. Also has bloating with his symptoms. Saw PCP who thinks it's a disk problem and is supposed to have an MRI. Also passed a kidney stone last week. Also had episodic lower abdominal pain which eased with passing gas. Was also constipated with very hard stool. Took 4 doses of laxative without improvement. Took a home enema without relief. Hasn't had a good bowel movement in 1.5 weeks, has had some small stools which were "rock hard." Has had chronic constipation, but never this bad. Denies hematochezia and melena, . Admits one episode of nausea 3 days ago and none since. Denies chest pain, dyspnea, dizziness, lightheadedness, syncope, near syncope. Denies any other upper or lower GI symptoms.  Past Medical History  Diagnosis Date  . Gout   . Obesity   .  Diverticulosis   . Myocardial infarction (HCC)     mild Heart attack 2002  . Chronic kidney disease   . Renal stone     passed   . Seizures (HCC)     as a child  . GERD (gastroesophageal reflux disease)     no medical treatment  . Coronary artery disease     Past Surgical History  Procedure Laterality Date  . Partial colectomy      Dr Bradford:diverticulosis  . Cardiac catheterization    . Throat biop      benign  . Appendectomy    . Back surgery      x5 surgeries  . Colonoscopy  2003    RMR: Normal appearing colonic mucosa to 35-40 cm. Spasm versus persistent swelling and noncompliance of the colon along with patient's discomfort precluded completion of exam.  . Colonoscopy with propofol N/A 08/07/2015    RMR: Status post segmental colonic resection. Focally abnormal appeearing colonic mucosa of doubtful clinical significance status post biopsy.   . Esophagogastroduodenoscopy (egd) with propofol N/A 08/07/2015    RMR: Normal esophagus- status post maloney dilation. Hiatal hernia. Abnormal stomach as described status post biopsy.   . Esophageal dilation N/A 08/07/2015    Procedure: ESOPHAGEAL DILATION;  Surgeon: Corbin Adeobert M Rourk, MD;  Location: [facility];  Service: Endoscopy;  Laterality: N/A;  #56 maloney dilator  . Esophageal biopsy N/A 08/07/2015    Procedure: Gastric Biopsies;  Surgeon: Corbin Adeobert M Rourk, MD;  Location: [facility];  Service: Endoscopy;  Laterality: N/A;    Current Outpatient Prescriptions  Medication Sig Dispense Refill  . cyclobenzaprine (FLEXERIL) 10 MG tablet Take  1 tablet by mouth 3 (three) times daily as needed.  0  . indomethacin (INDOCIN) 25 MG capsule Take 1 capsule (25 mg total) by mouth 3 (three) times daily as needed. (Patient taking differently: Take 25 mg by mouth 3 (three) times daily as needed (gout). ) 30 capsule 0  . ondansetron (ZOFRAN) 4 MG tablet Take 1 tablet (4 mg total) by mouth every 6 (six) hours. 12 tablet 0  . oxyCODONE-acetaminophen  (PERCOCET/ROXICET) 5-325 MG tablet Take 1-2 tablets by mouth every 6 (six) hours as needed. 20 tablet 0  . pantoprazole (PROTONIX) 40 MG tablet Take 40 mg by mouth daily.    . simethicone (GAS-X) 80 MG chewable tablet Chew 80 mg by mouth every 6 (six) hours as needed for flatulence.     No current facility-administered medications for this visit.   Facility-Administered Medications Ordered in Other Visits  Medication Dose Route Frequency Provider Last Rate Last Dose  . gentamicin (GARAMYCIN) 440 mg in dextrose 5 % 100 mL IVPB  440 mg Intravenous 30 min Pre-Op Sebastian Ache, MD        Allergies as of 09/17/2015  . (No Known Allergies)    Family History  Problem Relation Age of Onset  . Other      doesn't know family history    Social History   Social History  . Marital Status: Married    Spouse Name: N/A  . Number of Children: 5  . Years of Education: N/A   Occupational History  . [employer]    Social History Main Topics  . Smoking status: Never Smoker   . Smokeless tobacco: Former Neurosurgeon    Types: Chew  . Alcohol Use: No     Comment: none since 2013, not regular drinker before but would occasional binging  . Drug Use: No  . Sexual Activity: Not Asked   Other Topics Concern  . None   Social History Narrative    Review of Systems: General: Negative for anorexia, weight loss, fever, chills, fatigue, weakness. Eyes: Negative for vision changes.  ENT: Negative for hoarseness, difficulty swallowing. CV: Negative for chest pain, angina, palpitations, peripheral edema.  Respiratory: Negative for dyspnea at rest, cough, sputum, wheezing.  GI: See history of present illness. Endo: Negative for unusual weight change.  Heme: Negative for bruising or bleeding.   Physical Exam: BP 178/105 mmHg  Pulse 75  Temp(Src) 96.8 F (36 C)  Ht  (1.702 m)  Wt 249 lb 12.8 oz (113.309 kg)  BMI 39.12 kg/m2 General:   Morbidly obese male alert and oriented. Pleasant and  cooperative. Well-nourished and well-developed.  Head:  Normocephalic and atraumatic. Eyes:  Without icterus, sclera clear and conjunctiva pink.  Ears:  Normal auditory acuity. Cardiovascular:  S1, S2 present without murmurs appreciated. Extremities without clubbing or edema. Respiratory:  Clear to auscultation bilaterally. No wheezes, rales, or rhonchi. No distress.  Gastrointestinal:  +BS, soft, and non-distended. Mild lower abdominal TTP. No HSM noted. No guarding or rebound. No masses appreciated.  Rectal:  Deferred  Neurologic:  Alert and oriented x4;  grossly normal neurologically. Psych:  Alert and cooperative. Normal mood and affect.    09/17/2015 10:31 AM

## 2015-09-24 NOTE — Assessment & Plan Note (Signed)
Vision with abdominal pain and also noted recent constipation with hard stools, bloating, abdominal pain, and straining. Last colonoscopy done 08/07/2015 with recommended repeat in 5 years. Order an abdominal x-ray to check for stool burden. Bowel regimen as noted above. Continue plan for MRI to evaluate for possible bad disc which PCP feels is at least partially responsible for his symptoms. Return for follow-up in 3 months.

## 2015-09-24 NOTE — Assessment & Plan Note (Signed)
Abdominal pain does not seem to be related to GERD. No dyspepsia symptoms, continue current PPI therapy. Return for follow-up in 3 months.

## 2015-09-24 NOTE — Assessment & Plan Note (Signed)
This with constipation with recent noted heart stools, incomplete emptying, has not had a good bowel movement about 1-1/2 weeks despite laxatives and home enema. We'll start him on a bowel regimen of daily Colace, increased water and fiber in his diet, MiraLAX 17 g once daily. Return for follow-up in 3 months.

## 2015-09-25 NOTE — Progress Notes (Signed)
CC'ED TO PCP 

## 2015-12-17 ENCOUNTER — Ambulatory Visit: Payer: BLUE CROSS/BLUE SHIELD | Admitting: Nurse Practitioner

## 2016-02-20 IMAGING — CT CT ABD-PELV W/ CM
2 of 5 series · 16 of 46 positions shown, 18 images · IV contrast (Omnipaque 300)
Comparison: February 16, 2008

CLINICAL DATA: Left-sided mid abdominal pain left-sided flank pain
for 3 days

EXAM:
CT ABDOMEN AND PELVIS WITH CONTRAST
TECHNIQUE: Multidetector CT imaging of the abdomen and pelvis was performed
using the standard protocol following bolus administration of
intravenous contrast.
CONTRAST:  100mL OMNIPAQUE IOHEXOL 300 MG/ML  SOLN

[Series 2: abd_pel_with 5.0 b40f · axial · 0.89mm/px · z∈[-474,-40]mm · 13 of 99 slices shown, 15 images]
[im 6/99  soft-tissue]
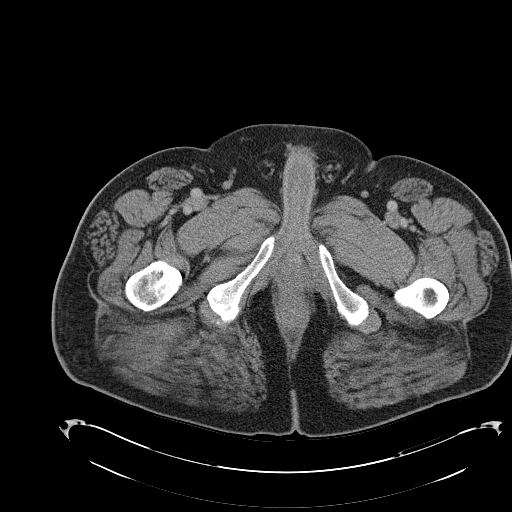
[im 6/99  bone]
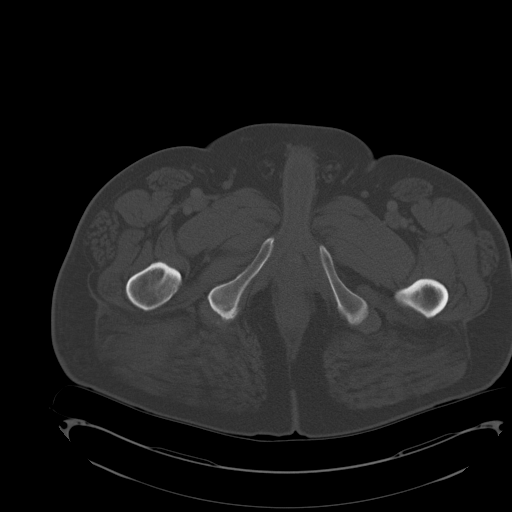
[im 16/99  soft-tissue]
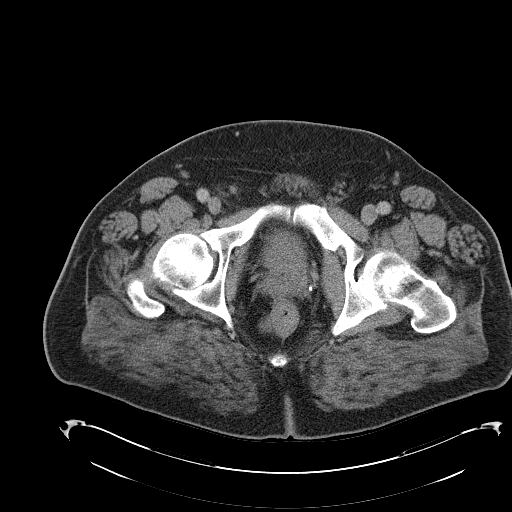
[im 21/99  soft-tissue]
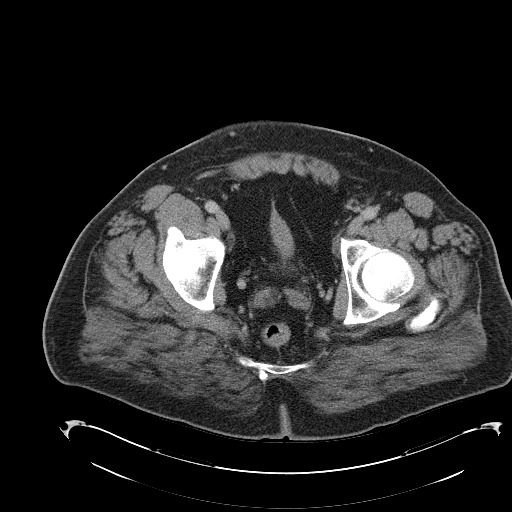
[im 26/99  soft-tissue]
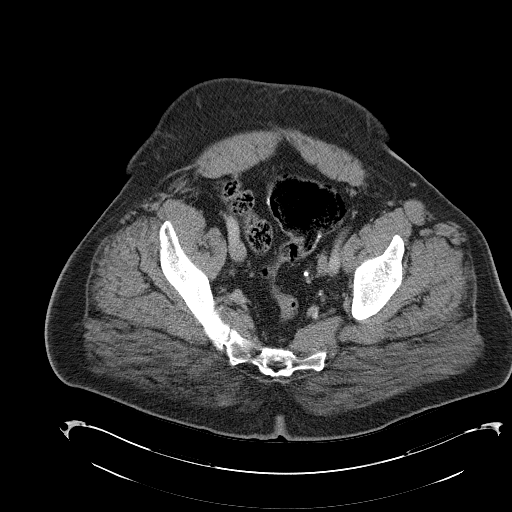
[im 37/99  soft-tissue]
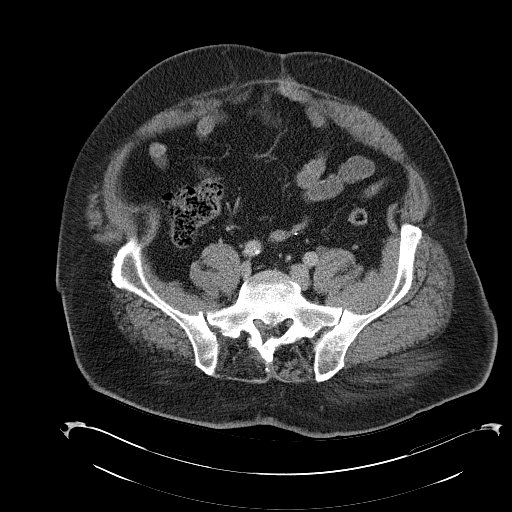
[im 42/99  soft-tissue]
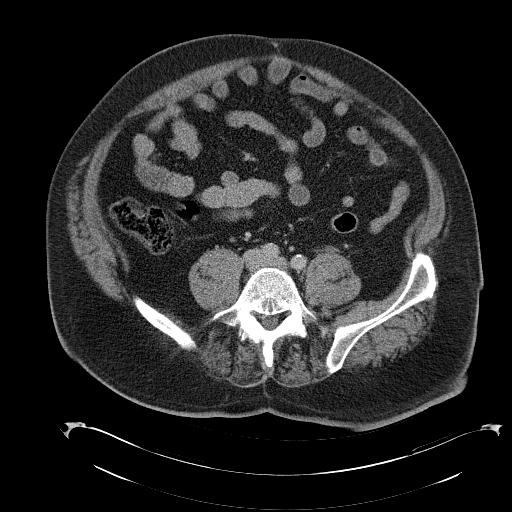
[im 52/99  soft-tissue]
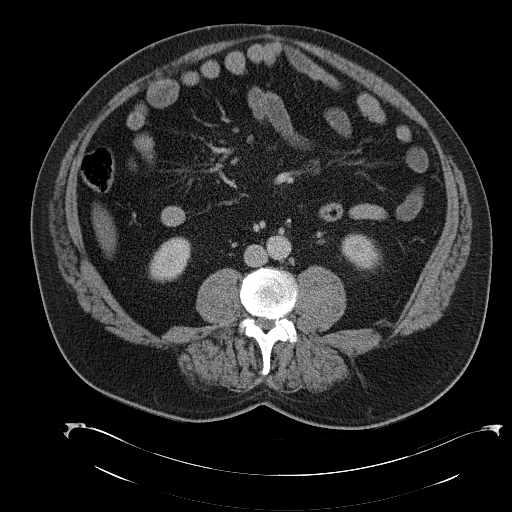
[im 57/99  soft-tissue]
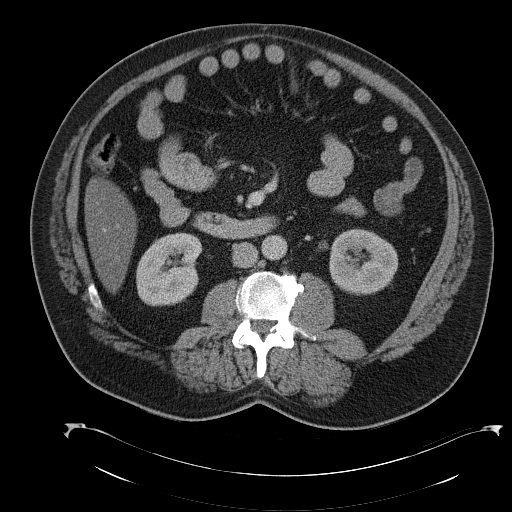
[im 62/99  soft-tissue]
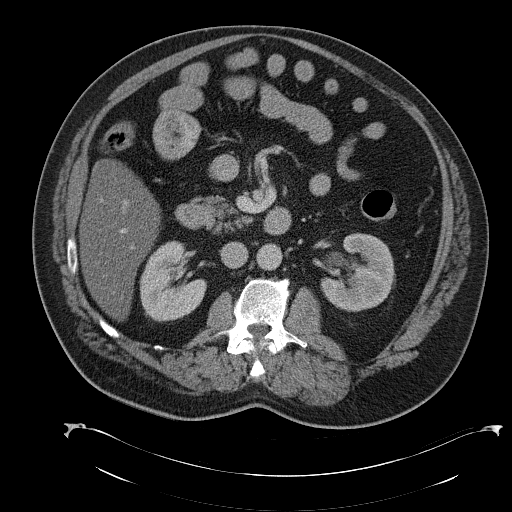
[im 62/99  bone]
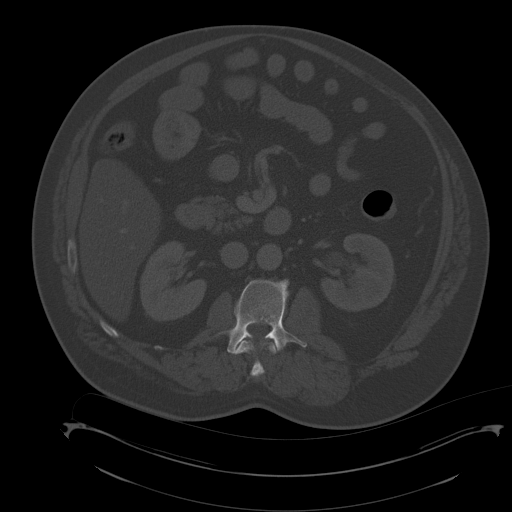
[im 73/99  soft-tissue]
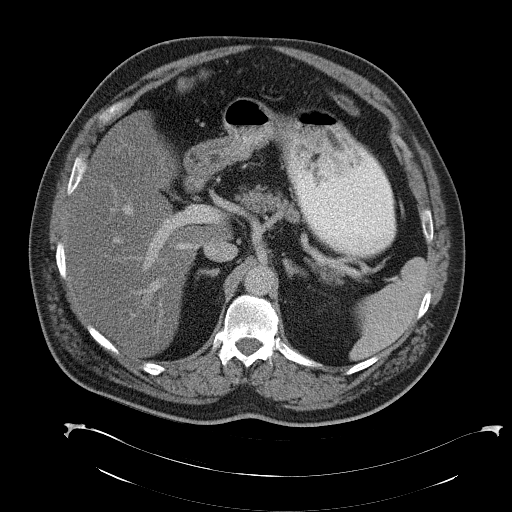
[im 78/99  soft-tissue]
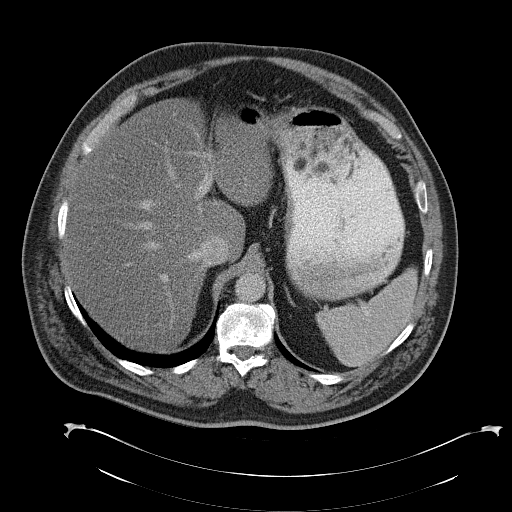
[im 83/99  soft-tissue]
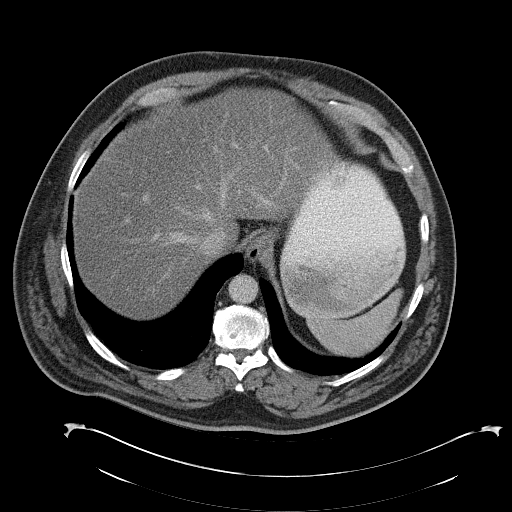
[im 93/99  soft-tissue]
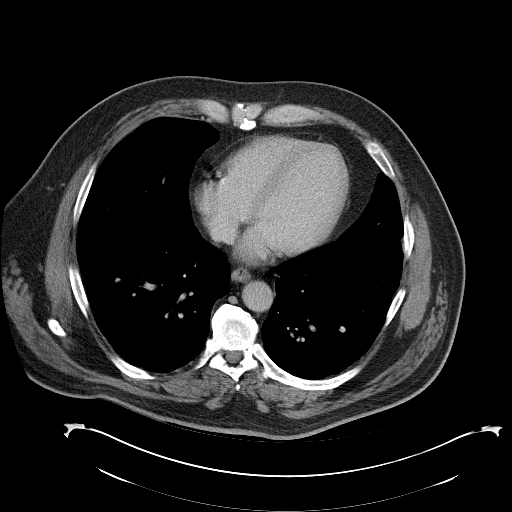

[Series 4: abd_pel_with 3.0 spo · coronal · 0.76mm/px · 3 of 110 slices shown]
[im 37/110  soft-tissue]
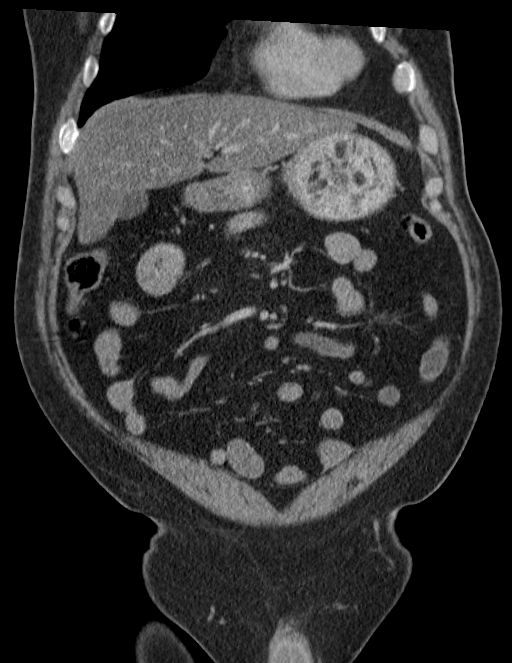
[im 49/110  soft-tissue]
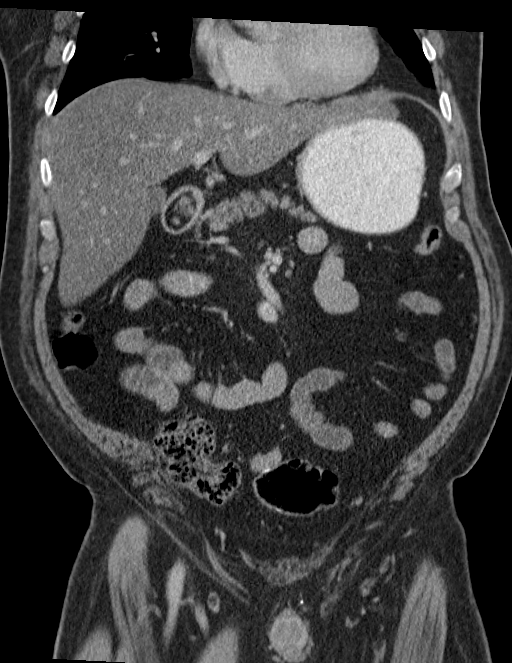
[im 61/110  soft-tissue]
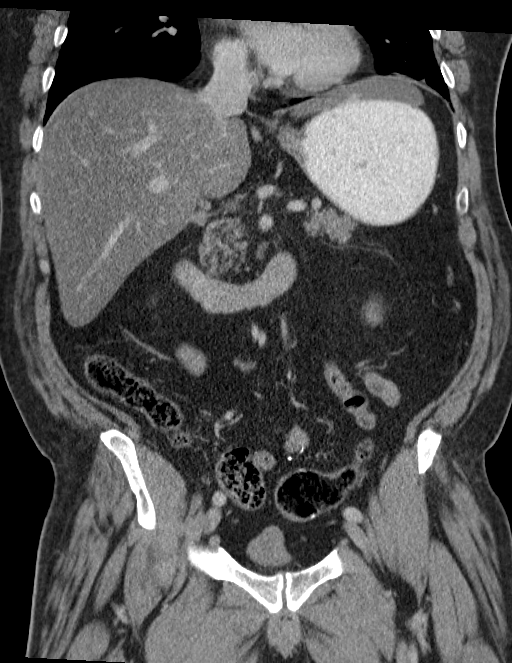

[16 of 46 positions shown; findings below may reference images not displayed]

FINDINGS: There is mild atelectatic change in the anterior left base. There is
minimal atelectatic change in the posterior right base.

There is diffuse hepatic steatosis. No focal liver lesions are
identified. Gallbladder wall is not thickened. There is no biliary
duct dilatation.

Spleen, pancreas, and adrenals appear normal.

The right kidney shows no mass or hydronephrosis. There is no
right-sided renal or ureteral calculus.

On the left, there is a 1.1 x 1.1 cm cyst in the medial lower pole
kidney region. There is mild hydronephrosis. There is no intrarenal
calculus. There is a focal calculus measuring 3 mm in the left
ureter at the level of the upper left acetabulum. No other ureteral
calculus is appreciable.

In the pelvis, the urinary bladder is decompressed. There may be
mild fold thickening in the urinary bladder. There are scattered
prostatic calculi. There is no pelvic mass or pelvic fluid
collection. Appendix is absent. Postoperative change involving the
sigmoid colon is noted with the anastomosis patent.

There is no bowel obstruction. No free air or portal venous air.
There is no appreciable ascites, adenopathy, or abscess in the
abdomen or pelvis. There is no demonstrable abdominal aortic
aneurysm. There are no blastic or lytic bone lesions.
IMPRESSION: 3 mm calculus in the left ureter at the superior left acetabular
level with mild hydronephrosis on the left.

Appendix absent.

Question a degree of cystitis. There are scattered prostatic
calculi.

Postoperative change involving the sigmoid colon with anastomosis
patent. No bowel obstruction. No abscess.

Diffuse hepatic steatosis.

## 2016-02-24 IMAGING — DX DG ABDOMEN 1V
2 series · 2 of 2 positions shown · non-contrast
Comparison: CT 01/01/2015

CLINICAL DATA: Intermittent left flank pain for 10 days.

EXAM:
ABDOMEN - 1 VIEW

[abdomen supine (1 of 2)]
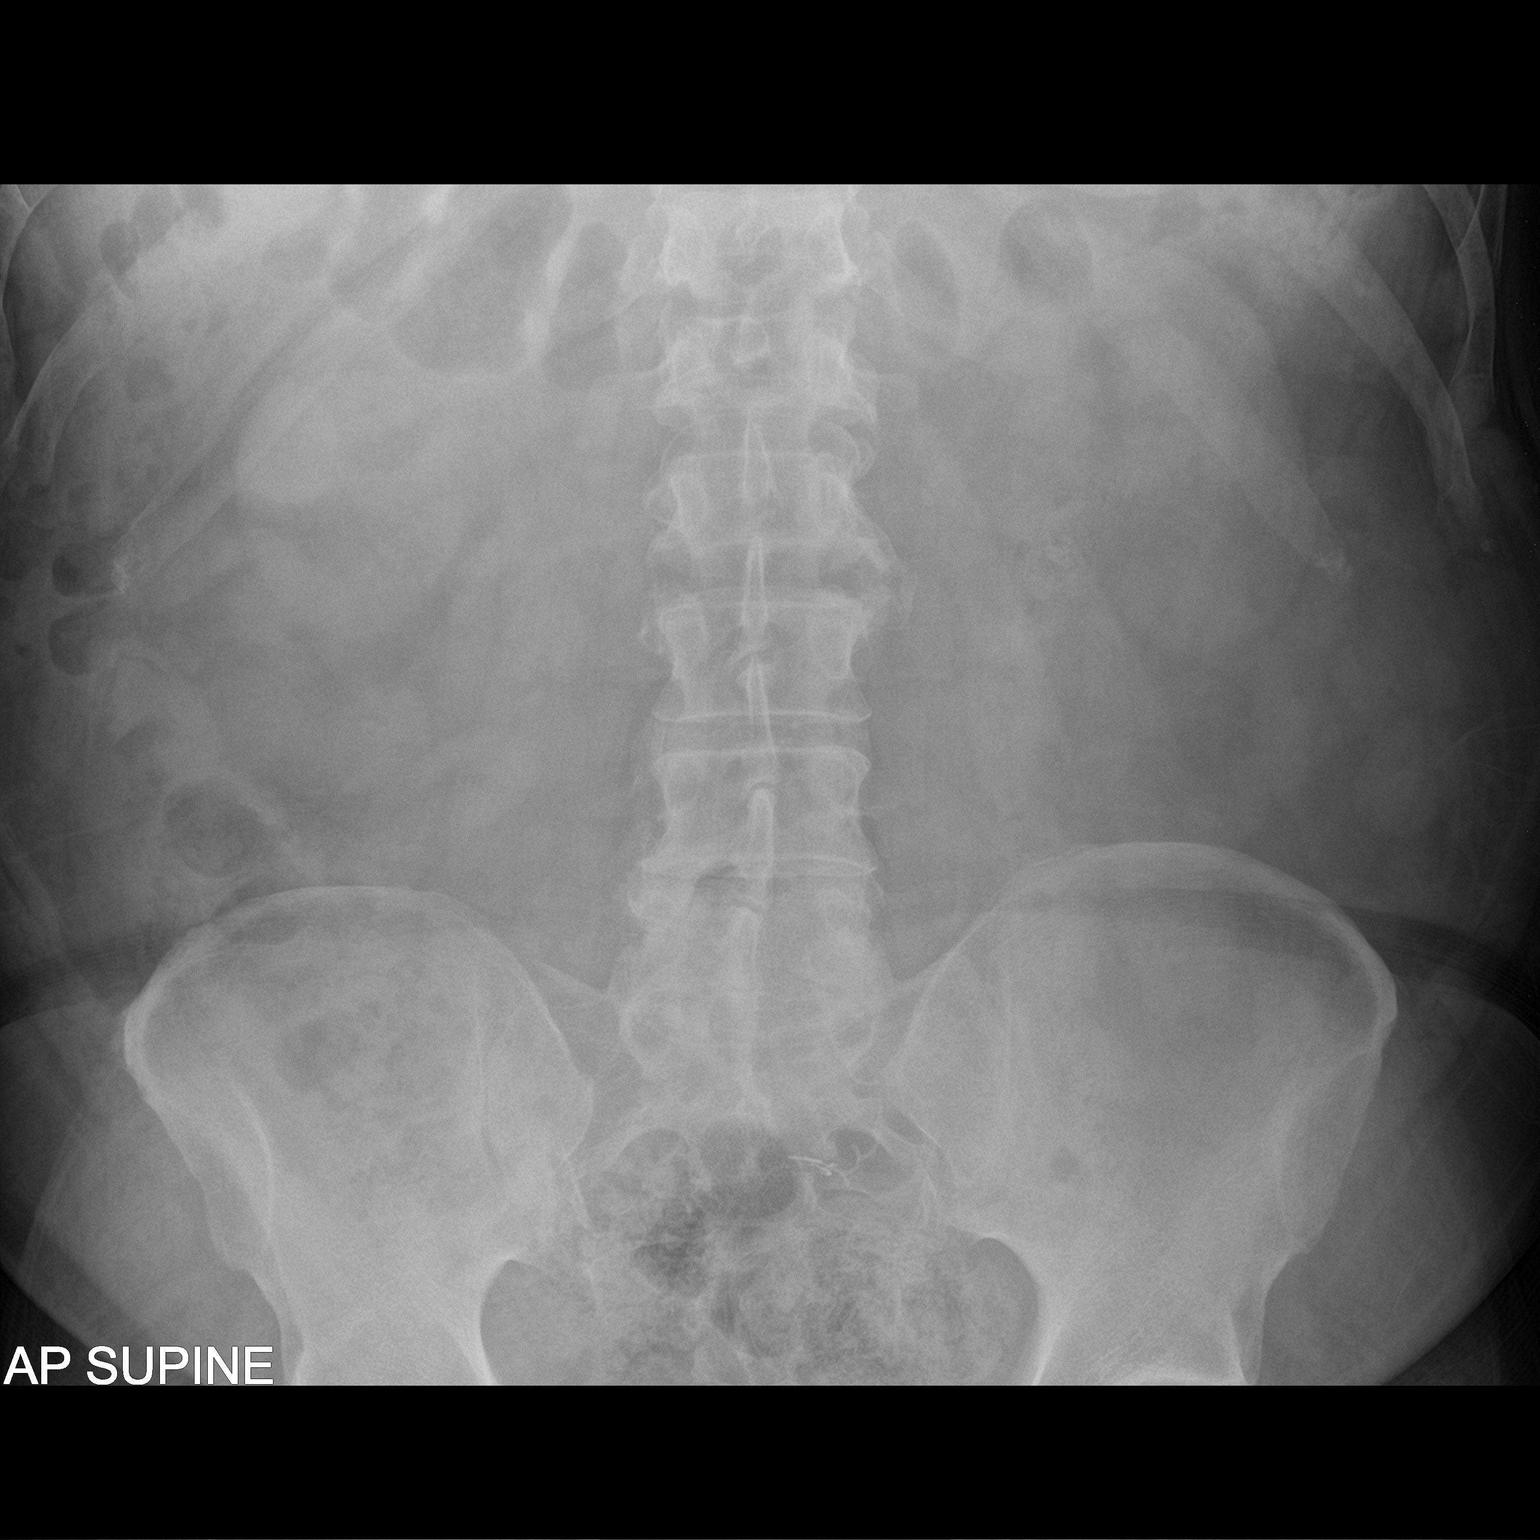

[abdomen supine (2 of 2)]
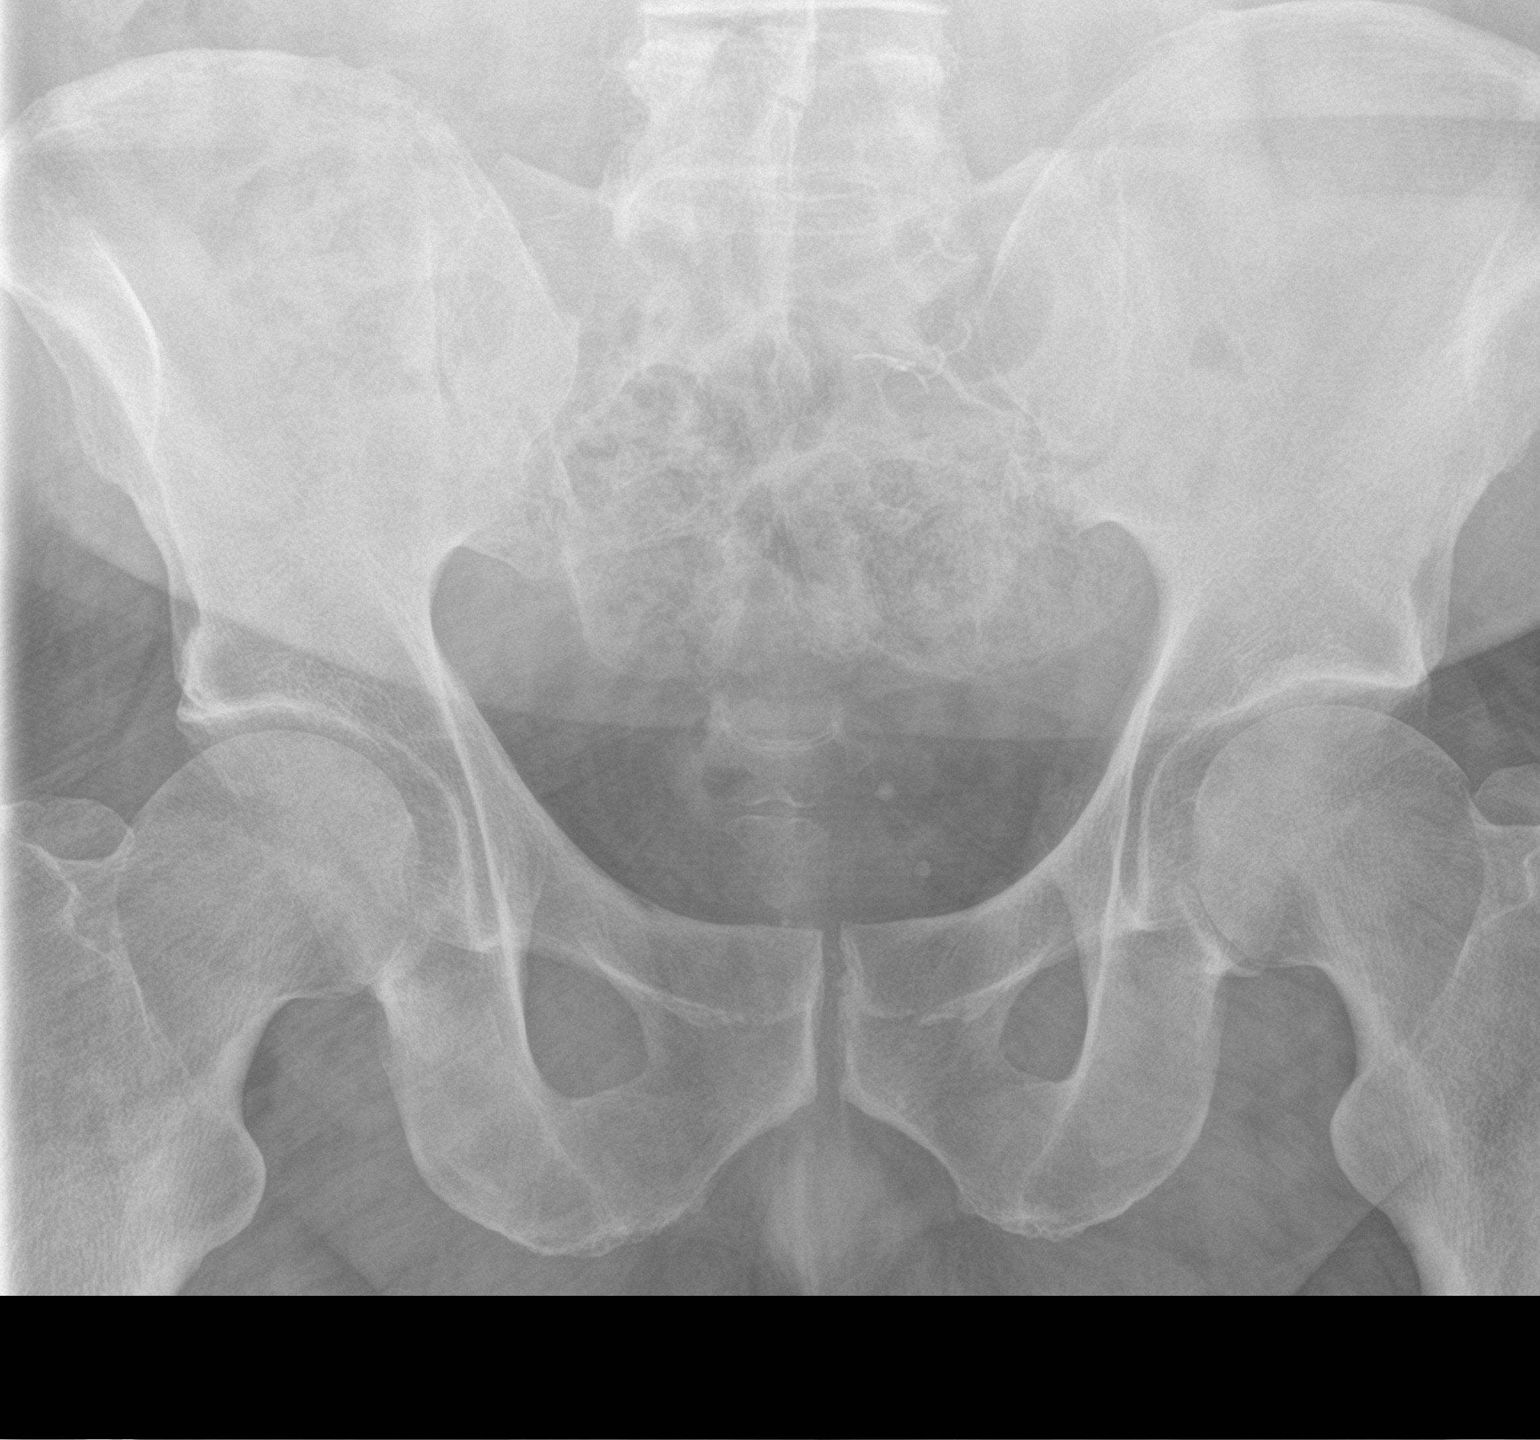

[2 of 2 positions shown; findings below may reference images not displayed]

FINDINGS: Small calcification projects in the left lower pelvis, possibly the
distal left ureteral stone seen on prior CT. Warrant scratch head
inferior to this calcification is a second calcification which
likely represents a phlebolith. No additional calcifications.
Nonobstructive bowel gas pattern. No acute bony abnormality.
IMPRESSION: Distal left ureteral stone likely remains present. This may have
migrated distally slightly since prior CT.

## 2016-04-21 ENCOUNTER — Encounter (HOSPITAL_COMMUNITY): Payer: Self-pay | Admitting: Emergency Medicine

## 2016-04-21 ENCOUNTER — Emergency Department (HOSPITAL_COMMUNITY)
Admission: EM | Admit: 2016-04-21 | Discharge: 2016-04-21 | Disposition: A | Discharge status: Home / Self Care | Payer: BLUE CROSS/BLUE SHIELD | Attending: Emergency Medicine | Admitting: Emergency Medicine

## 2016-04-21 DIAGNOSIS — Z79899 Other long term (current) drug therapy: Secondary | ICD-10-CM | POA: Insufficient documentation

## 2016-04-21 DIAGNOSIS — M10021 Idiopathic gout, right elbow: Secondary | ICD-10-CM | POA: Insufficient documentation

## 2016-04-21 DIAGNOSIS — I252 Old myocardial infarction: Secondary | ICD-10-CM | POA: Insufficient documentation

## 2016-04-21 DIAGNOSIS — I2510 Atherosclerotic heart disease of native coronary artery without angina pectoris: Secondary | ICD-10-CM | POA: Insufficient documentation

## 2016-04-21 DIAGNOSIS — M109 Gout, unspecified: Secondary | ICD-10-CM

## 2016-04-21 MED ORDER — DEXAMETHASONE SODIUM PHOSPHATE 10 MG/ML IJ SOLN
10.0000 mg | Freq: Once | INTRAMUSCULAR | Status: AC
  Administered 2016-04-21: 10 mg via INTRAMUSCULAR
  Filled 2016-04-21: qty 1

## 2016-04-21 MED ORDER — HYDROCODONE-ACETAMINOPHEN 5-325 MG PO TABS: 1.0000 | ORAL_TABLET | ORAL | Status: DC | PRN

## 2016-04-21 MED ORDER — ALLOPURINOL 300 MG PO TABS: 300.0000 mg | ORAL_TABLET | Freq: Every day | ORAL | Status: DC

## 2016-04-21 NOTE — ED Notes (Signed)
MD at bedside. 

## 2016-04-21 NOTE — ED Notes (Signed)
PT states known history of gout with flare ups every 6 months. PT c/o right elbow swelling, pain, redness x2 days unrelieved by Indocin medication.

## 2016-04-21 NOTE — Discharge Instructions (Signed)
Gout  Gout is when your joints become red, sore, and swell (inflamed). This is caused by the buildup of uric acid crystals in the joints. Uric acid is a chemical that is normally in the blood. If the level of uric acid gets too high in the blood, these crystals form in your joints and tissues. Over time, these crystals can form into masses near the joints and tissues. These masses can destroy bone and cause the bone to look misshapen (deformed).  HOME CARE    Do not take aspirin for pain.   Only take medicine as told by your doctor.   Rest the joint as much as you can. When in bed, keep sheets and blankets off painful areas.   Keep the sore joints raised (elevated).   Put warm or cold packs on painful joints. Use of warm or cold packs depends on which works best for you.   Use crutches if the painful joint is in your leg.   Drink enough fluids to keep your pee (urine) clear or pale yellow. Limit alcohol, sugary drinks, and drinks with fructose in them.   Follow your diet instructions. Pay careful attention to how much protein you eat. Include fruits, vegetables, whole grains, and fat-free or low-fat milk products in your daily diet. Talk to your doctor or dietitian about the use of coffee, vitamin C, and cherries. These may help lower uric acid levels.   Keep a healthy body weight.  GET HELP RIGHT AWAY IF:    You have watery poop (diarrhea), throw up (vomit), or have any side effects from medicines.   You do not feel better in 24 hours, or you are getting worse.   Your joint becomes suddenly more tender, and you have chills or a fever.  MAKE SURE YOU:    Understand these instructions.   Will watch your condition.   Will get help right away if you are not doing well or get worse.     This information is not intended to replace advice given to you by your health care provider. Make sure you discuss any questions you have with your health care provider.     Document Released: 07/06/2008 Document Revised:  10/18/2014 Document Reviewed: 05/10/2012  Elsevier Interactive Patient Education 2016 [employer]  Low-Purine Diet  Purines are compounds that affect the level of uric acid in your body. A low-purine diet is a diet that is low in purines. Eating a low-purine diet can prevent the level of uric acid in your body from getting too high and causing gout or kidney stones or both.  WHAT DO I NEED TO KNOW ABOUT THIS DIET?   Choose low-purine foods. Examples of low-purine foods are listed in the next section.   Drink plenty of fluids, especially water. Fluids can help remove uric acid from your body. Try to drink 8-16 cups (1.9-3.8 L) a day.   Limit foods high in fat, especially saturated fat, as fat makes it harder for the body to get rid of uric acid. Foods high in saturated fat include pizza, cheese, ice cream, whole milk, fried foods, and gravies. Choose foods that are lower in fat and lean sources of protein. Use olive oil when cooking as it contains healthy fats that are not high in saturated fat.   Limit alcohol. Alcohol interferes with the elimination of uric acid from your body. If you are having a gout attack, avoid all alcohol.   Keep in mind that different people's bodies   react differently to different foods. You will probably learn over time which foods do or do not affect you. If you discover that a food tends to cause your gout to flare up, avoid eating that food. You can more freely enjoy foods that do not cause problems. If you have any questions about a food item, talk to your dietitian or health care provider.  WHICH FOODS ARE LOW, MODERATE, AND HIGH IN PURINES?  The following is a list of foods that are low, moderate, and high in purines. You can eat any amount of the foods that are low in purines. You may be able to have small amounts of foods that are moderate in purines. Ask your health care provider how much of a food moderate in purines you can have. Avoid foods high in  purines.  Grains   Foods low in purines: Enriched white bread, pasta, rice, cake, cornbread, popcorn.   Foods moderate in purines: Whole-grain breads and cereals, wheat germ, bran, oatmeal. Uncooked oatmeal. Dry wheat bran or wheat germ.   Foods high in purines: Pancakes, French toast, biscuits, muffins.  Vegetables   Foods low in purines: All vegetables, except those that are moderate in purines.   Foods moderate in purines: Asparagus, cauliflower, spinach, mushrooms, green peas.  Fruits   All fruits are low in purines.  Meats and other Protein Foods   Foods low in purines: Eggs, nuts, peanut butter.   Foods moderate in purines: 80-90% lean beef, lamb, veal, pork, poultry, fish, eggs, peanut butter, nuts. Crab, lobster, oysters, and shrimp. Cooked dried beans, peas, and lentils.   Foods high in purines: Anchovies, sardines, herring, mussels, tuna, codfish, scallops, trout, and haddock. Bacon. Organ meats (such as liver or kidney). Tripe. Game meat. Goose. Sweetbreads.  Dairy   All dairy foods are low in purines. Low-fat and fat-free dairy products are best because they are low in saturated fat.  Beverages   Drinks low in purines: Water, carbonated beverages, tea, coffee, cocoa.   Drinks moderate in purines: Soft drinks and other drinks sweetened with high-fructose corn syrup. Juices. To find whether a food or drink is sweetened with high-fructose corn syrup, look at the ingredients list.   Drinks high in purines: Alcoholic beverages (such as beer).  Condiments   Foods low in purines: Salt, herbs, olives, pickles, relishes, vinegar.   Foods moderate in purines: Butter, margarine, oils, mayonnaise.  Fats and Oils   Foods low in purines: All types, except gravies and sauces made with meat.   Foods high in purines: Gravies and sauces made with meat.  Other Foods   Foods low in purines: Sugars, sweets, gelatin. Cake. Soups made without meat.   Foods moderate in purines: Meat-based or fish-based soups,  broths, or bouillons. Foods and drinks sweetened with high-fructose corn syrup.   Foods high in purines: High-fat desserts (such as ice cream, cookies, cakes, pies, doughnuts, and chocolate).  Contact your dietitian for more information on foods that are not listed here.     This information is not intended to replace advice given to you by your health care provider. Make sure you discuss any questions you have with your health care provider.     Document Released: 01/22/2011 Document Revised: 10/02/2013 Document Reviewed: 09/03/2013  Elsevier Interactive Patient Education 2016 [employer]

## 2016-04-21 NOTE — ED Provider Notes (Signed)
CSN: [id number]     Arrival date & time 04/21/16  0902 History  By signing my name below, I, John Wallace, attest that this documentation has been prepared under the direction and in the presence of Jacalyn LefevreJulie Anella Nakata, MD. Electronically Signed: Placido SouLogan Wallace, ED Scribe. 04/21/2016. 9:16 AM.   Chief Complaint  Patient presents with  . Gout   The history is provided by the patient. No language interpreter was used.    HPI Comments: John Wallace is a 55 y.o. male with a PMHx of gout and obesity who presents to the Emergency Department complaining of constant, moderate, throbbing, right elbow pain x 2 days. Pt states he began taking his rx indocin at the onset of his symptoms which provides short term relief with his symptoms typically worsening each night noting that upon waking this morning he could barely move his right elbow. He reports associated, moderate, right elbow swelling and redness. Pt confirms his PMHx of gout stating that his current symptoms are consistent with past gout flare ups. Pt denies taking any other medications for his gout flare ups and denies colchicine has provided relief in the past. He denies fevers and chills.   Past Medical History  Diagnosis Date  . Gout   . Obesity   . Diverticulosis   . Myocardial infarction (HCC)     mild Heart attack 2002  . Chronic kidney disease   . Renal stone     passed   . Seizures (HCC)     as a child  . GERD (gastroesophageal reflux disease)     no medical treatment  . Coronary artery disease    Past Surgical History  Procedure Laterality Date  . Partial colectomy      Dr Bradford:diverticulosis  . Cardiac catheterization    . Throat biop      benign  . Appendectomy    . Back surgery      x5 surgeries  . Colonoscopy  2003    RMR: Normal appearing colonic mucosa to 35-40 cm. Spasm versus persistent swelling and noncompliance of the colon along with patient's discomfort precluded completion of exam.  . Colonoscopy  with propofol N/A 08/07/2015    RMR: Status post segmental colonic resection. Focally abnormal appeearing colonic mucosa of doubtful clinical significance status post biopsy.   . Esophagogastroduodenoscopy (egd) with propofol N/A 08/07/2015    RMR: Normal esophagus- status post maloney dilation. Hiatal hernia. Abnormal stomach as described status post biopsy.   . Esophageal dilation N/A 08/07/2015    Procedure: ESOPHAGEAL DILATION;  Surgeon: Corbin Adeobert M Rourk, MD;  Location: [facility];  Service: Endoscopy;  Laterality: N/A;  #56 maloney dilator  . Biopsy N/A 08/07/2015    Procedure: Gastric Biopsies;  Surgeon: Corbin Adeobert M Rourk, MD;  Location: [facility];  Service: Endoscopy;  Laterality: N/A;   Family History  Problem Relation Age of Onset  . Other      doesn't know family history   Social History  Substance Use Topics  . Smoking status: Never Smoker   . Smokeless tobacco: Former NeurosurgeonUser    Types: Chew  . Alcohol Use: No     Comment: none since 2013, not regular drinker before but would occasional binging    Review of Systems  Constitutional: Negative for fever and chills.  Musculoskeletal: Positive for joint swelling and arthralgias.  Skin: Positive for color change.  All other systems reviewed and are negative.   Allergies  Review of patient's allergies indicates no known allergies.  Home Medications   Prior to Admission medications   Medication Sig Start Date End Date Taking? Authorizing Provider  allopurinol (ZYLOPRIM) 300 MG tablet Take 1 tablet (300 mg total) by mouth daily. 04/21/16   Jacalyn Lefevre, MD  cyclobenzaprine (FLEXERIL) 10 MG tablet Take 1 tablet by mouth 3 (three) times daily as needed. 08/29/15   Historical Provider, MD  HYDROcodone-acetaminophen (NORCO/VICODIN) 5-325 MG tablet Take 1 tablet by mouth every 4 (four) hours as needed. 04/21/16   Jacalyn Lefevre, MD  indomethacin (INDOCIN) 25 MG capsule Take 1 capsule (25 mg total) by mouth 3 (three) times daily as  needed. Patient taking differently: Take 25 mg by mouth 3 (three) times daily as needed (gout).  07/16/15   Ivery Quale, PA-C  ondansetron (ZOFRAN) 4 MG tablet Take 1 tablet (4 mg total) by mouth every 6 (six) hours. 09/07/15   Linwood Dibbles, MD  oxyCODONE-acetaminophen (PERCOCET/ROXICET) 5-325 MG tablet Take 1-2 tablets by mouth every 6 (six) hours as needed. 09/07/15   Linwood Dibbles, MD  pantoprazole (PROTONIX) 40 MG tablet Take 40 mg by mouth daily.    Historical Provider, MD  simethicone (GAS-X) 80 MG chewable tablet Chew 80 mg by mouth every 6 (six) hours as needed for flatulence.    Historical Provider, MD   BP 154/91 mmHg  Pulse 78  Temp(Src) 98.1 F (36.7 C) (Oral)  Resp 16  Ht  (1.702 m)  Wt 235 lb (106.595 kg)  BMI 36.80 kg/m2  SpO2 97%    Physical Exam  Constitutional: He is oriented to person, place, and time. He appears well-developed and well-nourished.  HENT:  Head: Normocephalic and atraumatic.  Eyes: EOM are normal.  Neck: Normal range of motion.  Cardiovascular: Normal rate.   Pulmonary/Chest: Effort normal. No respiratory distress.  Abdominal: Soft.  Musculoskeletal: He exhibits edema and tenderness.  Red and swollen right elbow with DROM. Elbow is consistent with prior gout flare ups.   Neurological: He is alert and oriented to person, place, and time.  Skin: Skin is warm and dry. There is erythema.  Psychiatric: He has a normal mood and affect.  Nursing note and vitals reviewed.   ED Course  Procedures  DIAGNOSTIC STUDIES: Oxygen Saturation is 97% on RA, normal by my interpretation.    COORDINATION OF CARE: 9:13 AM Discussed next steps with pt. Pt verbalized understanding and is agreeable with the plan.   Labs Review Labs Reviewed - No data to display  Imaging Review No results found.   EKG Interpretation None      MDM  Right elbow pain is c/w prior gout flare ups.  I have a very low suspicion for a septic joint as no fever, trauma, and prior  gout hx.  Pt said that decadron helps him, so I will give him a dose of that now and will d/c him on lortab (#10) and allopurinol to start when flare is over.  I counseled him on appropriate food to eat. Colchicine does not work for pt, so I will hold off on that for now.   Final diagnoses:  Acute gout of right elbow, unspecified cause    I personally performed the services described in this documentation, which was scribed in my presence. The recorded information has been reviewed and is accurate.    Jacalyn Lefevre, MD 04/21/16 [phone]

## 2016-08-07 IMAGING — DX DG CHEST 2V
2 series · 2 of 2 positions shown · non-contrast
Comparison: Chest CTA 05/28/2015 and earlier.

CLINICAL DATA: 54-year-old male with intermittent shortness of
breath and chest pain since [REDACTED]. Subsequent encounter.

EXAM:
CHEST  2 VIEW

[chest pa]
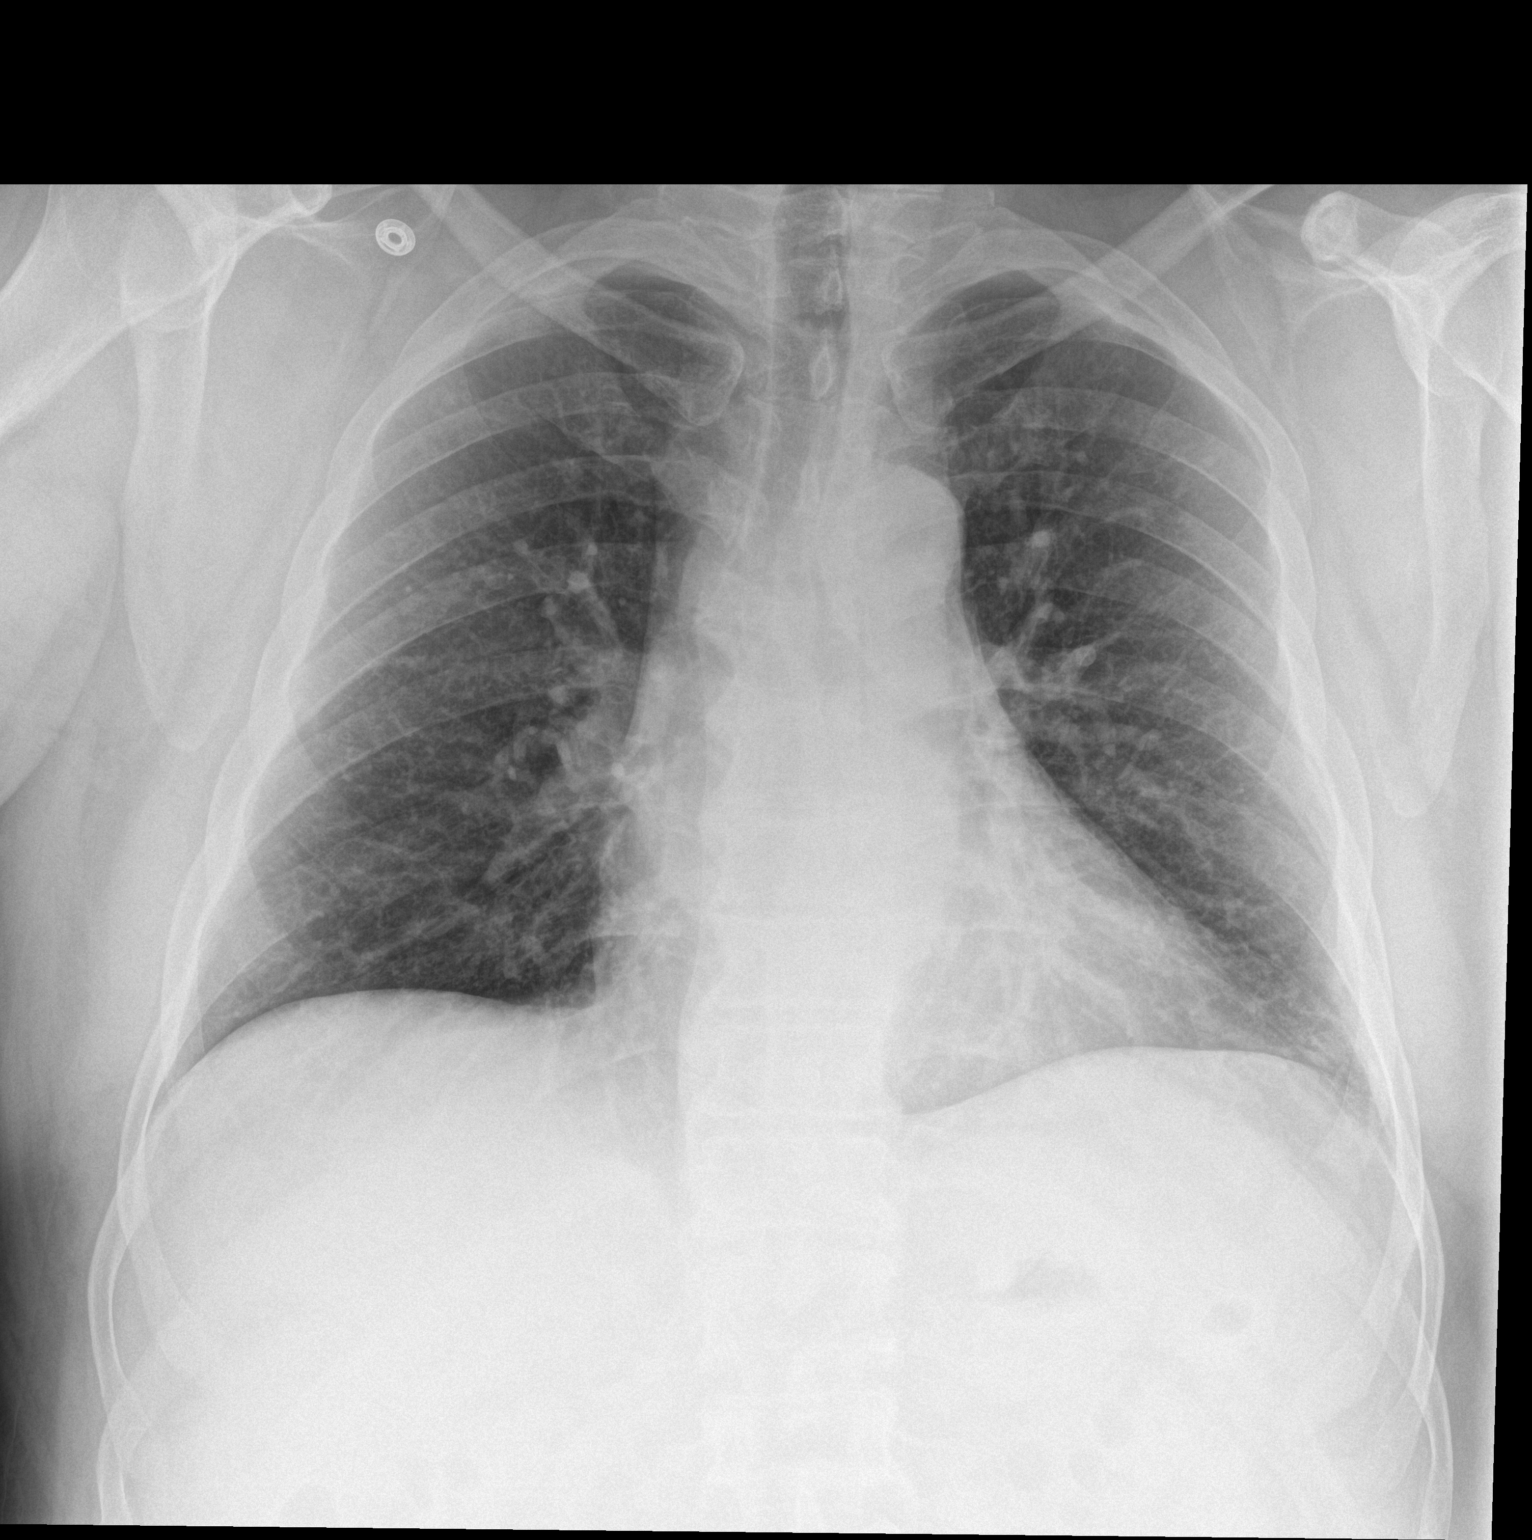

[chest lat]
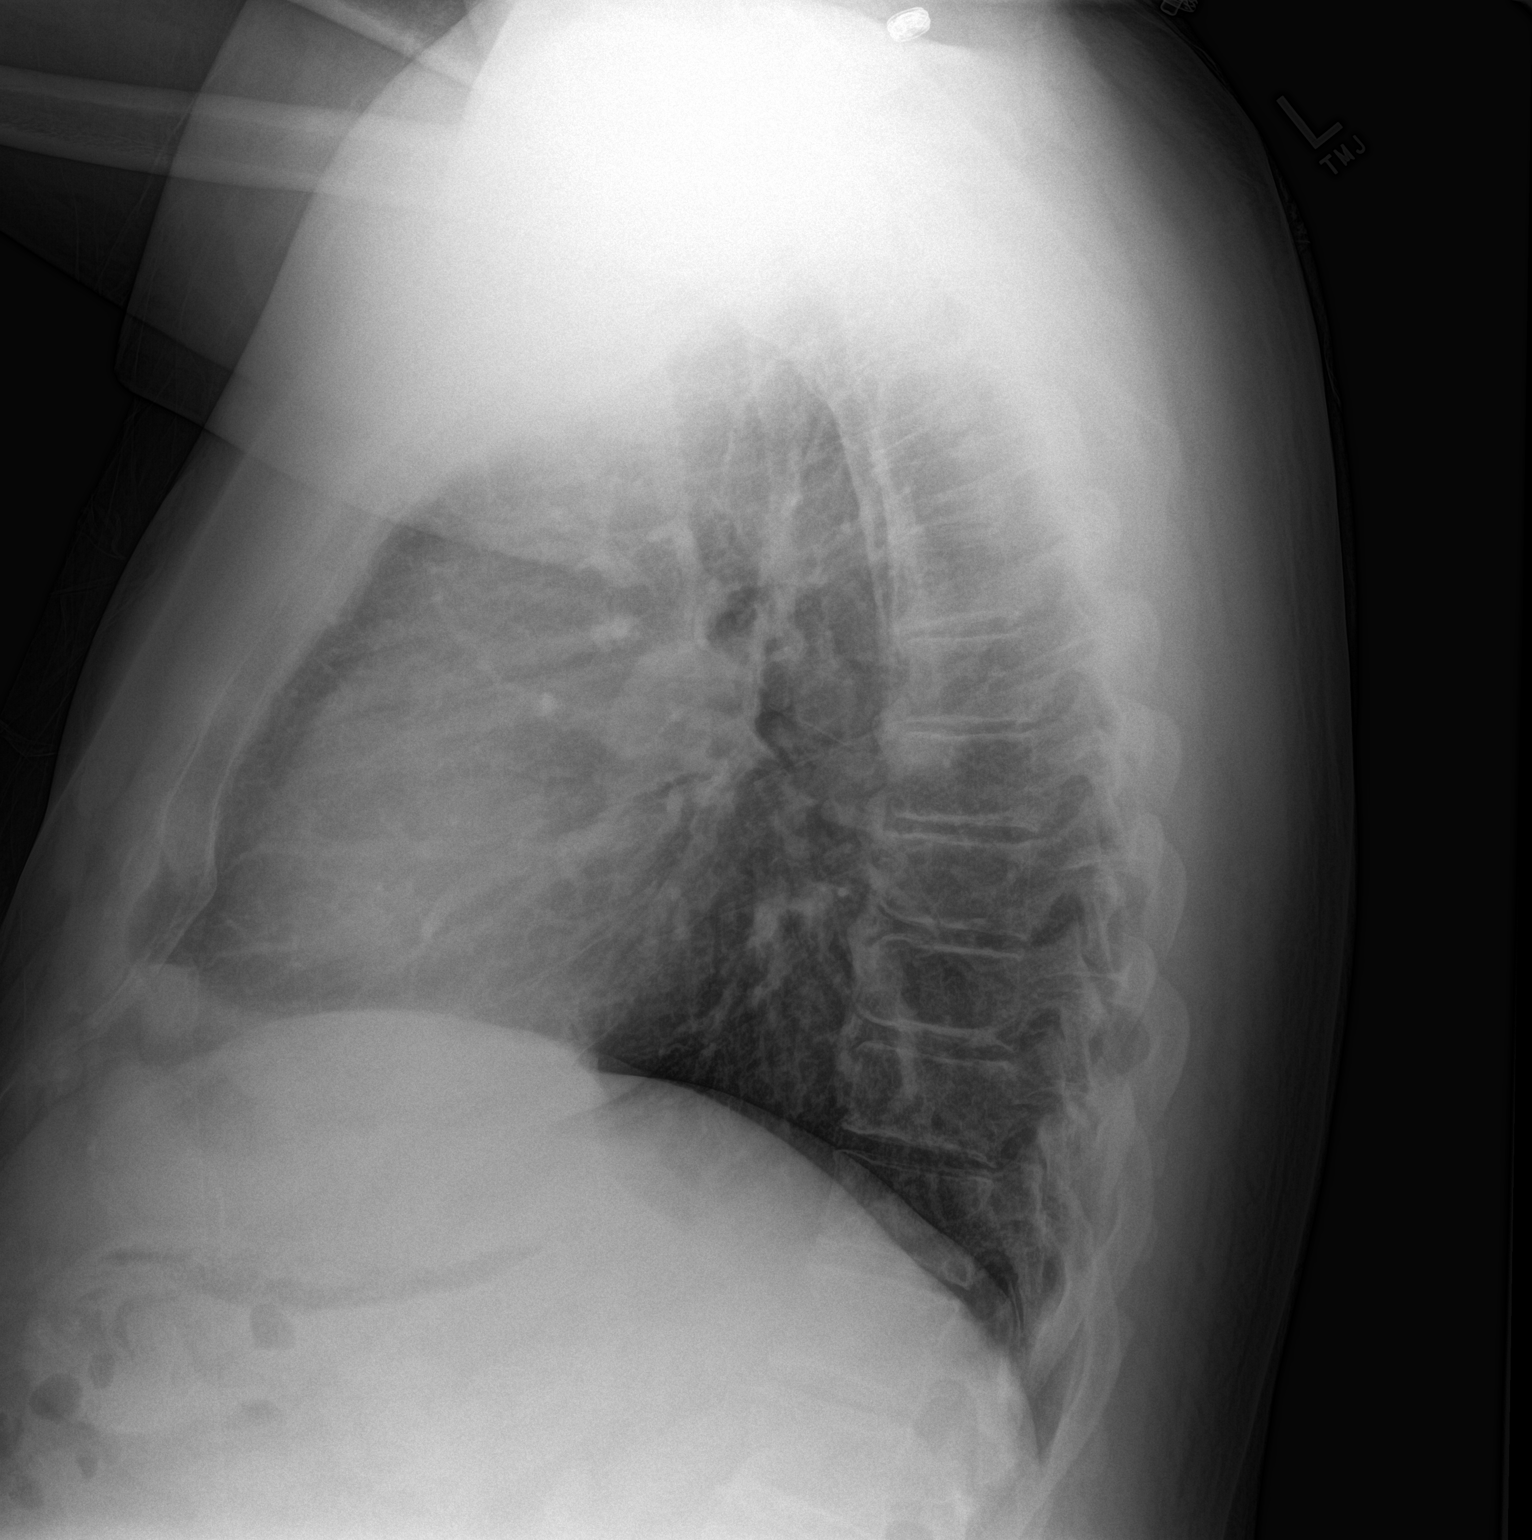

[2 of 2 positions shown; findings below may reference images not displayed]

FINDINGS: Stable lung volumes. Normal cardiac size and mediastinal contours.
Visualized tracheal air column is within normal limits. No
pneumothorax, pulmonary edema, pleural effusion or acute pulmonary
opacity. No acute osseous abnormality identified.
IMPRESSION: No acute cardiopulmonary abnormality.

## 2016-08-13 ENCOUNTER — Other Ambulatory Visit: Payer: Self-pay | Admitting: Internal Medicine

## 2016-10-12 ENCOUNTER — Other Ambulatory Visit: Payer: Self-pay | Admitting: Internal Medicine

## 2016-10-25 ENCOUNTER — Emergency Department (HOSPITAL_COMMUNITY)
Admission: EM | Admit: 2016-10-25 | Discharge: 2016-10-25 | Disposition: A | Discharge status: Home / Self Care | Payer: BLUE CROSS/BLUE SHIELD | Attending: Emergency Medicine | Admitting: Emergency Medicine

## 2016-10-25 ENCOUNTER — Encounter (HOSPITAL_COMMUNITY): Payer: Self-pay

## 2016-10-25 DIAGNOSIS — N189 Chronic kidney disease, unspecified: Secondary | ICD-10-CM | POA: Insufficient documentation

## 2016-10-25 DIAGNOSIS — M10061 Idiopathic gout, right knee: Secondary | ICD-10-CM | POA: Insufficient documentation

## 2016-10-25 DIAGNOSIS — M109 Gout, unspecified: Secondary | ICD-10-CM

## 2016-10-25 DIAGNOSIS — Z87891 Personal history of nicotine dependence: Secondary | ICD-10-CM | POA: Insufficient documentation

## 2016-10-25 DIAGNOSIS — Z79899 Other long term (current) drug therapy: Secondary | ICD-10-CM | POA: Insufficient documentation

## 2016-10-25 DIAGNOSIS — I2510 Atherosclerotic heart disease of native coronary artery without angina pectoris: Secondary | ICD-10-CM | POA: Insufficient documentation

## 2016-10-25 MED ORDER — INDOMETHACIN 25 MG PO CAPS: 25.0000 mg | ORAL_CAPSULE | Freq: Three times a day (TID) | ORAL | 0 refills | Status: DC | PRN

## 2016-10-25 MED ORDER — HYDROCODONE-ACETAMINOPHEN 5-325 MG PO TABS: 1.0000 | ORAL_TABLET | ORAL | 0 refills | Status: DC | PRN

## 2016-10-25 MED ORDER — DEXAMETHASONE SODIUM PHOSPHATE 10 MG/ML IJ SOLN
10.0000 mg | Freq: Once | INTRAMUSCULAR | Status: AC
  Administered 2016-10-25: 10 mg via INTRAMUSCULAR
  Filled 2016-10-25: qty 1

## 2016-10-25 MED ORDER — HYDROCODONE-ACETAMINOPHEN 5-325 MG PO TABS
1.0000 | ORAL_TABLET | Freq: Once | ORAL | Status: AC
  Administered 2016-10-25: 1 via ORAL
  Filled 2016-10-25: qty 1

## 2016-10-25 NOTE — Discharge Instructions (Signed)
You may take the hydrocodone prescribed for pain relief.  This will make you drowsy - do not drive within 4 hours of taking this medication. ° °

## 2016-10-25 NOTE — ED Triage Notes (Signed)
Right knee pain with a history of gout.  Started bothering me 3 days ago.  When I elevated it at night it starts feeling better, but when I get up and start moving around it starts bothering me again.

## 2016-10-26 NOTE — ED Provider Notes (Signed)
[facility] Provider Note   CSN: [id number] Arrival date & time: 10/25/16  1930     History   Chief Complaint Chief Complaint  Patient presents with  . Knee Pain    HPI John Wallace is a 56 y.o. male presenting with a 3 day history of of right knee pain and swelling and is consistent with a gout flare.  He had 3 indocin tablets, took 2 ytd and one this am without resolution of pain yet, but this medicine typically is the best for his gout.  He denies injury, fevers, chills, and when he recognized that even placing his bedsheet over the knee caused increased pain that this was a gout attack.  He denies other complaint.  He has used ice and heat without improvement. Elevation and rest improves his pain.  The history is provided by the patient.    Past Medical History:  Diagnosis Date  . Chronic kidney disease   . Coronary artery disease   . Diverticulosis   . GERD (gastroesophageal reflux disease)    no medical treatment  . Gout   . Myocardial infarction    mild Heart attack 2002  . Obesity   . Renal stone    passed   . Seizures (HCC)    as a child    Patient Active Problem List   Diagnosis Date Noted  . Abdominal pain 09/17/2015  . Constipation 09/17/2015  . Mucosal abnormality of stomach   . Hiatal hernia   . Dysphagia   . Mucosal abnormality of colon   . GERD (gastroesophageal reflux disease) 07/22/2015  . Esophageal dysphagia 07/22/2015  . Fatty liver 07/22/2015  . Family history of colonic polyps 07/22/2015  . Encounter for screening colonoscopy 07/22/2015  . Abdominal pain, epigastric 07/22/2015  . ANKLE PAIN, RIGHT 03/19/2008    Past Surgical History:  Procedure Laterality Date  . APPENDECTOMY    . BACK SURGERY     x5 surgeries  . BIOPSY N/A 08/07/2015   Procedure: Gastric Biopsies;  Surgeon: Corbin Ade, MD;  Location: [facility];  Service: Endoscopy;  Laterality: N/A;  . CARDIAC CATHETERIZATION    . COLONOSCOPY  2003   RMR: Normal  appearing colonic mucosa to 35-40 cm. Spasm versus persistent swelling and noncompliance of the colon along with patient's discomfort precluded completion of exam.  . COLONOSCOPY WITH PROPOFOL N/A 08/07/2015   RMR: Status post segmental colonic resection. Focally abnormal appeearing colonic mucosa of doubtful clinical significance status post biopsy.   . ESOPHAGEAL DILATION N/A 08/07/2015   Procedure: ESOPHAGEAL DILATION;  Surgeon: Corbin Ade, MD;  Location: [facility];  Service: Endoscopy;  Laterality: N/A;  #56 maloney dilator  . ESOPHAGOGASTRODUODENOSCOPY (EGD) WITH PROPOFOL N/A 08/07/2015   RMR: Normal esophagus- status post maloney dilation. Hiatal hernia. Abnormal stomach as described status post biopsy.   Marland Kitchen PARTIAL COLECTOMY     Dr Bradford:diverticulosis  . throat biop     benign       Home Medications    Prior to Admission medications   Medication Sig Start Date End Date Taking? Authorizing Provider  ibuprofen (ADVIL,MOTRIN) 200 MG tablet Take 200 mg by mouth every 6 (six) hours as needed for mild pain or moderate pain.   Yes Historical Provider, MD  pantoprazole (PROTONIX) 40 MG tablet Take 1 tablet by mouth daily. 10/12/16  Yes Historical Provider, MD  allopurinol (ZYLOPRIM) 300 MG tablet Take 1 tablet (300 mg total) by mouth daily. Patient not taking: Reported on  10/25/2016 04/21/16   Jacalyn Lefevre, MD  HYDROcodone-acetaminophen (NORCO/VICODIN) 5-325 MG tablet Take 1 tablet by mouth every 4 (four) hours as needed for moderate pain. 10/25/16   Burgess Amor, PA-C  indomethacin (INDOCIN) 25 MG capsule Take 1 capsule (25 mg total) by mouth 3 (three) times daily as needed. 10/25/16   Burgess Amor, PA-C    Family History Family History  Problem Relation Age of Onset  . Other      doesn't know family history    Social History Social History  Substance Use Topics  . Smoking status: Never Smoker  . Smokeless tobacco: Former Neurosurgeon    Types: Chew  . Alcohol use No     Comment: none  since 2013, not regular drinker before but would occasional binging     Allergies   Patient has no known allergies.   Review of Systems Review of Systems  Constitutional: Negative for fever.  Musculoskeletal: Positive for arthralgias and joint swelling. Negative for myalgias.  Neurological: Negative for weakness and numbness.     Physical Exam Updated Vital Signs BP 141/87 (BP Location: Right Arm)   Pulse 66   Temp 97.8 F (36.6 C) (Oral)   Resp 18   Ht 5\' 8"  (1.727 m)   Wt 106.6 kg   SpO2 96%   BMI 35.73 kg/m   Physical Exam  Constitutional: He appears well-developed and well-nourished.  HENT:  Head: Atraumatic.  Neck: Normal range of motion.  Cardiovascular:  Pulses equal bilaterally  Musculoskeletal: He exhibits tenderness.       Right knee: He exhibits decreased range of motion and swelling. He exhibits no effusion, no ecchymosis, no deformity and no erythema. Tenderness found.  Neurological: He is alert. He has normal strength. He displays normal reflexes. No sensory deficit.  Skin: Skin is warm and dry.  Psychiatric: He has a normal mood and affect.     ED Treatments / Results  Labs (all labs ordered are listed, but only abnormal results are displayed) Labs Reviewed - No data to display  EKG  EKG Interpretation None       Radiology No results found.  Procedures Procedures (including critical care time)  Medications Ordered in ED Medications  dexamethasone (DECADRON) injection 10 mg (10 mg Intramuscular Given 10/25/16 2101)  HYDROcodone-acetaminophen (NORCO/VICODIN) 5-325 MG per tablet 1 tablet (1 tablet Oral Given 10/25/16 2100)     Initial Impression / Assessment and Plan / ED Course  I have reviewed the triage vital signs and the nursing notes.  Pertinent labs & imaging results that were available during my care of the patient were reviewed by me and considered in my medical decision making (see chart for details).  Clinical Course      Acute on chronic gout. No exam findings to suggest septic joint. Indocin, hydrocodone.  Pt also given IM injection of decadron prior to dc home. Advised rest, warm compresses, recheck for any new or persistent sx.  The patient appears reasonably screened and/or stabilized for discharge and I doubt any other medical condition or other [facility] requiring further screening, evaluation, or treatment in the ED at this time prior to discharge.   Final Clinical Impressions(s) / ED Diagnoses   Final diagnoses:  Acute gout of right knee, unspecified cause    New Prescriptions Discharge Medication List as of 10/25/2016  8:37 PM    START taking these medications   Details  HYDROcodone-acetaminophen (NORCO/VICODIN) 5-325 MG tablet Take 1 tablet by mouth every 4 (four) hours  as needed for moderate pain., Starting Mon 10/25/2016, Print         Burgess AmorJulie Zabria Liss, PA-C 10/26/16 1414    Raeford RazorStephen Kohut, MD 11/01/16 1438

## 2017-01-27 ENCOUNTER — Encounter (HOSPITAL_COMMUNITY): Payer: Self-pay | Admitting: Emergency Medicine

## 2017-01-27 ENCOUNTER — Emergency Department (HOSPITAL_COMMUNITY)
Admission: EM | Admit: 2017-01-27 | Discharge: 2017-01-28 | Disposition: A | Discharge status: Home / Self Care | Payer: BLUE CROSS/BLUE SHIELD | Attending: Emergency Medicine | Admitting: Emergency Medicine

## 2017-01-27 DIAGNOSIS — N189 Chronic kidney disease, unspecified: Secondary | ICD-10-CM | POA: Insufficient documentation

## 2017-01-27 DIAGNOSIS — M10061 Idiopathic gout, right knee: Secondary | ICD-10-CM | POA: Insufficient documentation

## 2017-01-27 DIAGNOSIS — Z87891 Personal history of nicotine dependence: Secondary | ICD-10-CM | POA: Insufficient documentation

## 2017-01-27 DIAGNOSIS — I2510 Atherosclerotic heart disease of native coronary artery without angina pectoris: Secondary | ICD-10-CM | POA: Insufficient documentation

## 2017-01-27 NOTE — ED Triage Notes (Signed)
Pt states that he has a history of gout and is having a flare up in the right knee.  Was taking Indocin at home but has run out and is still having pain

## 2017-01-28 MED ORDER — INDOMETHACIN 25 MG PO CAPS: 25.0000 mg | ORAL_CAPSULE | Freq: Three times a day (TID) | ORAL | 1 refills | Status: DC | PRN

## 2017-01-28 MED ORDER — HYDROCODONE-ACETAMINOPHEN 5-325 MG PO TABS: 1.0000 | ORAL_TABLET | ORAL | 0 refills | Status: DC | PRN

## 2017-01-28 MED ORDER — INDOMETHACIN 25 MG PO CAPS
50.0000 mg | ORAL_CAPSULE | Freq: Once | ORAL | Status: AC
  Administered 2017-01-28: 50 mg via ORAL
  Filled 2017-01-28: qty 2

## 2017-01-28 MED ORDER — HYDROCODONE-ACETAMINOPHEN 5-325 MG PO TABS
1.0000 | ORAL_TABLET | Freq: Once | ORAL | Status: AC
  Administered 2017-01-28: 1 via ORAL
  Filled 2017-01-28: qty 1

## 2017-01-28 MED ORDER — DEXAMETHASONE SODIUM PHOSPHATE 10 MG/ML IJ SOLN
10.0000 mg | Freq: Once | INTRAMUSCULAR | Status: AC
  Administered 2017-01-28: 10 mg via INTRAMUSCULAR
  Filled 2017-01-28: qty 1

## 2017-01-28 NOTE — Discharge Instructions (Signed)
Do not drive within 4 hours of taking hydrocodone as discussed.  Take your next dose of indocin tomorrow morning.

## 2017-01-29 NOTE — ED Provider Notes (Signed)
[facility] Provider Note   CSN: [id number] Arrival date & time: 01/27/17  2204     History   Chief Complaint Chief Complaint  Patient presents with  . Knee Pain    HPI John Wallace is a 56 y.o. male presenting with his typical gouty flare of his right knee.  His symptoms started 2 days ago.  Indocin tid is the only medicine that has historically relieves his sx, he had 5 tablets left over from his last refill so used bid x 2 days, last dose this am with inadequate improvement in pain and swelling.  He denies injury of the joint and denies fevers, chills or radiation of pain. Pain is worsened with weight bearing or when his pants leg simply brushes the knee (presents wearing shorts).  He has used ice additionally with mild improvement in swelling.  The history is provided by the patient.    Past Medical History:  Diagnosis Date  . Chronic kidney disease   . Coronary artery disease   . Diverticulosis   . GERD (gastroesophageal reflux disease)    no medical treatment  . Gout   . Myocardial infarction (HCC)    mild Heart attack 2002  . Obesity   . Renal stone    passed   . Seizures (HCC)    as a child    Patient Active Problem List   Diagnosis Date Noted  . Abdominal pain 09/17/2015  . Constipation 09/17/2015  . Mucosal abnormality of stomach   . Hiatal hernia   . Dysphagia   . Mucosal abnormality of colon   . GERD (gastroesophageal reflux disease) 07/22/2015  . Esophageal dysphagia 07/22/2015  . Fatty liver 07/22/2015  . Family history of colonic polyps 07/22/2015  . Encounter for screening colonoscopy 07/22/2015  . Abdominal pain, epigastric 07/22/2015  . ANKLE PAIN, RIGHT 03/19/2008    Past Surgical History:  Procedure Laterality Date  . APPENDECTOMY    . BACK SURGERY     x5 surgeries  . BIOPSY N/A 08/07/2015   Procedure: Gastric Biopsies;  Surgeon: Corbin Ade, MD;  Location: [facility];  Service: Endoscopy;  Laterality: N/A;  . CARDIAC  CATHETERIZATION    . COLONOSCOPY  2003   RMR: Normal appearing colonic mucosa to 35-40 cm. Spasm versus persistent swelling and noncompliance of the colon along with patient's discomfort precluded completion of exam.  . COLONOSCOPY WITH PROPOFOL N/A 08/07/2015   RMR: Status post segmental colonic resection. Focally abnormal appeearing colonic mucosa of doubtful clinical significance status post biopsy.   . ESOPHAGEAL DILATION N/A 08/07/2015   Procedure: ESOPHAGEAL DILATION;  Surgeon: Corbin Ade, MD;  Location: [facility];  Service: Endoscopy;  Laterality: N/A;  #56 maloney dilator  . ESOPHAGOGASTRODUODENOSCOPY (EGD) WITH PROPOFOL N/A 08/07/2015   RMR: Normal esophagus- status post maloney dilation. Hiatal hernia. Abnormal stomach as described status post biopsy.   Marland Kitchen PARTIAL COLECTOMY     Dr Bradford:diverticulosis  . throat biop     benign       Home Medications    Prior to Admission medications   Medication Sig Start Date End Date Taking? Authorizing Provider  allopurinol (ZYLOPRIM) 300 MG tablet Take 1 tablet (300 mg total) by mouth daily. Patient not taking: Reported on 10/25/2016 04/21/16   Jacalyn Lefevre, MD  HYDROcodone-acetaminophen (NORCO/VICODIN) 5-325 MG tablet Take 1 tablet by mouth every 4 (four) hours as needed. 01/28/17   Burgess Amor, PA-C  ibuprofen (ADVIL,MOTRIN) 200 MG tablet Take 200 mg by  mouth every 6 (six) hours as needed for mild pain or moderate pain.    Historical Provider, MD  indomethacin (INDOCIN) 25 MG capsule Take 1 capsule (25 mg total) by mouth 3 (three) times daily as needed. 01/28/17   Burgess Amor, PA-C  pantoprazole (PROTONIX) 40 MG tablet Take 1 tablet by mouth daily. 10/12/16   Historical Provider, MD    Family History Family History  Problem Relation Age of Onset  . Other      doesn't know family history    Social History Social History  Substance Use Topics  . Smoking status: Never Smoker  . Smokeless tobacco: Former Neurosurgeon    Types: Chew  .  Alcohol use No     Comment: none since 2013, not regular drinker before but would occasional binging     Allergies   Patient has no known allergies.   Review of Systems Review of Systems  Constitutional: Negative for fever.  Musculoskeletal: Positive for arthralgias and joint swelling. Negative for myalgias.  Skin: Negative for color change.  Neurological: Negative for weakness and numbness.     Physical Exam Updated Vital Signs BP (!) 141/91 (BP Location: Right Arm)   Pulse 60   Temp 98.3 F (36.8 C) (Oral)   Resp 18   Ht  (1.727 m)   Wt 109.8 kg   SpO2 97%   BMI 36.80 kg/m   Physical Exam  Constitutional: He appears well-developed and well-nourished.  HENT:  Head: Atraumatic.  Neck: Normal range of motion.  Cardiovascular:  Pulses:      Dorsalis pedis pulses are 2+ on the right side, and 2+ on the left side.  Pulses equal bilaterally  Musculoskeletal: He exhibits edema and tenderness.       Right knee: He exhibits swelling. He exhibits no effusion, no deformity and no erythema. Tenderness found.  ttp anterior, medial and lateral right knee. Edema without erythema noted.  Pain with light touch. Skin intact. Calf and thigh soft and nontender.    Neurological: He is alert. He has normal strength. He displays normal reflexes. No sensory deficit.  Skin: Skin is warm and dry.  Psychiatric: He has a normal mood and affect.     ED Treatments / Results  Labs (all labs ordered are listed, but only abnormal results are displayed) Labs Reviewed - No data to display  EKG  EKG Interpretation None       Radiology No results found.  Procedures Procedures (including critical care time)  Medications Ordered in ED Medications  dexamethasone (DECADRON) injection 10 mg (10 mg Intramuscular Given 01/28/17 0059)  indomethacin (INDOCIN) capsule 50 mg (50 mg Oral Given 01/28/17 0058)  HYDROcodone-acetaminophen (NORCO/VICODIN) 5-325 MG per tablet 1 tablet (1 tablet  Oral Given 01/28/17 0058)     Initial Impression / Assessment and Plan / ED Course  I have reviewed the triage vital signs and the nursing notes.  Pertinent labs & imaging results that were available during my care of the patient were reviewed by me and considered in my medical decision making (see chart for details).     Pt with his typical gout flare. No risk or concerns per exam for joint infection. No hx trauma.  Decadron IM x 1, then indocin and hydrocodone prescribed.  Pt also endorses insurance issues preventing pcp f/u.  Referral to [facility] given.   Final Clinical Impressions(s) / ED Diagnoses   Final diagnoses:  Acute idiopathic gout of right knee    New  Prescriptions Discharge Medication List as of 01/28/2017  1:01 AM       Burgess Amor, PA-C 01/29/17 1429    Benjiman Core, MD 01/29/17 2111

## 2017-02-10 ENCOUNTER — Ambulatory Visit (INDEPENDENT_AMBULATORY_CARE_PROVIDER_SITE_OTHER): Payer: Self-pay | Admitting: Family Medicine

## 2017-02-10 ENCOUNTER — Encounter: Payer: Self-pay | Admitting: Family Medicine

## 2017-02-10 VITALS — BP 143/84 | HR 77 | Temp 97.0°F | Ht 68.0 in | Wt 252.0 lb

## 2017-02-10 DIAGNOSIS — Z0000 Encounter for general adult medical examination without abnormal findings: Secondary | ICD-10-CM

## 2017-02-10 DIAGNOSIS — M10061 Idiopathic gout, right knee: Secondary | ICD-10-CM

## 2017-02-10 DIAGNOSIS — K219 Gastro-esophageal reflux disease without esophagitis: Secondary | ICD-10-CM

## 2017-02-10 DIAGNOSIS — M109 Gout, unspecified: Secondary | ICD-10-CM | POA: Insufficient documentation

## 2017-02-10 DIAGNOSIS — K449 Diaphragmatic hernia without obstruction or gangrene: Secondary | ICD-10-CM

## 2017-02-10 MED ORDER — PANTOPRAZOLE SODIUM 40 MG PO TBEC: 40.0000 mg | DELAYED_RELEASE_TABLET | Freq: Every day | ORAL | 5 refills | Status: DC

## 2017-02-10 MED ORDER — INDOMETHACIN 25 MG PO CAPS: 25.0000 mg | ORAL_CAPSULE | Freq: Three times a day (TID) | ORAL | 1 refills | Status: DC | PRN

## 2017-02-10 MED ORDER — FEBUXOSTAT 80 MG PO TABS: 80.0000 mg | ORAL_TABLET | Freq: Every day | ORAL | 5 refills | Status: DC

## 2017-02-10 NOTE — Progress Notes (Signed)
Subjective:  Patient ID: John Wallace, male    DOB: 31-Jan-1961  Age: 56 y.o. MRN: [id number]  CC: New Patient (Initial Visit) (pt here today to establish care and is also having a gout flare up)   HPI John Wallace presents for Well exam. Reports pain at right knee. Improving currently. Onset of current bout 1 week ago. Previously dx with gout & hyperuricemia. Responding to indomethacin. Allopurinol ineffective for prevention. Colchicine ineffective for relief. Reflux symptoms chronically. Caused by hiatal hernia.  Controlled well with PPI.   History John Wallace has a past medical history of Chronic kidney disease; Coronary artery disease; Diverticulosis; GERD (gastroesophageal reflux disease); Gout; Myocardial infarction ([place]); Obesity; Renal stone; and Seizures ([place]).   He has a past surgical history that includes Partial colectomy; Cardiac catheterization; throat biop; Appendectomy; Back surgery; Colonoscopy (2003); Colonoscopy with propofol (N/A, 08/07/2015); Esophagogastroduodenoscopy (egd) with propofol (N/A, 08/07/2015); Esophageal dilation (N/A, 08/07/2015); and biopsy (N/A, 08/07/2015).   His family history is not on file.He reports that he has never smoked. He has quit using smokeless tobacco. His smokeless tobacco use included Chew. He reports that he does not drink alcohol or use drugs.  Current Outpatient Prescriptions on File Prior to Visit  Medication Sig Dispense Refill  . HYDROcodone-acetaminophen (NORCO/VICODIN) 5-325 MG tablet Take 1 tablet by mouth every 4 (four) hours as needed. 15 tablet 0  . ibuprofen (ADVIL,MOTRIN) 200 MG tablet Take 200 mg by mouth every 6 (six) hours as needed for mild pain or moderate pain.     No current facility-administered medications on file prior to visit.     ROS Review of Systems  Constitutional: Negative for activity change, appetite change, chills, diaphoresis, fatigue, fever and unexpected weight change.  HENT: Negative for  congestion, ear pain, hearing loss, postnasal drip, rhinorrhea, sore throat, tinnitus and trouble swallowing.   Eyes: Negative for photophobia, pain, discharge and redness.  Respiratory: Negative for apnea, cough, choking, chest tightness, shortness of breath, wheezing and stridor.   Cardiovascular: Negative for chest pain, palpitations and leg swelling.  Gastrointestinal: Positive for abdominal pain. Negative for abdominal distention, blood in stool, constipation, diarrhea, nausea and vomiting.  Endocrine: Negative for cold intolerance, heat intolerance, polydipsia, polyphagia and polyuria.  Genitourinary: Negative for difficulty urinating, dysuria, enuresis, flank pain, frequency, genital sores, hematuria and urgency.  Musculoskeletal: Positive for arthralgias. Negative for joint swelling.  Skin: Negative for color change, rash and wound.  Allergic/Immunologic: Negative for immunocompromised state.  Neurological: Negative for dizziness, tremors, seizures, syncope, facial asymmetry, speech difficulty, weakness, light-headedness, numbness and headaches.  Hematological: Does not bruise/bleed easily.  Psychiatric/Behavioral: Negative for agitation, behavioral problems, confusion, decreased concentration, dysphoric mood, hallucinations, sleep disturbance and suicidal ideas. The patient is not nervous/anxious and is not hyperactive.     Objective:  BP (!) 143/84   Pulse 77   Temp 97 F (36.1 C) (Oral)   Ht 5' 8" (1.727 m)   Wt 252 lb (114.3 kg)   BMI 38.32 kg/m   Physical Exam  Constitutional: He is oriented to person, place, and time. He appears well-developed and well-nourished.  HENT:  Head: Normocephalic and atraumatic.  Mouth/Throat: Oropharynx is clear and moist.  Eyes: EOM are normal. Pupils are equal, round, and reactive to light.  Neck: Normal range of motion. No tracheal deviation present. No thyromegaly present.  Cardiovascular: Normal rate, regular rhythm and normal heart  sounds.  Exam reveals no gallop and no friction rub.   No murmur heard. Pulmonary/Chest: Breath sounds  normal. He has no wheezes. He has no rales.  Abdominal: Soft. He exhibits no mass. There is no tenderness.  Musculoskeletal: Normal range of motion. He exhibits no edema.  Neurological: He is alert and oriented to person, place, and time.  Skin: Skin is warm and dry.  Psychiatric: He has a normal mood and affect.    Assessment & Plan:   [place] was seen today for new patient (initial visit).  Diagnoses and all orders for this visit:  Gastroesophageal reflux disease, esophagitis presence not specified -     CBC with Differential/Platelet -     CMP14+EGFR  Hiatal hernia -     CBC with Differential/Platelet -     CMP14+EGFR  Acute idiopathic gout of right knee -     CBC with Differential/Platelet -     CMP14+EGFR -     Uric acid  Well adult exam -     Lipid panel -     PSA Total (Reflex To Free) -     Urinalysis  Other orders -     indomethacin (INDOCIN) 25 MG capsule; Take 1 capsule (25 mg total) by mouth 3 (three) times daily as needed. -     pantoprazole (PROTONIX) 40 MG tablet; Take 1 tablet (40 mg total) by mouth daily. -     Febuxostat 80 MG TABS; Take 1 tablet (80 mg total) by mouth daily. For gout prevention   I have discontinued John Wallace's allopurinol. I have also changed his pantoprazole. Additionally, I am having him start on Febuxostat. Lastly, I am having him maintain his ibuprofen, HYDROcodone-acetaminophen, and indomethacin. We will stop administering gentamicin (GARAMYCIN) 440 mg in dextrose 5 % 100 mL IVPB.  Meds ordered this encounter  Medications  . indomethacin (INDOCIN) 25 MG capsule    Sig: Take 1 capsule (25 mg total) by mouth 3 (three) times daily as needed.    Dispense:  30 capsule    Refill:  1  . pantoprazole (PROTONIX) 40 MG tablet    Sig: Take 1 tablet (40 mg total) by mouth daily.    Dispense:  30 tablet    Refill:  5  . Febuxostat 80  MG TABS    Sig: Take 1 tablet (80 mg total) by mouth daily. For gout prevention    Dispense:  30 tablet    Refill:  5     Follow-up: Return in about 6 months (around 08/13/2017).  Claretta Fraise, M.D.

## 2017-02-16 ENCOUNTER — Other Ambulatory Visit: Payer: Self-pay

## 2017-02-16 LAB — URINALYSIS
Bilirubin, UA: NEGATIVE
Glucose, UA: NEGATIVE
KETONES UA: NEGATIVE
NITRITE UA: NEGATIVE
Protein, UA: NEGATIVE
RBC UA: NEGATIVE
SPEC GRAV UA: 1.01 (ref 1.005–1.030)
Urobilinogen, Ur: 0.2 mg/dL (ref 0.2–1.0)
pH, UA: 6 (ref 5.0–7.5)

## 2017-02-17 LAB — CBC WITH DIFFERENTIAL/PLATELET
BASOS: 1 %
Basophils Absolute: 0.1 10*3/uL (ref 0.0–0.2)
EOS (ABSOLUTE): 0.2 10*3/uL (ref 0.0–0.4)
EOS: 2 %
Hematocrit: 43.3 % (ref 37.5–51.0)
Hemoglobin: 14.4 g/dL (ref 13.0–17.7)
IMMATURE GRANS (ABS): 0 10*3/uL (ref 0.0–0.1)
IMMATURE GRANULOCYTES: 1 %
LYMPHS: 22 %
Lymphocytes Absolute: 1.7 10*3/uL (ref 0.7–3.1)
MCH: 28.2 pg (ref 26.6–33.0)
MCHC: 33.3 g/dL (ref 31.5–35.7)
MCV: 85 fL (ref 79–97)
Monocytes Absolute: 0.6 10*3/uL (ref 0.1–0.9)
Monocytes: 7 %
NEUTROS PCT: 67 %
Neutrophils Absolute: 5.3 10*3/uL (ref 1.4–7.0)
Platelets: 271 10*3/uL (ref 150–379)
RBC: 5.1 x10E6/uL (ref 4.14–5.80)
RDW: 13.4 % (ref 12.3–15.4)
WBC: 7.9 10*3/uL (ref 3.4–10.8)

## 2017-02-17 LAB — CMP14+EGFR
ALT: 28 IU/L (ref 0–44)
AST: 20 IU/L (ref 0–40)
Albumin/Globulin Ratio: 1.2 (ref 1.2–2.2)
Albumin: 3.8 g/dL (ref 3.5–5.5)
Alkaline Phosphatase: 82 IU/L (ref 39–117)
BUN/Creatinine Ratio: 15 (ref 9–20)
BUN: 13 mg/dL (ref 6–24)
Bilirubin Total: 0.3 mg/dL (ref 0.0–1.2)
CALCIUM: 9.2 mg/dL (ref 8.7–10.2)
CO2: 25 mmol/L (ref 18–29)
CREATININE: 0.86 mg/dL (ref 0.76–1.27)
Chloride: 96 mmol/L (ref 96–106)
GFR, EST AFRICAN AMERICAN: 112 mL/min/{1.73_m2} (ref >59)
GFR, EST NON AFRICAN AMERICAN: 97 mL/min/{1.73_m2} (ref >59)
GLUCOSE: 139 mg/dL — AB (ref 65–99)
Globulin, Total: 3.1 g/dL (ref 1.5–4.5)
POTASSIUM: 4.3 mmol/L (ref 3.5–5.2)
Sodium: 139 mmol/L (ref 134–144)
TOTAL PROTEIN: 6.9 g/dL (ref 6.0–8.5)

## 2017-02-17 LAB — LIPID PANEL
CHOL/HDL RATIO: 3.9 ratio (ref 0.0–5.0)
CHOLESTEROL TOTAL: 212 mg/dL — AB (ref 100–199)
HDL: 55 mg/dL (ref >39)
LDL Calculated: 134 mg/dL — ABNORMAL HIGH (ref 0–99)
TRIGLYCERIDES: 114 mg/dL (ref 0–149)
VLDL Cholesterol Cal: 23 mg/dL (ref 5–40)

## 2017-02-17 LAB — URIC ACID: Uric Acid: 6.5 mg/dL (ref 3.7–8.6)

## 2017-02-17 LAB — PSA TOTAL (REFLEX TO FREE): Prostate Specific Ag, Serum: 0.5 ng/mL (ref 0.0–4.0)

## 2017-03-17 ENCOUNTER — Telehealth: Payer: Self-pay | Admitting: *Deleted

## 2017-03-17 NOTE — Telephone Encounter (Signed)
Uloric is > than $300 Pt would like RX that is cheaper Please advise

## 2017-03-18 ENCOUNTER — Other Ambulatory Visit: Payer: Self-pay | Admitting: Family Medicine

## 2017-03-18 MED ORDER — PROBENECID 500 MG PO TABS: 500.0000 mg | ORAL_TABLET | Freq: Two times a day (BID) | ORAL | 5 refills | Status: DC

## 2017-03-18 NOTE — Telephone Encounter (Signed)
I sent in the requested prescription 

## 2017-03-18 NOTE — Telephone Encounter (Signed)
Pt notified of medication change.  

## 2017-03-25 ENCOUNTER — Other Ambulatory Visit: Payer: Self-pay | Admitting: Family Medicine

## 2017-03-25 ENCOUNTER — Telehealth: Payer: Self-pay | Admitting: *Deleted

## 2017-03-25 MED ORDER — ALLOPURINOL 300 MG PO TABS: 300.0000 mg | ORAL_TABLET | Freq: Every day | ORAL | 6 refills | Status: DC

## 2017-03-25 NOTE — Telephone Encounter (Signed)
Patient aware that medication has been sent to pharmacy 

## 2017-03-25 NOTE — Telephone Encounter (Signed)
Cant afford uloric, please change to allopurinol. [facility] [address]

## 2017-03-25 NOTE — Telephone Encounter (Signed)
Please contact the patient I assume he meant [facility] [address] [place]? That is where I sent it. Please reroute as needed

## 2017-07-19 ENCOUNTER — Other Ambulatory Visit: Payer: Self-pay | Admitting: Family Medicine

## 2017-08-16 ENCOUNTER — Ambulatory Visit: Payer: Self-pay | Admitting: Family Medicine

## 2017-08-27 ENCOUNTER — Other Ambulatory Visit: Payer: Self-pay

## 2017-08-27 ENCOUNTER — Encounter (HOSPITAL_COMMUNITY): Payer: Self-pay | Admitting: *Deleted

## 2017-08-27 ENCOUNTER — Emergency Department (HOSPITAL_COMMUNITY)
Admission: EM | Admit: 2017-08-27 | Discharge: 2017-08-28 | Disposition: A | Discharge status: Home / Self Care | Payer: Self-pay | Attending: Emergency Medicine | Admitting: Emergency Medicine

## 2017-08-27 DIAGNOSIS — I2510 Atherosclerotic heart disease of native coronary artery without angina pectoris: Secondary | ICD-10-CM | POA: Insufficient documentation

## 2017-08-27 DIAGNOSIS — N189 Chronic kidney disease, unspecified: Secondary | ICD-10-CM | POA: Insufficient documentation

## 2017-08-27 DIAGNOSIS — Z87891 Personal history of nicotine dependence: Secondary | ICD-10-CM | POA: Insufficient documentation

## 2017-08-27 DIAGNOSIS — M1000 Idiopathic gout, unspecified site: Secondary | ICD-10-CM | POA: Insufficient documentation

## 2017-08-27 DIAGNOSIS — Z79899 Other long term (current) drug therapy: Secondary | ICD-10-CM | POA: Insufficient documentation

## 2017-08-27 NOTE — ED Triage Notes (Signed)
Pt c/o pain and swelling to right foot and ankle, has hx of gout and reports that this feels the same, denies any injury,

## 2017-08-28 MED ORDER — INDOMETHACIN 25 MG PO CAPS: 25.0000 mg | ORAL_CAPSULE | Freq: Three times a day (TID) | ORAL | 0 refills | Status: DC | PRN

## 2017-08-28 MED ORDER — HYDROCODONE-ACETAMINOPHEN 5-325 MG PO TABS
2.0000 | ORAL_TABLET | Freq: Once | ORAL | Status: AC
  Administered 2017-08-28: 2 via ORAL
  Filled 2017-08-28: qty 2

## 2017-08-28 MED ORDER — INDOMETHACIN 25 MG PO CAPS
25.0000 mg | ORAL_CAPSULE | Freq: Once | ORAL | Status: AC
Start: 2017-08-28 — End: 2017-08-28
  Administered 2017-08-28: 25 mg via ORAL
  Filled 2017-08-28: qty 1

## 2017-08-28 MED ORDER — DEXAMETHASONE SODIUM PHOSPHATE 10 MG/ML IJ SOLN
10.0000 mg | Freq: Once | INTRAMUSCULAR | Status: AC
  Administered 2017-08-28: 10 mg via INTRAMUSCULAR
  Filled 2017-08-28: qty 1

## 2017-08-28 MED ORDER — HYDROCODONE-ACETAMINOPHEN 5-325 MG PO TABS: 1.0000 | ORAL_TABLET | ORAL | 0 refills | Status: DC | PRN

## 2017-08-28 MED ORDER — PROMETHAZINE HCL 12.5 MG PO TABS
12.5000 mg | ORAL_TABLET | Freq: Once | ORAL | Status: AC
  Administered 2017-08-28: 12.5 mg via ORAL
  Filled 2017-08-28: qty 1

## 2017-08-28 MED ORDER — DEXAMETHASONE 4 MG PO TABS: 4.0000 mg | ORAL_TABLET | Freq: Two times a day (BID) | ORAL | 0 refills | Status: DC

## 2017-08-28 NOTE — ED Provider Notes (Signed)
[facility] EMERGENCY DEPARTMENT Provider Note   CSN: [id number] Arrival date & time: 08/27/17  2311     History   Chief Complaint Chief Complaint  Patient presents with  . Foot Pain    HPI John Wallace is a 56 y.o. male.   Ankle Pain   The incident occurred 3 to 5 hours ago. The incident occurred at work. There was no injury mechanism. The pain is present in the right ankle. The quality of the pain is described as aching, burning and throbbing. The pain is severe. The pain has been fluctuating since onset. Associated symptoms include inability to bear weight. The symptoms are aggravated by bearing weight. He has tried NSAIDs for the symptoms.    Past Medical History:  Diagnosis Date  . Chronic kidney disease   . Coronary artery disease   . Diverticulosis   . GERD (gastroesophageal reflux disease)    no medical treatment  . Gout   . Myocardial infarction ([facility])    mild Heart attack 2002  . Obesity   . Renal stone    passed   . Seizures ([facility])    as a child    Patient Active Problem List   Diagnosis Date Noted  . Gout 02/10/2017  . Abdominal pain 09/17/2015  . Constipation 09/17/2015  . Mucosal abnormality of stomach   . Hiatal hernia   . Dysphagia   . Mucosal abnormality of colon   . GERD (gastroesophageal reflux disease) 07/22/2015  . Esophageal dysphagia 07/22/2015  . Fatty liver 07/22/2015  . Family history of colonic polyps 07/22/2015  . ANKLE PAIN, RIGHT 03/19/2008    Past Surgical History:  Procedure Laterality Date  . APPENDECTOMY    . BACK SURGERY     x5 surgeries  . CARDIAC CATHETERIZATION    . COLONOSCOPY  2003   RMR: Normal appearing colonic mucosa to 35-40 cm. Spasm versus persistent swelling and noncompliance of the colon along with patient's discomfort precluded completion of exam.  . COLONOSCOPY WITH PROPOFOL N/A 08/07/2015   Performed by Corbin Adeourk, Arkel M, MD at [facility]  . ESOPHAGEAL DILATION N/A 08/07/2015   Performed by Corbin Adeourk,  Mary M, MD at [facility]  . ESOPHAGOGASTRODUODENOSCOPY (EGD) WITH PROPOFOL N/A 08/07/2015   Performed by Corbin Adeourk, Pistol M, MD at [facility]  . Gastric Biopsies N/A 08/07/2015   Performed by Corbin Adeourk, Lucio M, MD at [facility]  . PARTIAL COLECTOMY     Dr Bradford:diverticulosis  . throat biop     benign       Home Medications    Prior to Admission medications   Medication Sig Start Date End Date Taking? Authorizing Provider  allopurinol (ZYLOPRIM) 300 MG tablet Take 1 tablet (300 mg total) by mouth daily. For gout prevention 03/25/17  Yes Stacks, Broadus JohnWarren, MD  HYDROcodone-acetaminophen (NORCO/VICODIN) 5-325 MG tablet Take 1 tablet by mouth every 4 (four) hours as needed. 01/28/17  Yes Idol, Raynelle FanningJulie, PA-C  ibuprofen (ADVIL,MOTRIN) 200 MG tablet Take 200 mg by mouth every 6 (six) hours as needed for mild pain or moderate pain.   Yes [provider]  indomethacin (INDOCIN) 25 MG capsule TAKE ONE CAPSULE BY MOUTH THREE TIMES A DAY AS NEEDED 07/20/17  Yes Stacks, Broadus JohnWarren, MD  pantoprazole (PROTONIX) 40 MG tablet Take 1 tablet (40 mg total) by mouth daily. 02/10/17  Yes Mechele ClaudeStacks, Warren, MD    Family History Family History  Problem Relation Age of Onset  . Other Unknown  doesn't know family history    Social History Social History   Tobacco Use  . Smoking status: Never Smoker  . Smokeless tobacco: Former NeurosurgeonUser    Types: Chew  Substance Use Topics  . Alcohol use: No    Alcohol/week: 0.0 oz    Comment: none since 2013, not regular drinker before but would occasional binging  . Drug use: No     Allergies   Patient has no known allergies.   Review of Systems Review of Systems  Constitutional: Negative for activity change.       All ROS Neg except as noted in HPI  HENT: Negative for nosebleeds.   Eyes: Negative for photophobia and discharge.  Respiratory: Negative for cough, shortness of breath and wheezing.   Cardiovascular: Negative for chest pain and palpitations.    Gastrointestinal: Negative for abdominal pain and blood in stool.  Genitourinary: Negative for dysuria, frequency and hematuria.  Musculoskeletal: Positive for arthralgias. Negative for back pain and neck pain.  Skin: Negative.   Neurological: Negative for dizziness, seizures and speech difficulty.  Psychiatric/Behavioral: Negative for confusion and hallucinations.     Physical Exam Updated Vital Signs BP (!) 152/83   Pulse 88   Temp 97.9 F (36.6 C) (Oral)   Resp 20   Ht 5\' 7"  (1.702 m)   Wt 106.6 kg (235 lb)   SpO2 97%   BMI 36.81 kg/m   Physical Exam  Constitutional: He is oriented to person, place, and time. He appears well-developed and well-nourished.  Non-toxic appearance.  HENT:  Head: Normocephalic.  Right Ear: Tympanic membrane and external ear normal.  Left Ear: Tympanic membrane and external ear normal.  Eyes: EOM and lids are normal. Pupils are equal, round, and reactive to light.  Neck: Normal range of motion. Neck supple. Carotid bruit is not present.  Cardiovascular: Normal rate, regular rhythm, normal heart sounds, intact distal pulses and normal pulses.  Pulmonary/Chest: Breath sounds normal. No respiratory distress.  Abdominal: Soft. Bowel sounds are normal. There is no tenderness. There is no guarding.  Musculoskeletal:       Right ankle: He exhibits decreased range of motion and swelling. He exhibits no deformity and normal pulse. Tenderness. Medial malleolus tenderness found. Achilles tendon normal.  Mild increase warmth of the right lower extremity. Pain with even light touch.  Lymphadenopathy:       Head (right side): No submandibular adenopathy present.       Head (left side): No submandibular adenopathy present.    He has no cervical adenopathy.  Neurological: He is alert and oriented to person, place, and time. He has normal strength. No cranial nerve deficit or sensory deficit.  Skin: Skin is warm and dry.  Psychiatric: He has a normal mood and  affect. His speech is normal.  Nursing note and vitals reviewed.    ED Treatments / Results  Labs (all labs ordered are listed, but only abnormal results are displayed) Labs Reviewed - No data to display  EKG  EKG Interpretation None       Radiology No results found.  Procedures Procedures (including critical care time)  Medications Ordered in ED Medications  indomethacin (INDOCIN) capsule 25 mg (25 mg Oral Given 08/28/17 0053)  promethazine (PHENERGAN) tablet 12.5 mg (12.5 mg Oral Given 08/28/17 0053)  dexamethasone (DECADRON) injection 10 mg (10 mg Intramuscular Given 08/28/17 0054)  HYDROcodone-acetaminophen (NORCO/VICODIN) 5-325 MG per tablet 2 tablet (2 tablets Oral Given 08/28/17 0053)     Initial Impression /  Assessment and Plan / ED Course  I have reviewed the triage vital signs and the nursing notes.  Pertinent labs & imaging results that were available during my care of the patient were reviewed by me and considered in my medical decision making (see chart for details).       Final Clinical Impressions(s) / ED Diagnoses Pt states this episode is similar to previous gout situations. No gross neuro-vascular deficit. No signs of infection. Pt treated with steroids, NSAIDs, and pain medications. Pt will follow up with PCP or return to the ED for any additional issue.   Final diagnoses:  Acute idiopathic gout, unspecified site    ED Discharge Orders        Ordered    HYDROcodone-acetaminophen (NORCO/VICODIN) 5-325 MG tablet  Every 4 hours PRN     08/28/17 0115    dexamethasone (DECADRON) 4 MG tablet  2 times daily with meals     08/28/17 0115    indomethacin (INDOCIN) 25 MG capsule  3 times daily PRN     08/28/17 0115       Ivery Quale, PA-C 08/28/17 0121    Lavera Guise, MD 08/28/17 [phone]

## 2017-08-28 NOTE — Discharge Instructions (Signed)
Please elevate your right ankle. Use your gout medications as suggested. Return to the ED if any changes or problem.

## 2017-09-29 ENCOUNTER — Encounter (HOSPITAL_COMMUNITY): Payer: Self-pay | Admitting: Emergency Medicine

## 2017-09-29 ENCOUNTER — Emergency Department (HOSPITAL_COMMUNITY)
Admission: EM | Admit: 2017-09-29 | Discharge: 2017-09-29 | Disposition: A | Discharge status: Home / Self Care | Payer: Self-pay | Attending: Emergency Medicine | Admitting: Emergency Medicine

## 2017-09-29 ENCOUNTER — Emergency Department (HOSPITAL_COMMUNITY): Payer: Self-pay

## 2017-09-29 DIAGNOSIS — M109 Gout, unspecified: Secondary | ICD-10-CM

## 2017-09-29 DIAGNOSIS — Z79899 Other long term (current) drug therapy: Secondary | ICD-10-CM | POA: Insufficient documentation

## 2017-09-29 DIAGNOSIS — I2510 Atherosclerotic heart disease of native coronary artery without angina pectoris: Secondary | ICD-10-CM | POA: Insufficient documentation

## 2017-09-29 DIAGNOSIS — Z87891 Personal history of nicotine dependence: Secondary | ICD-10-CM | POA: Insufficient documentation

## 2017-09-29 DIAGNOSIS — I252 Old myocardial infarction: Secondary | ICD-10-CM | POA: Insufficient documentation

## 2017-09-29 DIAGNOSIS — M79674 Pain in right toe(s): Secondary | ICD-10-CM

## 2017-09-29 DIAGNOSIS — N189 Chronic kidney disease, unspecified: Secondary | ICD-10-CM | POA: Insufficient documentation

## 2017-09-29 MED ORDER — HYDROCODONE-ACETAMINOPHEN 5-325 MG PO TABS: ORAL_TABLET | ORAL | 0 refills | Status: DC

## 2017-09-29 MED ORDER — OXYCODONE-ACETAMINOPHEN 5-325 MG PO TABS
1.0000 | ORAL_TABLET | Freq: Once | ORAL | Status: AC
  Administered 2017-09-29: 1 via ORAL
  Filled 2017-09-29: qty 1

## 2017-09-29 MED ORDER — INDOMETHACIN 25 MG PO CAPS: ORAL_CAPSULE | ORAL | 0 refills | Status: DC

## 2017-09-29 NOTE — ED Triage Notes (Signed)
Patient complaining of right big toe pain starting upon awakening this morning. States he has history of gout and took indocin this morning at 0630.

## 2017-09-29 NOTE — ED Provider Notes (Signed)
[facility]GENCY [facility]PARTMENT Provider Note   CSN: [id number] Arrival date & time: 09/29/17  1115     History   Chief Complaint Chief Complaint  Patient presents with  . Toe Pain    HPI John Wallace is a 56 y.o. male.   Toe Pain     Pt was seen at 1225.  Per pt, c/o gradual onset and persistence of constant right great toe "pain" since yesterday. Pt states he took his last dose of indocin yesterday. Describes the pain as "my gout pain." Denies fevers, no focal motor weakness, no tingling/numbness in extremities, no injury.  The symptoms have been associated with no other complaints. The patient has a significant history of similar symptoms previously, recently being evaluated for this complaint and multiple prior evals for same.     Past Medical History:  Diagnosis Date  . Chronic kidney disease   . Coronary artery disease   . Diverticulosis   . GERD (gastroesophageal reflux disease)    no medical treatment  . Gout   . Myocardial infarction ([facility])    mild Heart attack 2002  . Obesity   . Renal stone    passed   . Seizures ([facility])    as a child    Patient Active Problem List   Diagnosis Date Noted  . Gout 02/10/2017  . Abdominal pain 09/17/2015  . Constipation 09/17/2015  . Mucosal abnormality of stomach   . Hiatal hernia   . Dysphagia   . Mucosal abnormality of colon   . GERD (gastroesophageal reflux disease) 07/22/2015  . Esophageal dysphagia 07/22/2015  . Fatty liver 07/22/2015  . Family history of colonic polyps 07/22/2015  . ANKLE PAIN, RIGHT 03/19/2008    Past Surgical History:  Procedure Laterality Date  . APPENDECTOMY    . BACK SURGERY     x5 surgeries  . BIOPSY N/A 08/07/2015   Procedure: Gastric Biopsies;  Surgeon: Corbin Adeobert M Rourk, MD;  Location: [facility];  Service: Endoscopy;  Laterality: N/A;  . CARDIAC CATHETERIZATION    . COLONOSCOPY  2003   RMR: Normal appearing colonic mucosa to 35-40 cm. Spasm versus persistent swelling and  noncompliance of the colon along with patient's discomfort precluded completion of exam.  . COLONOSCOPY WITH PROPOFOL N/A 08/07/2015   RMR: Status post segmental colonic resection. Focally abnormal appeearing colonic mucosa of doubtful clinical significance status post biopsy.   . ESOPHAGEAL DILATION N/A 08/07/2015   Procedure: ESOPHAGEAL DILATION;  Surgeon: Corbin Adeobert M Rourk, MD;  Location: [facility];  Service: Endoscopy;  Laterality: N/A;  #56 maloney dilator  . ESOPHAGOGASTRODUODENOSCOPY (EGD) WITH PROPOFOL N/A 08/07/2015   RMR: Normal esophagus- status post maloney dilation. Hiatal hernia. Abnormal stomach as described status post biopsy.   Marland Kitchen. PARTIAL COLECTOMY     Dr Bradford:diverticulosis  . throat biop     benign       Home Medications    Prior to Admission medications   Medication Sig Start Date End Date Taking? Authorizing Provider  allopurinol (ZYLOPRIM) 300 MG tablet Take 1 tablet (300 mg total) by mouth daily. For gout prevention 03/25/17  Yes Mechele ClaudeStacks, Warren, MD  ibuprofen (ADVIL,MOTRIN) 200 MG tablet Take 200 mg by mouth every 6 (six) hours as needed for mild pain or moderate pain.   Yes [provider]  indomethacin (INDOCIN) 25 MG capsule Take 1 capsule (25 mg total) 3 (three) times daily as needed by mouth. Take this medication with food 08/28/17  Yes Ivery QualeBryant, [place], PA-C  pantoprazole (PROTONIX) 40 MG tablet Take 1 tablet (40 mg total) by mouth daily. 02/10/17  Yes Mechele Claude, MD  dexamethasone (DECADRON) 4 MG tablet Take 1 tablet (4 mg total) 2 (two) times daily with a meal by mouth. 08/28/17   Ivery Quale, PA-C  HYDROcodone-acetaminophen (NORCO/VICODIN) 5-325 MG tablet Take 1 tablet every 4 (four) hours as needed by mouth. 08/28/17   Ivery Quale, PA-C    Family History Family History  Problem Relation Age of Onset  . Other Unknown        doesn't know family history    Social History Social History   Tobacco Use  . Smoking status: Never Smoker  .  Smokeless tobacco: Former Neurosurgeon    Types: Chew  Substance Use Topics  . Alcohol use: No    Alcohol/week: 0.0 oz    Comment: none since 2013, not regular drinker before but would occasional binging  . Drug use: No     Allergies   Patient has no known allergies.   Review of Systems Review of Systems ROS: Statement: All systems negative except as marked or noted in the HPI; Constitutional: Negative for fever and chills. ; ; Eyes: Negative for eye pain, redness and discharge. ; ; ENMT: Negative for ear pain, hoarseness, nasal congestion, sinus pressure and sore throat. ; ; Cardiovascular: Negative for chest pain, palpitations, diaphoresis, dyspnea and peripheral edema. ; ; Respiratory: Negative for cough, wheezing and stridor. ; ; Gastrointestinal: Negative for nausea, vomiting, diarrhea, abdominal pain, blood in stool, hematemesis, jaundice and rectal bleeding. . ; ; Genitourinary: Negative for dysuria, flank pain and hematuria. ; ; Musculoskeletal: +right great toe pain. Negative for back pain and neck pain. Negative for swelling and trauma.; ; Skin: Negative for pruritus, rash, abrasions, blisters, bruising and skin lesion.; ; Neuro: Negative for headache, lightheadedness and neck stiffness. Negative for weakness, altered level of consciousness, altered mental status, extremity weakness, paresthesias, involuntary movement, seizure and syncope.       Physical Exam Updated Vital Signs BP 139/84 (BP Location: Left Arm)   Pulse 86   Temp 98.3 F (36.8 C) (Oral)   Resp 16   Ht 5\' 8"  (1.727 m)   Wt 115.7 kg (255 lb)   SpO2 97%   BMI 38.77 kg/m   Physical Exam 1230: Physical examination:  Nursing notes reviewed; Vital signs and O2 SAT reviewed;  Constitutional: Well developed, Well nourished, Well hydrated, In no acute distress; Head:  Normocephalic, atraumatic; Eyes: EOMI, PERRL, No scleral icterus; ENMT: Mouth and pharynx normal, Mucous membranes moist; Neck: Supple, Full range of  motion, No lymphadenopathy; Cardiovascular: Regular rate and rhythm, No gallop; Respiratory: Breath sounds clear & equal bilaterally, No wheezes.  Speaking full sentences with ease, Normal respiratory effort/excursion; Chest: Nontender, Movement normal; Abdomen: Soft, Nontender, Nondistended, Normal bowel sounds; Genitourinary: No CVA tenderness; Extremities: Pulses normal, +right great toe/MTP area with localized tenderness and mild erythema. No open wounds, no streaking, no ecchymosis. NT right foot/ankle/knee. No calf tenderness, edema or asymmetry.; Neuro: AA&Ox3, Major CN grossly intact.  Speech clear. No gross focal motor or sensory deficits in extremities.; Skin: Color normal, Warm, Dry.   ED Treatments / Results  Labs (all labs ordered are listed, but only abnormal results are displayed)   EKG  EKG Interpretation None       Radiology   Procedures Procedures (including critical care time)  Medications Ordered in ED Medications - No data to display   Initial Impression / Assessment and Plan /  ED Course  I have reviewed the triage vital signs and the nursing notes.  Pertinent labs & imaging results that were available during my care of the patient were reviewed by me and considered in my medical decision making (see chart for details).  MDM Reviewed: previous chart, nursing note and vitals Interpretation: x-ray    Dg Foot Complete Right Result Date: 09/29/2017 CLINICAL DATA:  Pain, gout EXAM: RIGHT FOOT COMPLETE - 3+ VIEW COMPARISON:  None. FINDINGS: There is no evidence of fracture or dislocation. There is mild osteoarthritis of the first MTP joint. There is mild osteoarthritis of the talonavicular joint. There is generalized osteopenia. There are no erosive changes. There is a small plantar calcaneal spur. The soft tissues are normal. IMPRESSION: No acute osseous injury of the right foot. Electronically Signed   By: Elige KoHetal  Patel   On: 09/29/2017 12:16    1235:  Known  gout. Does not appear cellulitis at this time. Tx symptomatically. Dx and testing d/w pt.  Questions answered.  Verb understanding, agreeable to d/c home with outpt f/u.     Final Clinical Impressions(s) / ED Diagnoses   Final diagnoses:  None    ED Discharge Orders    None        Samuel JesterMcManus, Ayson Cherubini, DO 10/01/17 [id number]

## 2017-09-29 NOTE — Discharge Instructions (Signed)
Take the prescriptions as directed.  Call your regular medical doctor today to schedule a follow up appointment next week.  Return to the Emergency Department immediately sooner if worsening.

## 2017-11-10 ENCOUNTER — Other Ambulatory Visit: Payer: Self-pay | Admitting: Family Medicine

## 2017-11-11 NOTE — Telephone Encounter (Signed)
Last seen 02/10/17  Dr Stacks 

## 2017-11-13 ENCOUNTER — Encounter (HOSPITAL_COMMUNITY): Payer: Self-pay | Admitting: Emergency Medicine

## 2017-11-13 ENCOUNTER — Emergency Department (HOSPITAL_COMMUNITY): Payer: Self-pay

## 2017-11-13 ENCOUNTER — Other Ambulatory Visit: Payer: Self-pay

## 2017-11-13 ENCOUNTER — Emergency Department (HOSPITAL_COMMUNITY)
Admission: EM | Admit: 2017-11-13 | Discharge: 2017-11-13 | Disposition: A | Discharge status: Home / Self Care | Payer: Self-pay | Attending: Emergency Medicine | Admitting: Emergency Medicine

## 2017-11-13 DIAGNOSIS — M722 Plantar fascial fibromatosis: Secondary | ICD-10-CM | POA: Insufficient documentation

## 2017-11-13 DIAGNOSIS — N189 Chronic kidney disease, unspecified: Secondary | ICD-10-CM | POA: Insufficient documentation

## 2017-11-13 DIAGNOSIS — Z87891 Personal history of nicotine dependence: Secondary | ICD-10-CM | POA: Insufficient documentation

## 2017-11-13 DIAGNOSIS — Z79899 Other long term (current) drug therapy: Secondary | ICD-10-CM | POA: Insufficient documentation

## 2017-11-13 MED ORDER — TRAMADOL HCL 50 MG PO TABS: 50.0000 mg | ORAL_TABLET | Freq: Four times a day (QID) | ORAL | 0 refills | Status: DC | PRN

## 2017-11-13 MED ORDER — NAPROXEN 500 MG PO TABS: 500.0000 mg | ORAL_TABLET | Freq: Two times a day (BID) | ORAL | 0 refills | Status: DC

## 2017-11-13 NOTE — ED Triage Notes (Signed)
Patient reports he fell yesterday and injured his R heel.

## 2017-11-13 NOTE — ED Notes (Signed)
Pt reports heel pain for 1 month  Yesterday cat got out- pt stepped hard on the ground and had pain from heel to knee- this am when he awakened, it was more severe  Here for evaluation

## 2017-11-13 NOTE — Discharge Instructions (Signed)
Try using a heel insert in your shoes and rolling your foot over a cold bottle or can.  Call the foot doctor listed to arrange a follow-up appt in one week if not improving.  Do not take your Indocin while taking the naprosyn

## 2017-11-13 NOTE — ED Provider Notes (Signed)
[facility] EMERGENCY DEPARTMENT Provider Note   CSN: [id number] Arrival date & time: 11/13/17  1816     History   Chief Complaint Chief Complaint  Patient presents with  . Foot Pain    HPI John Wallace is a 57 y.o. male.  HPI   John Wallace is a 57 y.o. male who presents to the Emergency Department complaining of right heel pain that has been waxing and waning for one month.  He reports gradual pain to his right heel that is associated with weight bearing.  He tried wearing new shoes and shoe inserts which helped until yesterday when he accidentally fell, striking his right foot hard on the ground.  Pain is constant.  Nothing improves his symptoms.  He denies numbness, swelling, discoloration or pain to the extremity.     Past Medical History:  Diagnosis Date  . Chronic kidney disease   . Coronary artery disease   . Diverticulosis   . GERD (gastroesophageal reflux disease)    no medical treatment  . Gout   . Myocardial infarction (HCC)    mild Heart attack 2002  . Obesity   . Renal stone    passed   . Seizures (HCC)    as a child    Patient Active Problem List   Diagnosis Date Noted  . Gout 02/10/2017  . Abdominal pain 09/17/2015  . Constipation 09/17/2015  . Mucosal abnormality of stomach   . Hiatal hernia   . Dysphagia   . Mucosal abnormality of colon   . GERD (gastroesophageal reflux disease) 07/22/2015  . Esophageal dysphagia 07/22/2015  . Fatty liver 07/22/2015  . Family history of colonic polyps 07/22/2015  . ANKLE PAIN, RIGHT 03/19/2008    Past Surgical History:  Procedure Laterality Date  . APPENDECTOMY    . BACK SURGERY     x5 surgeries  . BIOPSY N/A 08/07/2015   Procedure: Gastric Biopsies;  Surgeon: Corbin Ade, MD;  Location: [facility];  Service: Endoscopy;  Laterality: N/A;  . CARDIAC CATHETERIZATION    . COLONOSCOPY  2003   RMR: Normal appearing colonic mucosa to 35-40 cm. Spasm versus persistent swelling and noncompliance of the  colon along with patient's discomfort precluded completion of exam.  . COLONOSCOPY WITH PROPOFOL N/A 08/07/2015   RMR: Status post segmental colonic resection. Focally abnormal appeearing colonic mucosa of doubtful clinical significance status post biopsy.   . ESOPHAGEAL DILATION N/A 08/07/2015   Procedure: ESOPHAGEAL DILATION;  Surgeon: Corbin Ade, MD;  Location: [facility];  Service: Endoscopy;  Laterality: N/A;  #56 maloney dilator  . ESOPHAGOGASTRODUODENOSCOPY (EGD) WITH PROPOFOL N/A 08/07/2015   RMR: Normal esophagus- status post maloney dilation. Hiatal hernia. Abnormal stomach as described status post biopsy.   Marland Kitchen PARTIAL COLECTOMY     Dr Bradford:diverticulosis  . throat biop     benign       Home Medications    Prior to Admission medications   Medication Sig Start Date End Date Taking? Authorizing Provider  allopurinol (ZYLOPRIM) 300 MG tablet Take 1 tablet (300 mg total) by mouth daily. For gout prevention 03/25/17   Mechele Claude, MD  dexamethasone (DECADRON) 4 MG tablet Take 1 tablet (4 mg total) 2 (two) times daily with a meal by mouth. 08/28/17   Ivery Quale, PA-C  HYDROcodone-acetaminophen (NORCO/VICODIN) 5-325 MG tablet 1 or 2 tabs PO q6 hours prn pain 09/29/17   Samuel Jester, DO  ibuprofen (ADVIL,MOTRIN) 200 MG tablet Take 200 mg by mouth  every 6 (six) hours as needed for mild pain or moderate pain.    [provider]  indomethacin (INDOCIN) 25 MG capsule 50mg  PO TID x2 days, then 25mg  PO TID x3 days 09/29/17   Samuel JesterMcManus, Kathleen, DO  indomethacin (INDOCIN) 25 MG capsule TAKE ONE CAPSULE BY MOUTH THREE TIMES A DAY AS NEEDED 11/11/17   Mechele ClaudeStacks, Warren, MD  pantoprazole (PROTONIX) 40 MG tablet Take 1 tablet (40 mg total) by mouth daily. 02/10/17   Mechele ClaudeStacks, Warren, MD    Family History Family History  Problem Relation Age of Onset  . Other Unknown        doesn't know family history    Social History Social History   Tobacco Use  . Smoking status: Never  Smoker  . Smokeless tobacco: Former NeurosurgeonUser    Types: Chew  Substance Use Topics  . Alcohol use: No    Alcohol/week: 0.0 oz    Comment: none since 2013, not regular drinker before but would occasional binging  . Drug use: No     Allergies   Patient has no known allergies.   Review of Systems Review of Systems  Constitutional: Negative for chills and fever.  Respiratory: Negative for chest tightness and shortness of breath.   Musculoskeletal: Positive for arthralgias (right foot pain). Negative for back pain.  Skin: Negative for color change and wound.  Neurological: Negative for dizziness, weakness and numbness.  All other systems reviewed and are negative.    Physical Exam Updated Vital Signs BP (!) 161/99 (BP Location: Left Arm)   Pulse 70   Temp 98.1 F (36.7 C) (Oral)   Resp 18   Ht 5\' 8"  (1.727 m)   Wt 108.4 kg (239 lb)   SpO2 97%   BMI 36.34 kg/m   Physical Exam  Constitutional: He is oriented to person, place, and time. He appears well-developed and well-nourished. No distress.  HENT:  Head: Atraumatic.  Cardiovascular: Normal rate, regular rhythm and intact distal pulses.  DP pulses are strong and palpable  Pulmonary/Chest: Effort normal and breath sounds normal.  Musculoskeletal: Normal range of motion. He exhibits tenderness. He exhibits no edema or deformity.  ttp of the right hind foot.  Point tenderness of the heel.  No open wounds, edema or erythema.    Neurological: He is alert and oriented to person, place, and time. He exhibits normal muscle tone. Coordination normal.  Skin: Skin is warm and dry.  Nursing note and vitals reviewed.    ED Treatments / Results  Labs (all labs ordered are listed, but only abnormal results are displayed) Labs Reviewed - No data to display  EKG  EKG Interpretation None       Radiology Dg Foot Complete Right  Result Date: 11/13/2017 CLINICAL DATA:  Right heel pain following a fall yesterday. EXAM: RIGHT FOOT  COMPLETE - 3+ VIEW COMPARISON:  09/29/2017. FINDINGS: Mild 1st MTP joint spur formation, mild calcaneal spur formation and mild dorsal tarsal spur formation. Mild talotibial spur formation. No fracture, dislocation or effusion seen. IMPRESSION: No fracture.  Stable degenerative changes. Electronically Signed   By: Beckie SaltsSteven  Reid M.D.   On: 11/13/2017 18:57    Procedures Procedures (including critical care time)  Medications Ordered in ED Medications - No data to display   Initial Impression / Assessment and Plan / ED Course  I have reviewed the triage vital signs and the nursing notes.  Pertinent labs & imaging results that were available during my care of the patient  were reviewed by me and considered in my medical decision making (see chart for details).     XR neg for fx.  NV intact.  Sx's likely from plantar fasciitis   Final Clinical Impressions(s) / ED Diagnoses   Final diagnoses:  Plantar fasciitis    ED Discharge Orders    None       Rosey Bath 11/13/17 2138    Benjiman Core, MD 11/14/17 [phone]

## 2017-11-28 ENCOUNTER — Ambulatory Visit: Payer: Self-pay | Admitting: Family Medicine

## 2017-11-28 ENCOUNTER — Other Ambulatory Visit: Payer: Self-pay | Admitting: Family Medicine

## 2017-11-28 ENCOUNTER — Encounter: Payer: Self-pay | Admitting: Family Medicine

## 2017-11-28 VITALS — BP 153/90 | HR 70 | Temp 97.8°F | Ht 68.0 in | Wt 248.0 lb

## 2017-11-28 DIAGNOSIS — R399 Unspecified symptoms and signs involving the genitourinary system: Secondary | ICD-10-CM

## 2017-11-28 DIAGNOSIS — M722 Plantar fascial fibromatosis: Secondary | ICD-10-CM

## 2017-11-28 DIAGNOSIS — N410 Acute prostatitis: Secondary | ICD-10-CM

## 2017-11-28 LAB — URINALYSIS
BILIRUBIN UA: NEGATIVE
GLUCOSE, UA: NEGATIVE
LEUKOCYTES UA: NEGATIVE
Nitrite, UA: NEGATIVE
PROTEIN UA: NEGATIVE
RBC UA: NEGATIVE
SPEC GRAV UA: 1.025 (ref 1.005–1.030)
Urobilinogen, Ur: 0.2 mg/dL (ref 0.2–1.0)
pH, UA: 5.5 (ref 5.0–7.5)

## 2017-11-28 MED ORDER — DOXYCYCLINE HYCLATE 100 MG PO TABS: 100.0000 mg | ORAL_TABLET | Freq: Two times a day (BID) | ORAL | 0 refills | Status: DC

## 2017-11-28 MED ORDER — PREDNISONE 10 MG PO TABS: ORAL_TABLET | ORAL | 0 refills | Status: DC

## 2017-11-28 NOTE — Patient Instructions (Signed)

## 2017-11-28 NOTE — Progress Notes (Signed)
Chief Complaint  Patient presents with  . Foot Pain    pt here today c/o bilateral foot pain and was seen at [facility] ED 11/13/17 and was told he had pantar fascitis    HPI  Patient presents today for worsening pain at the right heel.  Also he has started having pain at the left heel as well.  It does seem to get better when it is warmer out and when he has been up on it a bit.  Worst in the mornings.  Patient also relates that he has frequency of urination as well as urgency.  This is been going on for about 2 weeks.  He is noticed some midline low back pain corresponding that started about the same time.   PMH: Smoking status noted ROS: Objective: BP (!) 153/90   Pulse 70   Temp 97.8 F (36.6 C) (Oral)   Ht 5\' 8"  (1.727 m)   Wt 248 lb (112.5 kg)   BMI 37.71 kg/m  Gen: NAD, alert, cooperative with exam HEENT: NCAT, EOMI, PERRL CV: RRR, good S1/S2, no murmur Resp: CTABL, no wheezes, non-labored Abd: SNTND, BS present, no guarding or organomegaly Ext: No edema, warm.  There is marked tenderness at the plantar fascial origin on the right milder on the left but present.  There is full range of motion both lower extremities with excellent DP and PT pulses. Neuro: Alert and oriented, No gross deficits  Assessment and plan:  1. UTI symptoms   2. Plantar fasciitis   3. Acute prostatitis     Meds ordered this encounter  Medications  . predniSONE (DELTASONE) 10 MG tablet    Sig: Take 5 PO x 3 days, then 4 PO x 3 days, then 3 PO x 3 days, then 2 PO x 3 days then 1 PO x 3 days.    Dispense:  45 tablet    Refill:  0  . doxycycline (VIBRA-TABS) 100 MG tablet    Sig: Take 1 tablet (100 mg total) by mouth 2 (two) times daily.    Dispense:  28 tablet    Refill:  0    Orders Placed This Encounter  Procedures  . Urinalysis  Handout for plantar fascia exercises given.  Follow up as needed.  Mechele ClaudeWarren Lizzete Gough, MD

## 2018-01-24 ENCOUNTER — Other Ambulatory Visit: Payer: Self-pay | Admitting: Family Medicine

## 2018-03-03 ENCOUNTER — Other Ambulatory Visit: Payer: Self-pay | Admitting: Family Medicine

## 2018-05-04 ENCOUNTER — Other Ambulatory Visit: Payer: Self-pay | Admitting: Family Medicine

## 2018-05-04 NOTE — Telephone Encounter (Signed)
Last seen 11/28/17  Dr Stacks 

## 2018-06-13 ENCOUNTER — Emergency Department (HOSPITAL_COMMUNITY)
Admission: EM | Admit: 2018-06-13 | Discharge: 2018-06-13 | Disposition: A | Discharge status: Home / Self Care | Payer: Self-pay | Attending: Emergency Medicine | Admitting: Emergency Medicine

## 2018-06-13 ENCOUNTER — Encounter (HOSPITAL_COMMUNITY): Payer: Self-pay | Admitting: Emergency Medicine

## 2018-06-13 ENCOUNTER — Emergency Department (HOSPITAL_COMMUNITY): Payer: Self-pay

## 2018-06-13 DIAGNOSIS — Z79899 Other long term (current) drug therapy: Secondary | ICD-10-CM | POA: Insufficient documentation

## 2018-06-13 DIAGNOSIS — I2510 Atherosclerotic heart disease of native coronary artery without angina pectoris: Secondary | ICD-10-CM | POA: Insufficient documentation

## 2018-06-13 DIAGNOSIS — M25511 Pain in right shoulder: Secondary | ICD-10-CM | POA: Insufficient documentation

## 2018-06-13 DIAGNOSIS — N189 Chronic kidney disease, unspecified: Secondary | ICD-10-CM | POA: Insufficient documentation

## 2018-06-13 MED ORDER — DICLOFENAC SODIUM 50 MG PO TBEC: 50.0000 mg | DELAYED_RELEASE_TABLET | Freq: Two times a day (BID) | ORAL | 0 refills | Status: DC

## 2018-06-13 MED ORDER — METHOCARBAMOL 500 MG PO TABS: 500.0000 mg | ORAL_TABLET | Freq: Two times a day (BID) | ORAL | 0 refills | Status: DC

## 2018-06-13 NOTE — ED Triage Notes (Signed)
Pt reports R arm pain X1 month. Has used ice, heat, bengay without relief. Has not contacted PCP.

## 2018-06-13 NOTE — ED Provider Notes (Signed)
[facility] EMERGENCY DEPARTMENT Provider Note   CSN: [id number] Arrival date & time: 06/13/18  2122     History   Chief Complaint Chief Complaint  Patient presents with  . Arm Pain    HPI John Wallace is a 57 y.o. male.  The history is provided by the patient. No language interpreter was used.  Arm Pain  This is a new problem. Episode onset: months. The problem occurs constantly. Exacerbated by: movement. Nothing relieves the symptoms. He has tried nothing for the symptoms. The treatment provided no relief.    Past Medical History:  Diagnosis Date  . Chronic kidney disease   . Coronary artery disease   . Diverticulosis   . GERD (gastroesophageal reflux disease)    no medical treatment  . Gout   . Myocardial infarction (HCC)    mild Heart attack 2002  . Obesity   . Renal stone    passed   . Seizures (HCC)    as a child    Patient Active Problem List   Diagnosis Date Noted  . Gout 02/10/2017  . Abdominal pain 09/17/2015  . Constipation 09/17/2015  . Mucosal abnormality of stomach   . Hiatal hernia   . Dysphagia   . Mucosal abnormality of colon   . GERD (gastroesophageal reflux disease) 07/22/2015  . Esophageal dysphagia 07/22/2015  . Fatty liver 07/22/2015  . Family history of colonic polyps 07/22/2015  . ANKLE PAIN, RIGHT 03/19/2008    Past Surgical History:  Procedure Laterality Date  . APPENDECTOMY    . BACK SURGERY     x5 surgeries  . BIOPSY N/A 08/07/2015   Procedure: Gastric Biopsies;  Surgeon: Corbin Ade, MD;  Location: [facility];  Service: Endoscopy;  Laterality: N/A;  . CARDIAC CATHETERIZATION    . COLONOSCOPY  2003   RMR: Normal appearing colonic mucosa to 35-40 cm. Spasm versus persistent swelling and noncompliance of the colon along with patient's discomfort precluded completion of exam.  . COLONOSCOPY WITH PROPOFOL N/A 08/07/2015   RMR: Status post segmental colonic resection. Focally abnormal appeearing colonic mucosa of doubtful  clinical significance status post biopsy.   . ESOPHAGEAL DILATION N/A 08/07/2015   Procedure: ESOPHAGEAL DILATION;  Surgeon: Corbin Ade, MD;  Location: [facility];  Service: Endoscopy;  Laterality: N/A;  #56 maloney dilator  . ESOPHAGOGASTRODUODENOSCOPY (EGD) WITH PROPOFOL N/A 08/07/2015   RMR: Normal esophagus- status post maloney dilation. Hiatal hernia. Abnormal stomach as described status post biopsy.   Marland Kitchen PARTIAL COLECTOMY     Dr Bradford:diverticulosis  . throat biop     benign        Home Medications    Prior to Admission medications   Medication Sig Start Date End Date Taking? Authorizing Provider  allopurinol (ZYLOPRIM) 300 MG tablet Take 1 tablet (300 mg total) by mouth daily. For gout prevention 03/25/17   Mechele Claude, MD  doxycycline (VIBRA-TABS) 100 MG tablet Take 1 tablet (100 mg total) by mouth 2 (two) times daily. 11/28/17   Mechele Claude, MD  indomethacin (INDOCIN) 25 MG capsule 50mg  PO TID x2 days, then 25mg  PO TID x3 days 09/29/17   Samuel Jester, DO  indomethacin (INDOCIN) 25 MG capsule TAKE ONE CAPSULE BY MOUTH THREE TIMES A DAY AS NEEDED 05/05/18   Mechele Claude, MD  pantoprazole (PROTONIX) 40 MG tablet TAKE ONE TABLET BY MOUTH DAILY 11/28/17   Mechele Claude, MD  predniSONE (DELTASONE) 10 MG tablet Take 5 PO x 3 days, then 4 PO  x 3 days, then 3 PO x 3 days, then 2 PO x 3 days then 1 PO x 3 days. 11/28/17   Mechele Claude, MD    Family History Family History  Problem Relation Age of Onset  . Other Unknown        doesn't know family history    Social History Social History   Tobacco Use  . Smoking status: Never Smoker  . Smokeless tobacco: Former Neurosurgeon    Types: Chew  Substance Use Topics  . Alcohol use: No    Alcohol/week: 0.0 standard drinks    Comment: none since 2013, not regular drinker before but would occasional binging  . Drug use: No     Allergies   Patient has no known allergies.   Review of Systems Review of Systems  All other  systems reviewed and are negative.    Physical Exam Updated Vital Signs BP (!) 161/94 (BP Location: Left Arm)   Pulse 78   Temp 98 F (36.7 C) (Oral)   Resp 18   Ht 5\' 7"  (1.702 m)   Wt 110.2 kg   SpO2 96%   BMI 38.06 kg/m   Physical Exam  Constitutional: He appears well-developed and well-nourished.  HENT:  Head: Normocephalic.  Cardiovascular: Normal rate.  Pulmonary/Chest: Effort normal.  Musculoskeletal: He exhibits tenderness.  Pain with lifting arm, pain with lowering arm.  Decreased range of motion,  nv and ns intact   Neurological: He is alert.  Skin: Skin is warm.  Nursing note and vitals reviewed.    ED Treatments / Results  Labs (all labs ordered are listed, but only abnormal results are displayed) Labs Reviewed - No data to display  EKG None  Radiology Dg Shoulder Right  Result Date: 06/13/2018 CLINICAL DATA:  Right shoulder pain EXAM: RIGHT SHOULDER - 2+ VIEW COMPARISON:  09/26/2005 FINDINGS: AC joint degenerative change. Mild glenohumeral degenerative change. No fracture or malalignment. IMPRESSION: Mild arthritis.  No acute osseous abnormality Electronically Signed   By: Jasmine Pang M.D.   On: 06/13/2018 23:11    Procedures Procedures (including critical care time)  Medications Ordered in ED Medications - No data to display   Initial Impression / Assessment and Plan / ED Course  I have reviewed the triage vital signs and the nursing notes.  Pertinent labs & imaging results that were available during my care of the patient were reviewed by me and considered in my medical decision making (see chart for details).    xrays reviewed.  Pt advised follow up with Orthopaedist for evatluion.    Final Clinical Impressions(s) / ED Diagnoses   Final diagnoses:  Acute pain of right shoulder    ED Discharge Orders         Ordered    methocarbamol (ROBAXIN) 500 MG tablet  2 times daily     06/13/18 2324    diclofenac (VOLTAREN) 50 MG EC tablet   2 times daily     06/13/18 2324        An After Visit Summary was printed and given to the patient.    Elson Areas, PA-C 06/13/18 2325    Maia Plan, MD 06/14/18 [phone]

## 2018-07-13 ENCOUNTER — Other Ambulatory Visit: Payer: Self-pay | Admitting: Family Medicine

## 2018-07-27 ENCOUNTER — Encounter: Payer: Self-pay | Admitting: Family

## 2018-07-27 ENCOUNTER — Ambulatory Visit (INDEPENDENT_AMBULATORY_CARE_PROVIDER_SITE_OTHER): Payer: Self-pay | Admitting: Family

## 2018-07-27 VITALS — BP 134/80 | HR 68 | Temp 97.4°F | Ht 67.0 in | Wt 252.0 lb

## 2018-07-27 DIAGNOSIS — M7581 Other shoulder lesions, right shoulder: Secondary | ICD-10-CM

## 2018-07-27 MED ORDER — DICLOFENAC SODIUM 50 MG PO TBEC: 50.0000 mg | DELAYED_RELEASE_TABLET | Freq: Two times a day (BID) | ORAL | 0 refills | Status: DC

## 2018-07-27 MED ORDER — PREDNISONE 20 MG PO TABS: ORAL_TABLET | ORAL | 0 refills | Status: DC

## 2018-07-27 NOTE — Progress Notes (Signed)
   Subjective:    Patient ID: John Wallace, male    DOB: Aug 04, 1961, 57 y.o.   MRN: [id number]  Chief Complaint  Patient presents with  . Shoulder Pain    Right   PT presents to the office today for recurrent right shoulder pain. He went to the ED on 06/13/18 and had a negative x-ray. He was given prednisone, robaxin, and diclofenac with mild relief. He states it helped for 2 days, but now is worse. Denies any injury that he is aware of.   Pt is self pay.  Shoulder Pain   The pain is present in the right shoulder. This is a new problem. The current episode started more than 1 month ago. There has been no history of extremity trauma. The problem occurs intermittently. The problem has been gradually worsening. The quality of the pain is described as aching. Pain scale: no pain if not moving it, 7 when reaching. The pain is moderate. Associated symptoms include a limited range of motion and numbness. Pertinent negatives include no inability to bear weight or tingling. The symptoms are aggravated by activity. He has tried rest, movement and NSAIDS (prednisone. robaxin. and diclofenac ) for the symptoms. The treatment provided mild relief.      Review of Systems  Neurological: Positive for numbness. Negative for tingling.  All other systems reviewed and are negative.      Objective:   Physical Exam  Constitutional: He is oriented to person, place, and time. He appears well-developed and well-nourished. No distress.  HENT:  Head: Normocephalic.  Cardiovascular: Normal rate, regular rhythm, normal heart sounds and intact distal pulses.  No murmur heard. Pulmonary/Chest: Effort normal and breath sounds normal. No respiratory distress. He has no wheezes.  Abdominal: Soft. Bowel sounds are normal. He exhibits no distension. There is no tenderness.  Musculoskeletal: He exhibits tenderness. He exhibits no edema.  Pain in right shoulder with abduction   Neurological: He is alert and oriented  to person, place, and time. He has normal reflexes. No cranial nerve deficit.  Skin: Skin is warm and dry. No rash noted. No erythema.  Psychiatric: He has a normal mood and affect. His behavior is normal. Judgment and thought content normal.  Vitals reviewed.     BP 134/80   Pulse 68   Temp (!) 97.4 F (36.3 C) (Oral)   Ht 5\' 7"  (1.702 m)   Wt 252 lb (114.3 kg)   BMI 39.47 kg/m      Assessment & Plan:  John Wallace comes in today with chief complaint of Shoulder Pain (Right)   Diagnosis and orders addressed:  1. Rotator cuff tendinitis, right Rest Ice ROM exercises RTO if symptoms worsen or do not improve  - predniSONE (DELTASONE) 20 MG tablet; Take 3 tabs daily for 1 week, then 2 tabs for 1 week, then 1 tab for one week  Dispense: 42 tablet; Refill: 0 - diclofenac (VOLTAREN) 50 MG EC tablet; Take 1 tablet (50 mg total) by mouth 2 (two) times daily.  Dispense: 60 tablet; Refill: 0   Jannifer Rodney, FNP

## 2018-07-27 NOTE — Patient Instructions (Signed)

## 2018-08-15 ENCOUNTER — Other Ambulatory Visit: Payer: Self-pay | Admitting: Family Medicine

## 2018-09-03 ENCOUNTER — Encounter (HOSPITAL_COMMUNITY): Payer: Self-pay | Admitting: Emergency Medicine

## 2018-09-03 ENCOUNTER — Other Ambulatory Visit: Payer: Self-pay

## 2018-09-03 ENCOUNTER — Emergency Department (HOSPITAL_COMMUNITY): Payer: Self-pay

## 2018-09-03 ENCOUNTER — Emergency Department (HOSPITAL_COMMUNITY)
Admission: EM | Admit: 2018-09-03 | Discharge: 2018-09-03 | Disposition: A | Discharge status: Home / Self Care | Payer: Self-pay | Attending: Emergency Medicine | Admitting: Emergency Medicine

## 2018-09-03 DIAGNOSIS — I2510 Atherosclerotic heart disease of native coronary artery without angina pectoris: Secondary | ICD-10-CM | POA: Insufficient documentation

## 2018-09-03 DIAGNOSIS — I252 Old myocardial infarction: Secondary | ICD-10-CM | POA: Insufficient documentation

## 2018-09-03 DIAGNOSIS — L723 Sebaceous cyst: Secondary | ICD-10-CM | POA: Insufficient documentation

## 2018-09-03 DIAGNOSIS — R569 Unspecified convulsions: Secondary | ICD-10-CM | POA: Insufficient documentation

## 2018-09-03 DIAGNOSIS — Z87891 Personal history of nicotine dependence: Secondary | ICD-10-CM | POA: Insufficient documentation

## 2018-09-03 DIAGNOSIS — N189 Chronic kidney disease, unspecified: Secondary | ICD-10-CM | POA: Insufficient documentation

## 2018-09-03 LAB — CBC WITH DIFFERENTIAL/PLATELET
Abs Immature Granulocytes: 0.03 10*3/uL (ref 0.00–0.07)
BASOS ABS: 0.1 10*3/uL (ref 0.0–0.1)
BASOS PCT: 1 %
Eosinophils Absolute: 0.1 10*3/uL (ref 0.0–0.5)
Eosinophils Relative: 1 %
HCT: 46.1 % (ref 39.0–52.0)
Hemoglobin: 14.9 g/dL (ref 13.0–17.0)
IMMATURE GRANULOCYTES: 0 %
LYMPHS ABS: 2.1 10*3/uL (ref 0.7–4.0)
Lymphocytes Relative: 21 %
MCH: 28.7 pg (ref 26.0–34.0)
MCHC: 32.3 g/dL (ref 30.0–36.0)
MCV: 88.8 fL (ref 80.0–100.0)
Monocytes Absolute: 0.9 10*3/uL (ref 0.1–1.0)
Monocytes Relative: 9 %
NEUTROS PCT: 68 %
Neutro Abs: 6.8 10*3/uL (ref 1.7–7.7)
PLATELETS: 305 10*3/uL (ref 150–400)
RBC: 5.19 MIL/uL (ref 4.22–5.81)
RDW: 12.6 % (ref 11.5–15.5)
WBC: 10 10*3/uL (ref 4.0–10.5)
nRBC: 0 % (ref 0.0–0.2)

## 2018-09-03 LAB — BASIC METABOLIC PANEL
ANION GAP: 9 (ref 5–15)
BUN: 9 mg/dL (ref 6–20)
CALCIUM: 8.7 mg/dL — AB (ref 8.9–10.3)
CO2: 25 mmol/L (ref 22–32)
Chloride: 105 mmol/L (ref 98–111)
Creatinine, Ser: 1.02 mg/dL (ref 0.61–1.24)
GFR calc non Af Amer: >60 mL/min (ref >60)
Glucose, Bld: 162 mg/dL — ABNORMAL HIGH (ref 70–99)
Potassium: 3.6 mmol/L (ref 3.5–5.1)
SODIUM: 139 mmol/L (ref 135–145)

## 2018-09-03 MED ORDER — IOHEXOL 300 MG/ML  SOLN
75.0000 mL | Freq: Once | INTRAMUSCULAR | Status: AC | PRN
  Administered 2018-09-03: 75 mL via INTRAVENOUS

## 2018-09-03 MED ORDER — CLINDAMYCIN HCL 150 MG PO CAPS: ORAL_CAPSULE | ORAL | 0 refills | Status: DC

## 2018-09-03 NOTE — ED Triage Notes (Signed)
Patient c/o "knot on the back of neck". Per patient has had for 2 years and "was the size of a pea." Patient states "Nobody has ever been able to tell me what it was but it double in size over night and hurts to the touch." Area warm and tender to touch. Denies any fevers or drainage. Patient anxious because cancer runs in family.

## 2018-09-03 NOTE — ED Notes (Addendum)
This note charted in error.

## 2018-09-03 NOTE — Discharge Instructions (Addendum)
Take the prescription as directed.  Call your regular medical doctor and the General Surgeon on Monday to schedule a follow up appointment within the next week.  Return to the Emergency Department immediately sooner if worsening.

## 2018-09-03 NOTE — ED Provider Notes (Signed)
[facility]Y [facility] Provider Note   CSN: [id number] Arrival date & time: 09/03/18  1626     History   Chief Complaint Chief Complaint  Patient presents with  . Cyst    HPI John Wallace is a 57 y.o. male.  HPI  Pt was seen at 1705. Per pt, c/o gradual onset and worsening of persistent "cyst" on the back of his neck for the past 2 years. Pt states since yesterday the area "has gotten bigger." Pt states "no one has ever told me what it was" but then states he has not told any medical provider about it. Denies fevers, no drainage, no injury, no rash, no sore throat, no focal motor weakness, no tingling/numbness in extremities.    Past Medical History:  Diagnosis Date  . Chronic kidney disease   . Coronary artery disease   . Diverticulosis   . GERD (gastroesophageal reflux disease)    no medical treatment  . Gout   . Myocardial infarction (HCC)    mild Heart attack 2002  . Obesity   . Renal stone    passed   . Seizures (HCC)    as a child    Patient Active Problem List   Diagnosis Date Noted  . Gout 02/10/2017  . Abdominal pain 09/17/2015  . Constipation 09/17/2015  . Mucosal abnormality of stomach   . Hiatal hernia   . Dysphagia   . Mucosal abnormality of colon   . GERD (gastroesophageal reflux disease) 07/22/2015  . Esophageal dysphagia 07/22/2015  . Fatty liver 07/22/2015  . Family history of colonic polyps 07/22/2015  . ANKLE PAIN, RIGHT 03/19/2008    Past Surgical History:  Procedure Laterality Date  . APPENDECTOMY    . BACK SURGERY     x5 surgeries  . BIOPSY N/A 08/07/2015   Procedure: Gastric Biopsies;  Surgeon: Corbin Adeobert M Rourk, MD;  Location: [facility];  Service: Endoscopy;  Laterality: N/A;  . CARDIAC CATHETERIZATION    . COLONOSCOPY  2003   RMR: Normal appearing colonic mucosa to 35-40 cm. Spasm versus persistent swelling and noncompliance of the colon along with patient's discomfort precluded completion of exam.  . COLONOSCOPY WITH  PROPOFOL N/A 08/07/2015   RMR: Status post segmental colonic resection. Focally abnormal appeearing colonic mucosa of doubtful clinical significance status post biopsy.   . ESOPHAGEAL DILATION N/A 08/07/2015   Procedure: ESOPHAGEAL DILATION;  Surgeon: Corbin Adeobert M Rourk, MD;  Location: [facility];  Service: Endoscopy;  Laterality: N/A;  #56 maloney dilator  . ESOPHAGOGASTRODUODENOSCOPY (EGD) WITH PROPOFOL N/A 08/07/2015   RMR: Normal esophagus- status post maloney dilation. Hiatal hernia. Abnormal stomach as described status post biopsy.   Marland Kitchen. PARTIAL COLECTOMY     Dr Bradford:diverticulosis  . throat biop     benign        Home Medications    Prior to Admission medications   Medication Sig Start Date End Date Taking? Authorizing Provider  indomethacin (INDOCIN) 25 MG capsule TAKE ONE CAPSULE BY MOUTH THREE TIMES A DAY AS NEEDED Patient taking differently: Take 25 mg by mouth 3 (three) times daily as needed for mild pain or moderate pain (for gout flares).  05/05/18  Yes Stacks, Broadus JohnWarren, MD  pantoprazole (PROTONIX) 40 MG tablet TAKE ONE TABLET BY MOUTH DAILY **MUST CALL MD FOR APPOINTMENT Patient taking differently: Take 40 mg by mouth every morning.  08/16/18  Yes Mechele ClaudeStacks, Warren, MD  diclofenac (VOLTAREN) 50 MG EC tablet Take 1 tablet (50 mg total) by mouth  2 (two) times daily. Patient not taking: Reported on 09/03/2018 07/27/18   Junie Spencer, FNP    Family History Family History  Problem Relation Age of Onset  . Other Unknown        doesn't know family history    Social History Social History   Tobacco Use  . Smoking status: Never Smoker  . Smokeless tobacco: Former Neurosurgeon    Types: Chew  Substance Use Topics  . Alcohol use: No    Alcohol/week: 0.0 standard drinks    Comment: none since 2013, not regular drinker before but would occasional binging  . Drug use: No     Allergies   Patient has no known allergies.   Review of Systems Review of Systems ROS: Statement: All  systems negative except as marked or noted in the HPI; Constitutional: Negative for fever and chills. ; ; Eyes: Negative for eye pain, redness and discharge. ; ; ENMT: Negative for ear pain, hoarseness, nasal congestion, sinus pressure and sore throat. ; ; Cardiovascular: Negative for chest pain, palpitations, diaphoresis, dyspnea and peripheral edema. ; ; Respiratory: Negative for cough, wheezing and stridor. ; ; Gastrointestinal: Negative for nausea, vomiting, diarrhea, abdominal pain, blood in stool, hematemesis, jaundice and rectal bleeding. . ; ; Genitourinary: Negative for dysuria, flank pain and hematuria. ; ; Musculoskeletal: Negative for back pain and neck pain. Negative for swelling and trauma.; ; Skin: Negative for pruritus, rash, abrasions, blisters, bruising and +skin lesion.; ; Neuro: Negative for headache, lightheadedness and neck stiffness. Negative for weakness, altered level of consciousness, altered mental status, extremity weakness, paresthesias, involuntary movement, seizure and syncope.       Physical Exam Updated Vital Signs BP 122/83   Pulse 83   Temp 98.1 F (36.7 C) (Oral)   Resp 17   Ht 5\' 8"  (1.727 m)   Wt 109.8 kg   SpO2 95%   BMI 36.80 kg/m   Physical Exam 1710: Physical examination:  Nursing notes reviewed; Vital signs and O2 SAT reviewed;  Constitutional: Well developed, Well nourished, Well hydrated, In no acute distress; Head:  Normocephalic, atraumatic; Eyes: EOMI, PERRL, No scleral icterus; ENMT: Mouth and pharynx normal, Mucous membranes moist. Mouth and pharynx without lesions. No tonsillar exudates. No intra-oral edema. No submandibular or sublingual edema. No hoarse voice, no drooling, no stridor. No pain with manipulation of larynx. No trismus.;;; Neck: Supple, Full range of motion, No lymphadenopathy. +approximately 2-3cm small palp nodule posterior neck without erythema, ecchymosis, fluctuance, open wounds, or central pointing area..; Cardiovascular:  Regular rate and rhythm, No gallop; Respiratory: Breath sounds clear & equal bilaterally, No wheezes.  Speaking full sentences with ease, Normal respiratory effort/excursion; Chest: Nontender, Movement normal; Abdomen: Soft, Nontender, Nondistended, Normal bowel sounds; Genitourinary: No CVA tenderness; Extremities: Peripheral pulses normal, No tenderness, No edema, No calf edema or asymmetry.; Neuro: AA&Ox3, Major CN grossly intact.  Speech clear. No gross focal motor or sensory deficits in extremities.; Skin: Color normal, Warm, Dry.   ED Treatments / Results  Labs (all labs ordered are listed, but only abnormal results are displayed)   EKG None  Radiology   Procedures Procedures (including critical care time)  Medications Ordered in ED Medications  iohexol (OMNIPAQUE) 300 MG/ML solution 75 mL (75 mLs Intravenous Contrast Given 09/03/18 1805)     Initial Impression / Assessment and Plan / ED Course  I have reviewed the triage vital signs and the nursing notes.  Pertinent labs & imaging results that were available during my care  of the patient were reviewed by me and considered in my medical decision making (see chart for details).  MDM Reviewed: previous chart, nursing note and vitals Reviewed previous: labs Interpretation: labs and CT scan   Results for orders placed or performed during the hospital encounter of 09/03/18  Basic metabolic panel  Result Value Ref Range   Sodium 139 135 - 145 mmol/L   Potassium 3.6 3.5 - 5.1 mmol/L   Chloride 105 98 - 111 mmol/L   CO2 25 22 - 32 mmol/L   Glucose, Bld 162 (H) 70 - 99 mg/dL   BUN 9 6 - 20 mg/dL   Creatinine, Ser 1.61 0.61 - 1.24 mg/dL   Calcium 8.7 (L) 8.9 - 10.3 mg/dL   GFR calc non Af Amer >60 >60 mL/min   GFR calc Af Amer >60 >60 mL/min   Anion gap 9 5 - 15  CBC with Differential  Result Value Ref Range   WBC 10.0 4.0 - 10.5 K/uL   RBC 5.19 4.22 - 5.81 MIL/uL   Hemoglobin 14.9 13.0 - 17.0 g/dL   HCT 09.6 04.5 -  40.9 %   MCV 88.8 80.0 - 100.0 fL   MCH 28.7 26.0 - 34.0 pg   MCHC 32.3 30.0 - 36.0 g/dL   RDW 81.1 91.4 - 78.2 %   Platelets 305 150 - 400 K/uL   nRBC 0.0 0.0 - 0.2 %   Neutrophils Relative % 68 %   Neutro Abs 6.8 1.7 - 7.7 K/uL   Lymphocytes Relative 21 %   Lymphs Abs 2.1 0.7 - 4.0 K/uL   Monocytes Relative 9 %   Monocytes Absolute 0.9 0.1 - 1.0 K/uL   Eosinophils Relative 1 %   Eosinophils Absolute 0.1 0.0 - 0.5 K/uL   Basophils Relative 1 %   Basophils Absolute 0.1 0.0 - 0.1 K/uL   Immature Granulocytes 0 %   Abs Immature Granulocytes 0.03 0.00 - 0.07 K/uL   Ct Soft Tissue Neck W Contrast Result Date: 09/03/2018 CLINICAL DATA:  Posterior neck mass for 2 years, acutely enlarging and painful. EXAM: CT NECK WITH CONTRAST TECHNIQUE: Multidetector CT imaging of the neck was performed using the standard protocol following the bolus administration of intravenous contrast. CONTRAST:  75mL OMNIPAQUE IOHEXOL 300 MG/ML  SOLN COMPARISON:  CT cervical spine October 25, 2009 FINDINGS: PHARYNX AND LARYNX: Normal.  Widely patent airway. SALIVARY GLANDS: Normal. THYROID: Normal. LYMPH NODES: No lymphadenopathy by CT size criteria. VASCULAR: Minimal calcific atherosclerosis LEFT carotid bifurcation. LIMITED INTRACRANIAL: Normal. VISUALIZED ORBITS: Normal. MASTOIDS AND VISUALIZED PARANASAL SINUSES: Mild maxillary sinus mucosal thickening. SKELETON: Nonacute. LEFT maxillary molar periapical abscess. Degenerative change of the cervical spine resulting in severe bilateral C5-6 and C6-7 neural foraminal narrowing. Moderate C6-7 canal stenosis. UPPER CHEST: Lung apices are clear. No superior mediastinal lymphadenopathy. OTHER: 11 mm LEFT posterior neck cyst with mild inflammatory changes underlying marker indicating palpable abnormality. IMPRESSION: 1. Mildly inflamed small posterior neck sebaceous cyst. 2. No acute process anterior neck. 3. 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 Electronically Signed   By: Awilda Metro M.D.   On: 09/03/2018 18:26     1900:  Workup as above. Pt states he feels reassured (states he was worried it was cancer). Will tx with abx, f/u General Surgeon. Dx and testing d/w pt and family.  Questions answered.  Verb understanding, agreeable to d/c home with outpt f/u.   Final Clinical Impressions(s) / ED Diagnoses  Final diagnoses:  Sebaceous cyst    ED Discharge Orders    None       Samuel Jester, DO 09/07/18 1609

## 2018-09-24 ENCOUNTER — Other Ambulatory Visit: Payer: Self-pay | Admitting: Family Medicine

## 2018-10-06 ENCOUNTER — Ambulatory Visit: Payer: Self-pay | Admitting: Family Medicine

## 2018-10-24 ENCOUNTER — Encounter: Payer: Self-pay | Admitting: Family Medicine

## 2018-10-24 ENCOUNTER — Ambulatory Visit (INDEPENDENT_AMBULATORY_CARE_PROVIDER_SITE_OTHER): Payer: Self-pay | Admitting: Family Medicine

## 2018-10-24 VITALS — BP 132/81 | HR 69 | Temp 97.4°F | Ht 68.0 in | Wt 247.5 lb

## 2018-10-24 DIAGNOSIS — K449 Diaphragmatic hernia without obstruction or gangrene: Secondary | ICD-10-CM

## 2018-10-24 DIAGNOSIS — K219 Gastro-esophageal reflux disease without esophagitis: Secondary | ICD-10-CM

## 2018-10-24 DIAGNOSIS — Z13220 Encounter for screening for lipoid disorders: Secondary | ICD-10-CM

## 2018-10-24 DIAGNOSIS — M1A9XX0 Chronic gout, unspecified, without tophus (tophi): Secondary | ICD-10-CM

## 2018-10-24 MED ORDER — PANTOPRAZOLE SODIUM 40 MG PO TBEC: 40.0000 mg | DELAYED_RELEASE_TABLET | Freq: Every morning | ORAL | 11 refills | Status: DC

## 2018-10-24 MED ORDER — INDOMETHACIN 25 MG PO CAPS: 25.0000 mg | ORAL_CAPSULE | Freq: Three times a day (TID) | ORAL | 11 refills | Status: DC | PRN

## 2018-10-24 NOTE — Progress Notes (Signed)
Subjective:  Patient ID: John Wallace, male    DOB: [birth year]  Age: 58 y.o. MRN: [id number]  CC: Medical Management of Chronic Issues (pt here today for routine follow up of his chronic medical conditions and also c/o right shoulder pain)   HPI OSMEL DYKSTRA presents for Patient in for follow-up of GERD. Currently asymptomatic taking  PPI daily. There is no chest pain or heartburn. No hematemesis and no melena. No dysphagia or choking. Onset is remote. Progression is stable. Complicating factors, none. Patient also has history of gout for which he takes indomethacin as needed.  He has not needed in quite some time.  He is currently having some right shoulder pain.  This is been going on for some time his begin given prednisone on 2 occasions and that has not helped either time.  Depression screen [facility] 2/9 10/24/2018 07/27/2018 11/28/2017  Decreased Interest 0 0 0  Down, Depressed, Hopeless 0 0 0  PHQ - 2 Score 0 0 0    History Truong has a past medical history of Chronic kidney disease, Coronary artery disease, Diverticulosis, GERD (gastroesophageal reflux disease), Gout, Myocardial infarction ([place]), Obesity, Renal stone, and Seizures ([place]).   He has a past surgical history that includes Partial colectomy; Cardiac catheterization; throat biop; Appendectomy; Back surgery; Colonoscopy (2003); Colonoscopy with propofol (N/A, 08/07/2015); Esophagogastroduodenoscopy (egd) with propofol (N/A, 08/07/2015); Esophageal dilation (N/A, 08/07/2015); and biopsy (N/A, 08/07/2015).   His family history includes Other in his unknown relative.He reports that he has never smoked. He has quit using smokeless tobacco.  His smokeless tobacco use included chew. He reports that he does not drink alcohol or use drugs.    ROS Review of Systems  Constitutional: Negative for activity change and fever.  HENT: Negative.   Respiratory: Negative for shortness of breath.   Cardiovascular: Negative for chest pain.   Musculoskeletal: Positive for arthralgias.  Skin: Negative for rash.    Objective:  BP 132/81   Pulse 69   Temp (!) 97.4 F (36.3 C) (Oral)   Ht '5\' 8"'$  (1.727 m)   Wt 247 lb 8 oz (112.3 kg)   BMI 37.63 kg/m   BP Readings from Last 3 Encounters:  10/24/18 132/81  09/03/18 125/81  07/27/18 134/80    Wt Readings from Last 3 Encounters:  10/24/18 247 lb 8 oz (112.3 kg)  09/03/18 242 lb (109.8 kg)  07/27/18 252 lb (114.3 kg)     Physical Exam Vitals signs reviewed.  Constitutional:      Appearance: He is well-developed.  HENT:     Head: Normocephalic and atraumatic.     Right Ear: Tympanic membrane and external ear normal. No decreased hearing noted.     Left Ear: Tympanic membrane and external ear normal. No decreased hearing noted.     Mouth/Throat:     Pharynx: No oropharyngeal exudate or posterior oropharyngeal erythema.  Eyes:     Pupils: Pupils are equal, round, and reactive to light.  Neck:     Musculoskeletal: Normal range of motion and neck supple.  Cardiovascular:     Rate and Rhythm: Normal rate and regular rhythm.     Heart sounds: No murmur.  Pulmonary:     Effort: No respiratory distress.     Breath sounds: Normal breath sounds.  Abdominal:     General: Bowel sounds are normal.     Palpations: Abdomen is soft. There is no mass.     Tenderness: There is no abdominal tenderness.  Musculoskeletal:        General: Tenderness (Noted at right upper extremity at the superior border of the trapezius worse with abduction from 30 to 90 degrees.  Also with external rotation.) present.  Skin:    General: Skin is warm and dry.       Assessment & Plan:   Constantinos was seen today for medical management of chronic issues.  Diagnoses and all orders for this visit:  Gastroesophageal reflux disease, esophagitis presence not specified  Hiatal hernia  Chronic gout without tophus, unspecified cause, unspecified site -     CBC with Differential/Platelet -      CMP14+EGFR -     Uric acid  Screening for cholesterol level -     Lipid panel  Other orders -     indomethacin (INDOCIN) 25 MG capsule; Take 1 capsule (25 mg total) by mouth 3 (three) times daily as needed for mild pain or moderate pain (for gout flares). -     pantoprazole (PROTONIX) 40 MG tablet; Take 1 tablet (40 mg total) by mouth every morning.       I have discontinued Modena Jansky. Sorg's diclofenac and clindamycin. I have also changed his pantoprazole. Additionally, I am having him maintain his indomethacin.  Allergies as of 10/24/2018   No Known Allergies     Medication List       Accurate as of October 24, 2018  9:36 PM. Always use your most recent med list.        indomethacin 25 MG capsule Commonly known as:  INDOCIN Take 1 capsule (25 mg total) by mouth 3 (three) times daily as needed for mild pain or moderate pain (for gout flares).   pantoprazole 40 MG tablet Commonly known as:  PROTONIX Take 1 tablet (40 mg total) by mouth every morning.        Follow-up: Return in about 6 months (around 04/24/2019).  Claretta Fraise, M.D.

## 2018-10-24 NOTE — Patient Instructions (Signed)
Shoulder Exercises Ask your health care provider which exercises are safe for you. Do exercises exactly as told by your health care provider and adjust them as directed. It is normal to feel mild stretching, pulling, tightness, or discomfort as you do these exercises, but you should stop right away if you feel sudden pain or your pain gets worse.Do not begin these exercises until told by your health care provider. Range of Motion Exercises        These exercises warm up your muscles and joints and improve the movement and flexibility of your shoulder. These exercises also help to relieve pain, numbness, and tingling. These exercises involve stretching your injured shoulder directly. Exercise A: Pendulum 1. Stand near a wall or a surface that you can hold onto for balance. 2. Bend at the waist and let your left / right arm hang straight down. Use your other arm to support you. Keep your back straight and do not lock your knees. 3. Relax your left / right arm and shoulder muscles, and move your hips and your trunk so your left / right arm swings freely. Your arm should swing because of the motion of your body, not because you are using your arm or shoulder muscles. 4. Keep moving your body so your arm swings in the following directions, as told by your health care provider: ? Side to side. ? Forward and backward. ? In clockwise and counterclockwise circles. 5. Continue each motion for __________ seconds, or for as long as told by your health care provider. 6. Slowly return to the starting position. Repeat __________ times. Complete this exercise __________ times a day. Exercise B:Flexion, Standing 1. Stand and hold a broomstick, a cane, or a similar object. Place your hands a little more than shoulder-width apart on the object. Your left / right hand should be palm-up, and your other hand should be palm-down. 2. Keep your elbow straight and keep your shoulder muscles relaxed. Push the stick  down with your healthy arm to raise your left / right arm in front of your body, and then over your head until you feel a stretch in your shoulder. ? Avoid shrugging your shoulder while you raise your arm. Keep your shoulder blade tucked down toward the middle of your back. 3. Hold for __________ seconds. 4. Slowly return to the starting position. Repeat __________ times. Complete this exercise __________ times a day. Exercise C: Abduction, Standing 1. Stand and hold a broomstick, a cane, or a similar object. Place your hands a little more than shoulder-width apart on the object. Your left / right hand should be palm-up, and your other hand should be palm-down. 2. While keeping your elbow straight and your shoulder muscles relaxed, push the stick across your body toward your left / right side. Raise your left / right arm to the side of your body and then over your head until you feel a stretch in your shoulder. ? Do not raise your arm above shoulder height, unless your health care provider tells you to do that. ? Avoid shrugging your shoulder while you raise your arm. Keep your shoulder blade tucked down toward the middle of your back. 3. Hold for __________ seconds. 4. Slowly return to the starting position. Repeat __________ times. Complete this exercise __________ times a day. Exercise D:Internal Rotation 1. Place your left / right hand behind your back, palm-up. 2. Use your other hand to dangle an exercise band, a towel, or a similar object over your shoulder.   Grasp the band with your left / right hand so you are holding onto both ends. 3. Gently pull up on the band until you feel a stretch in the front of your left / right shoulder. ? Avoid shrugging your shoulder while you raise your arm. Keep your shoulder blade tucked down toward the middle of your back. 4. Hold for __________ seconds. 5. Release the stretch by letting go of the band and lowering your hands. Repeat __________ times.  Complete this exercise __________ times a day. Stretching Exercises  These exercises warm up your muscles and joints and improve the movement and flexibility of your shoulder. These exercises also help to relieve pain, numbness, and tingling. These exercises are done using your healthy shoulder to help stretch the muscles of your injured shoulder. Exercise E: Corner Stretch (External Rotation and Abduction) 1. Stand in a doorway with one of your feet slightly in front of the other. This is called a staggered stance. If you cannot reach your forearms to the door frame, stand facing a corner of a room. 2. Choose one of the following positions as told by your health care provider: ? Place your hands and forearms on the door frame above your head. ? Place your hands and forearms on the door frame at the height of your head. ? Place your hands on the door frame at the height of your elbows. 3. Slowly move your weight onto your front foot until you feel a stretch across your chest and in the front of your shoulders. Keep your head and chest upright and keep your abdominal muscles tight. 4. Hold for __________ seconds. 5. To release the stretch, shift your weight to your back foot. Repeat __________ times. Complete this stretch __________ times a day. Exercise F:Extension, Standing 1. Stand and hold a broomstick, a cane, or a similar object behind your back. ? Your hands should be a little wider than shoulder-width apart. ? Your palms should face away from your back. 2. Keeping your elbows straight and keeping your shoulder muscles relaxed, move the stick away from your body until you feel a stretch in your shoulder. ? Avoid shrugging your shoulders while you move the stick. Keep your shoulder blade tucked down toward the middle of your back. 3. Hold for __________ seconds. 4. Slowly return to the starting position. Repeat __________ times. Complete this exercise __________ times a  day. Strengthening Exercises           These exercises build strength and endurance in your shoulder. Endurance is the ability to use your muscles for a long time, even after they get tired. Exercise G:External Rotation 1. Sit in a stable chair without armrests. 2. Secure an exercise band at elbow height on your left / right side. 3. Place a soft object, such as a folded towel or a small pillow, between your left / right upper arm and your body to move your elbow a few inches away (about 10 cm) from your side. 4. Hold the end of the band so it is tight and there is no slack. 5. Keeping your elbow pressed against the soft object, move your left / right forearm out, away from your abdomen. Keep your body steady so only your forearm moves. 6. Hold for __________ seconds. 7. Slowly return to the starting position. Repeat __________ times. Complete this exercise __________ times a day. Exercise H:Shoulder Abduction 1. Sit in a stable chair without armrests, or stand. 2. Hold a __________ weight in your   left / right hand, or hold an exercise band with both hands. 3. Start with your arms straight down and your left / right palm facing in, toward your body. 4. Slowly lift your left / right hand out to your side. Do not lift your hand above shoulder height unless your health care provider tells you that this is safe. ? Keep your arms straight. ? Avoid shrugging your shoulder while you do this movement. Keep your shoulder blade tucked down toward the middle of your back. 5. Hold for __________ seconds. 6. Slowly lower your arm, and return to the starting position. Repeat __________ times. Complete this exercise __________ times a day. Exercise I:Shoulder Extension 1. Sit in a stable chair without armrests, or stand. 2. Secure an exercise band to a stable object in front of you where it is at shoulder height. 3. Hold one end of the exercise band in each hand. Your palms should face each  other. 4. Straighten your elbows and lift your hands up to shoulder height. 5. Step back, away from the secured end of the exercise band, until the band is tight and there is no slack. 6. Squeeze your shoulder blades together as you pull your hands down to the sides of your thighs. Stop when your hands are straight down by your sides. Do not let your hands go behind your body. 7. Hold for __________ seconds. 8. Slowly return to the starting position. Repeat __________ times. Complete this exercise __________ times a day. Exercise J:Standing Shoulder Row 1. Sit in a stable chair without armrests, or stand. 2. Secure an exercise band to a stable object in front of you so it is at waist height. 3. Hold one end of the exercise band in each hand. Your palms should be in a thumbs-up position. 4. Bend each of your elbows to an "L" shape (about 90 degrees) and keep your upper arms at your sides. 5. Step back until the band is tight and there is no slack. 6. Slowly pull your elbows back behind you. 7. Hold for __________ seconds. 8. Slowly return to the starting position. Repeat __________ times. Complete this exercise __________ times a day. Exercise K:Shoulder Press-Ups 1. Sit in a stable chair that has armrests. Sit upright, with your feet flat on the floor. 2. Put your hands on the armrests so your elbows are bent and your fingers are pointing forward. Your hands should be about even with the sides of your body. 3. Push down on the armrests and use your arms to lift yourself off of the chair. Straighten your elbows and lift yourself up as much as you comfortably can. ? Move your shoulder blades down, and avoid letting your shoulders move up toward your ears. ? Keep your feet on the ground. As you get stronger, your feet should support less of your body weight as you lift yourself up. 4. Hold for __________ seconds. 5. Slowly lower yourself back into the chair. Repeat __________ times. Complete  this exercise __________ times a day. Exercise L: Wall Push-Ups 1. Stand so you are facing a stable wall. Your feet should be about one arm-length away from the wall. 2. Lean forward and place your palms on the wall at shoulder height. 3. Keep your feet flat on the floor as you bend your elbows and lean forward toward the wall. 4. Hold for __________ seconds. 5. Straighten your elbows to push yourself back to the starting position. Repeat __________ times. Complete this exercise __________ times   a day. This information is not intended to replace advice given to you by your health care provider. Make sure you discuss any questions you have with your health care provider. Document Released: 08/11/2005 Document Revised: 01/31/2018 Document Reviewed: 06/08/2015 Elsevier Interactive Patient Education  2019 [employer]  

## 2018-10-25 ENCOUNTER — Telehealth: Payer: Self-pay | Admitting: Family Medicine

## 2018-10-25 ENCOUNTER — Other Ambulatory Visit: Payer: Self-pay | Admitting: Family Medicine

## 2018-10-25 LAB — CBC WITH DIFFERENTIAL/PLATELET
Basophils Absolute: 0.1 10*3/uL (ref 0.0–0.2)
Basos: 1 %
EOS (ABSOLUTE): 0.1 10*3/uL (ref 0.0–0.4)
Eos: 2 %
Hematocrit: 41.6 % (ref 37.5–51.0)
Hemoglobin: 14 g/dL (ref 13.0–17.7)
Immature Grans (Abs): 0 10*3/uL (ref 0.0–0.1)
Immature Granulocytes: 0 %
LYMPHS ABS: 2.2 10*3/uL (ref 0.7–3.1)
Lymphs: 27 %
MCH: 28.7 pg (ref 26.6–33.0)
MCHC: 33.7 g/dL (ref 31.5–35.7)
MCV: 85 fL (ref 79–97)
MONOS ABS: 0.6 10*3/uL (ref 0.1–0.9)
Monocytes: 7 %
NEUTROS ABS: 5.2 10*3/uL (ref 1.4–7.0)
Neutrophils: 63 %
PLATELETS: 271 10*3/uL (ref 150–450)
RBC: 4.87 x10E6/uL (ref 4.14–5.80)
RDW: 12.1 % (ref 11.6–15.4)
WBC: 8.2 10*3/uL (ref 3.4–10.8)

## 2018-10-25 LAB — CMP14+EGFR
A/G RATIO: 1.3 (ref 1.2–2.2)
ALT: 25 IU/L (ref 0–44)
AST: 17 IU/L (ref 0–40)
Albumin: 3.9 g/dL (ref 3.5–5.5)
Alkaline Phosphatase: 75 IU/L (ref 39–117)
BILIRUBIN TOTAL: 0.4 mg/dL (ref 0.0–1.2)
BUN/Creatinine Ratio: 14 (ref 9–20)
BUN: 12 mg/dL (ref 6–24)
CO2: 25 mmol/L (ref 20–29)
Calcium: 8.9 mg/dL (ref 8.7–10.2)
Chloride: 99 mmol/L (ref 96–106)
Creatinine, Ser: 0.86 mg/dL (ref 0.76–1.27)
GFR calc Af Amer: 111 mL/min/{1.73_m2} (ref >59)
GFR calc non Af Amer: 96 mL/min/{1.73_m2} (ref >59)
GLOBULIN, TOTAL: 2.9 g/dL (ref 1.5–4.5)
GLUCOSE: 145 mg/dL — AB (ref 65–99)
POTASSIUM: 4 mmol/L (ref 3.5–5.2)
SODIUM: 139 mmol/L (ref 134–144)
Total Protein: 6.8 g/dL (ref 6.0–8.5)

## 2018-10-25 LAB — LIPID PANEL
CHOLESTEROL TOTAL: 195 mg/dL (ref 100–199)
Chol/HDL Ratio: 4.4 ratio (ref 0.0–5.0)
HDL: 44 mg/dL (ref >39)
LDL Calculated: 131 mg/dL — ABNORMAL HIGH (ref 0–99)
TRIGLYCERIDES: 99 mg/dL (ref 0–149)
VLDL Cholesterol Cal: 20 mg/dL (ref 5–40)

## 2018-10-25 LAB — URIC ACID: Uric Acid: 8 mg/dL (ref 3.7–8.6)

## 2018-10-25 MED ORDER — NABUMETONE 500 MG PO TABS: 1000.0000 mg | ORAL_TABLET | Freq: Two times a day (BID) | ORAL | 1 refills | Status: DC

## 2018-10-25 NOTE — Telephone Encounter (Signed)
I sent in the prescription, but I decided on one that is a bit better than the one we discussed. It is taken one or two twice a day - just follow the label.

## 2018-10-25 NOTE — Telephone Encounter (Signed)
Patient aware, script is ready. 

## 2018-10-26 ENCOUNTER — Other Ambulatory Visit: Payer: Self-pay | Admitting: *Deleted

## 2018-10-26 DIAGNOSIS — R7309 Other abnormal glucose: Secondary | ICD-10-CM

## 2018-10-28 LAB — HGB A1C W/O EAG: HEMOGLOBIN A1C: 7.9 % — AB (ref 4.8–5.6)

## 2018-10-29 ENCOUNTER — Other Ambulatory Visit: Payer: Self-pay | Admitting: Family Medicine

## 2018-10-29 MED ORDER — METFORMIN HCL ER 500 MG PO TB24: 500.0000 mg | ORAL_TABLET | Freq: Every day | ORAL | 2 refills | Status: DC

## 2018-10-31 ENCOUNTER — Telehealth: Payer: Self-pay | Admitting: Family Medicine

## 2018-10-31 NOTE — Telephone Encounter (Signed)
Patient aware and appointment scheduled.  

## 2018-11-01 ENCOUNTER — Ambulatory Visit (INDEPENDENT_AMBULATORY_CARE_PROVIDER_SITE_OTHER): Payer: Self-pay | Admitting: Family Medicine

## 2018-11-01 ENCOUNTER — Encounter: Payer: Self-pay | Admitting: Family Medicine

## 2018-11-01 VITALS — BP 146/78 | HR 76 | Temp 96.8°F | Ht 68.0 in | Wt 245.5 lb

## 2018-11-01 DIAGNOSIS — E119 Type 2 diabetes mellitus without complications: Secondary | ICD-10-CM

## 2018-11-01 NOTE — Patient Instructions (Signed)
Carbohydrate Counting for Diabetes Mellitus, Adult  Carbohydrate counting is a method of keeping track of how many carbohydrates you eat. Eating carbohydrates naturally increases the amount of sugar (glucose) in the blood. Counting how many carbohydrates you eat helps keep your blood glucose within normal limits, which helps you manage your diabetes (diabetes mellitus). It is important to know how many carbohydrates you can safely have in each meal. This is different for every person. A diet and nutrition specialist (registered dietitian) can help you make a meal plan and calculate how many carbohydrates you should have at each meal and snack. Carbohydrates are found in the following foods:  Grains, such as breads and cereals.  Dried beans and soy products.  Starchy vegetables, such as potatoes, peas, and corn.  Fruit and fruit juices.  Milk and yogurt.  Sweets and snack foods, such as cake, cookies, candy, chips, and soft drinks. How do I count carbohydrates? There are two ways to count carbohydrates in food. You can use either of the methods or a combination of both. Reading "Nutrition Facts" on packaged food The "Nutrition Facts" list is included on the labels of almost all packaged foods and beverages in the [place] It includes:  The serving size.  Information about nutrients in each serving, including the grams (g) of carbohydrate per serving. To use the "Nutrition Facts":  Decide how many servings you will have.  Multiply the number of servings by the number of carbohydrates per serving.  The resulting number is the total amount of carbohydrates that you will be having. Learning standard serving sizes of other foods When you eat carbohydrate foods that are not packaged or do not include "Nutrition Facts" on the label, you need to measure the servings in order to count the amount of carbohydrates:  Measure the foods that you will eat with a food scale or measuring cup, if needed.   Decide how many standard-size servings you will eat.  Multiply the number of servings by 15. Most carbohydrate-rich foods have about 15 g of carbohydrates per serving. ? For example, if you eat 8 oz (170 g) of strawberries, you will have eaten 2 servings and 30 g of carbohydrates (2 servings x 15 g = 30 g).  For foods that have more than one food mixed, such as soups and casseroles, you must count the carbohydrates in each food that is included. The following list contains standard serving sizes of common carbohydrate-rich foods. Each of these servings has about 15 g of carbohydrates:   hamburger bun or  English muffin.   oz (15 mL) syrup.   oz (14 g) jelly.  1 slice of bread.  1 six-inch tortilla.  3 oz (85 g) cooked rice or pasta.  4 oz (113 g) cooked dried beans.  4 oz (113 g) starchy vegetable, such as peas, corn, or potatoes.  4 oz (113 g) hot cereal.  4 oz (113 g) mashed potatoes or  of a large baked potato.  4 oz (113 g) canned or frozen fruit.  4 oz (120 mL) fruit juice.  4-6 crackers.  6 chicken nuggets.  6 oz (170 g) unsweetened dry cereal.  6 oz (170 g) plain fat-free yogurt or yogurt sweetened with artificial sweeteners.  8 oz (240 mL) milk.  8 oz (170 g) fresh fruit or one small piece of fruit.  24 oz (680 g) popped popcorn. Example of carbohydrate counting Sample meal  3 oz (85 g) chicken breast.  6 oz (170 g)   brown rice.  4 oz (113 g) corn.  8 oz (240 mL) milk.  8 oz (170 g) strawberries with sugar-free whipped topping. Carbohydrate calculation 1. Identify the foods that contain carbohydrates: ? Rice. ? Corn. ? Milk. ? Strawberries. 2. Calculate how many servings you have of each food: ? 2 servings rice. ? 1 serving corn. ? 1 serving milk. ? 1 serving strawberries. 3. Multiply each number of servings by 15 g: ? 2 servings rice x 15 g = 30 g. ? 1 serving corn x 15 g = 15 g. ? 1 serving milk x 15 g = 15 g. ? 1 serving  strawberries x 15 g = 15 g. 4. Add together all of the amounts to find the total grams of carbohydrates eaten: ? 30 g + 15 g + 15 g + 15 g = 75 g of carbohydrates total. Summary  Carbohydrate counting is a method of keeping track of how many carbohydrates you eat.  Eating carbohydrates naturally increases the amount of sugar (glucose) in the blood.  Counting how many carbohydrates you eat helps keep your blood glucose within normal limits, which helps you manage your diabetes.  A diet and nutrition specialist (registered dietitian) can help you make a meal plan and calculate how many carbohydrates you should have at each meal and snack. This information is not intended to replace advice given to you by your health care provider. Make sure you discuss any questions you have with your health care provider. Document Released: 09/27/2005 Document Revised: 04/06/2017 Document Reviewed: 03/10/2016 Elsevier Interactive Patient Education  2019 [employer]  

## 2018-11-05 ENCOUNTER — Encounter (HOSPITAL_COMMUNITY): Payer: Self-pay | Admitting: Emergency Medicine

## 2018-11-05 ENCOUNTER — Other Ambulatory Visit: Payer: Self-pay

## 2018-11-05 ENCOUNTER — Encounter: Payer: Self-pay | Admitting: Family Medicine

## 2018-11-05 ENCOUNTER — Emergency Department (HOSPITAL_COMMUNITY)
Admission: EM | Admit: 2018-11-05 | Discharge: 2018-11-05 | Disposition: A | Discharge status: Home / Self Care | Payer: Self-pay | Attending: Emergency Medicine | Admitting: Emergency Medicine

## 2018-11-05 DIAGNOSIS — M79602 Pain in left arm: Secondary | ICD-10-CM | POA: Insufficient documentation

## 2018-11-05 DIAGNOSIS — I129 Hypertensive chronic kidney disease with stage 1 through stage 4 chronic kidney disease, or unspecified chronic kidney disease: Secondary | ICD-10-CM | POA: Insufficient documentation

## 2018-11-05 DIAGNOSIS — Z79899 Other long term (current) drug therapy: Secondary | ICD-10-CM | POA: Insufficient documentation

## 2018-11-05 DIAGNOSIS — I2510 Atherosclerotic heart disease of native coronary artery without angina pectoris: Secondary | ICD-10-CM | POA: Insufficient documentation

## 2018-11-05 DIAGNOSIS — N189 Chronic kidney disease, unspecified: Secondary | ICD-10-CM | POA: Insufficient documentation

## 2018-11-05 DIAGNOSIS — M5412 Radiculopathy, cervical region: Secondary | ICD-10-CM | POA: Insufficient documentation

## 2018-11-05 MED ORDER — HYDROCODONE-ACETAMINOPHEN 5-325 MG PO TABS: 2.0000 | ORAL_TABLET | ORAL | 0 refills | Status: DC | PRN

## 2018-11-05 NOTE — Progress Notes (Signed)
Chief Complaint  Patient presents with  . Diabetes    pt here today to discuss new onset of diabetes    HPI  Patient presents today for discussion of new diagnosis of diabetes.   PMH: Smoking status noted ROS: Per HPI  Objective: BP (!) 146/78   Pulse 76   Temp (!) 96.8 F (36 C) (Oral)   Ht 5\' 8"  (1.727 m)   Wt 245 lb 8 oz (111.4 kg)   BMI 37.33 kg/m  Gen: NAD, alert, cooperative with exam HEENT: NCAT, EOMI, PERRL CV: RRR, good S1/S2, no murmur Resp: CTABL, no wheezes, non-labored Abd: SNTND, BS present, no guarding or organomegaly Ext: No edema, warm Neuro: Alert and oriented, No gross deficits  Assessment and plan:  1. Diabetes mellitus, new onset (HCC)     30 minutes was spent with the patient. More than 1/2 of which was spent reviewing diabetic care inclding diet, end organ disease risk, medication and exercise. Metformin was ordered. Pt. Started it yesterday.    Follow up one month. Mechele Claude, MD

## 2018-11-05 NOTE — Discharge Instructions (Addendum)
Findings suggestive of possible cervical radiculopathy.  Follow back up with your primary care doctor.  MRI would help sort this out.  MRI is available here Monday through Friday during normal working hours.  Take the hydrocodone as directed.  Prescription provided here tonight.

## 2018-11-05 NOTE — ED Triage Notes (Addendum)
Patient complaining of left arm pain radiating into back of neck x 1 month. Denies injury. States he is being treated for right shoulder pain at [facility].

## 2018-11-05 NOTE — ED Provider Notes (Signed)
[facility] EMERGENCY DEPARTMENT Provider Note   CSN: [id number] Arrival date & time: 11/05/18  2033     History   Chief Complaint Chief Complaint  Patient presents with  . Arm Pain    HPI John Wallace is a 58 y.o. male.  Patient presenting with left neck pain arm pain that is been ongoing for about a month.  Radiates down into his hands and causes some weak feeling with his grip.  And some tingling.  [facility] for this.  Patient's been on anti-inflammatories.  No known injury.  Has had trouble with his right rotator cuff.  Patient had some concern it may be related to rotator cuff on the left side.     Past Medical History:  Diagnosis Date  . Chronic kidney disease   . Coronary artery disease   . Diverticulosis   . GERD (gastroesophageal reflux disease)    no medical treatment  . Gout   . Myocardial infarction (HCC)    mild Heart attack 2002  . Obesity   . Renal stone    passed   . Seizures (HCC)    as a child    Patient Active Problem List   Diagnosis Date Noted  . Gout 02/10/2017  . Mucosal abnormality of stomach   . Hiatal hernia   . Mucosal abnormality of colon   . GERD (gastroesophageal reflux disease) 07/22/2015  . Esophageal dysphagia 07/22/2015  . Fatty liver 07/22/2015  . Family history of colonic polyps 07/22/2015    Past Surgical History:  Procedure Laterality Date  . APPENDECTOMY    . BACK SURGERY     x5 surgeries  . BIOPSY N/A 08/07/2015   Procedure: Gastric Biopsies;  Surgeon: Corbin Ade, MD;  Location: [facility];  Service: Endoscopy;  Laterality: N/A;  . CARDIAC CATHETERIZATION    . COLONOSCOPY  2003   RMR: Normal appearing colonic mucosa to 35-40 cm. Spasm versus persistent swelling and noncompliance of the colon along with patient's discomfort precluded completion of exam.  . COLONOSCOPY WITH PROPOFOL N/A 08/07/2015   RMR: Status post segmental colonic resection. Focally abnormal  appeearing colonic mucosa of doubtful clinical significance status post biopsy.   . ESOPHAGEAL DILATION N/A 08/07/2015   Procedure: ESOPHAGEAL DILATION;  Surgeon: Corbin Ade, MD;  Location: [facility];  Service: Endoscopy;  Laterality: N/A;  #56 maloney dilator  . ESOPHAGOGASTRODUODENOSCOPY (EGD) WITH PROPOFOL N/A 08/07/2015   RMR: Normal esophagus- status post maloney dilation. Hiatal hernia. Abnormal stomach as described status post biopsy.   Marland Kitchen PARTIAL COLECTOMY     Dr Bradford:diverticulosis  . throat biop     benign        Home Medications    Prior to Admission medications   Medication Sig Start Date End Date Taking? Authorizing Provider  HYDROcodone-acetaminophen (NORCO/VICODIN) 5-325 MG tablet Take 2 tablets by mouth every 4 (four) hours as needed. 11/05/18   Vanetta Mulders, MD  indomethacin (INDOCIN) 25 MG capsule Take 1 capsule (25 mg total) by mouth 3 (three) times daily as needed for mild pain or moderate pain (for gout flares). 10/24/18   Mechele Claude, MD  metFORMIN (GLUCOPHAGE-XR) 500 MG 24 hr tablet Take 1 tablet (500 mg total) by mouth daily with breakfast. 10/29/18   Mechele Claude, MD  nabumetone (RELAFEN) 500 MG tablet Take 2 tablets (1,000 mg total) by mouth 2 (two) times daily. For muscle and joint pain 10/25/18   Mechele Claude, MD  pantoprazole (PROTONIX) 40 MG tablet Take 1 tablet (40 mg total) by mouth every morning. 10/24/18   Mechele Claude, MD    Family History Family History  Problem Relation Age of Onset  . Other Other        doesn't know family history    Social History Social History   Tobacco Use  . Smoking status: Never Smoker  . Smokeless tobacco: Former Neurosurgeon    Types: Chew  Substance Use Topics  . Alcohol use: No    Alcohol/week: 0.0 standard drinks    Comment: none since 2013, not regular drinker before but would occasional binging  . Drug use: No     Allergies   Patient has no known allergies.   Review of Systems Review of Systems   Constitutional: Negative for chills and fever.  HENT: Negative for rhinorrhea and sore throat.   Eyes: Negative for visual disturbance.  Respiratory: Negative for cough and shortness of breath.   Cardiovascular: Negative for chest pain and leg swelling.  Gastrointestinal: Negative for abdominal pain, diarrhea, nausea and vomiting.  Genitourinary: Negative for dysuria.  Musculoskeletal: Positive for neck pain. Negative for back pain.  Skin: Negative for rash.  Neurological: Positive for weakness and numbness. Negative for dizziness, light-headedness and headaches.  Hematological: Does not bruise/bleed easily.  Psychiatric/Behavioral: Negative for confusion.     Physical Exam Updated Vital Signs BP (!) 156/96 (BP Location: Right Arm)   Pulse 69   Temp 97.9 F (36.6 C) (Oral)   Resp 18   Ht 1.727 m (5\' 8" )   Wt 110.2 kg   SpO2 96%   BMI 36.95 kg/m   Physical Exam Vitals signs and nursing note reviewed.  Constitutional:      Appearance: He is well-developed.  HENT:     Head: Normocephalic and atraumatic.  Eyes:     Extraocular Movements: Extraocular movements intact.     Conjunctiva/sclera: Conjunctivae normal.     Pupils: Pupils are equal, round, and reactive to light.  Neck:     Musculoskeletal: Normal range of motion and neck supple. No neck rigidity.  Cardiovascular:     Rate and Rhythm: Normal rate and regular rhythm.     Heart sounds: No murmur.  Pulmonary:     Effort: Pulmonary effort is normal. No respiratory distress.     Breath sounds: Normal breath sounds.  Musculoskeletal: Normal range of motion.        General: No swelling.     Comments: Good range of motion at the shoulder no deformity.  Good range of motion elbow wrist and hand.  Radial pulse 2+.  No obvious weakness or sensory deficit currently.  Skin:    General: Skin is warm and dry.     Capillary Refill: Capillary refill takes less than 2 seconds.  Neurological:     General: No focal deficit  present.     Mental Status: He is alert and oriented to person, place, and time.      ED Treatments / Results  Labs (all labs ordered are listed, but only abnormal results are displayed) Labs Reviewed - No data to display  EKG None  Radiology No results found.  Procedures Procedures (including critical care time)  Medications Ordered in ED Medications - No data to display   Initial Impression / Assessment and Plan / ED Course  I have reviewed the triage vital signs and the nursing notes.  Pertinent labs & imaging results that were available during my care of  the patient were reviewed by me and considered in my medical decision making (see chart for details).     Presentation suggestive of may be a left-sided cervical radiculopathy.  Doubt that it is rotator cuff since patient without any increased symptoms with raising his arm above his head.  Patient does not have the money for MRI.  Patient's been on anti-inflammatories.  The pain is been keeping him up at night.  Patient will be given a hydrocodone prepack here tonight and recommend follow back up with primary care doctor.  Patient made aware that MRI is available here if he so chooses Monday through Friday during normal working hours.  Final Clinical Impressions(s) / ED Diagnoses   Final diagnoses:  Left arm pain  Cervical radiculopathy    ED Discharge Orders         Ordered    HYDROcodone-acetaminophen (NORCO/VICODIN) 5-325 MG tablet  Every 4 hours PRN     11/05/18 2150           Vanetta MuldersZackowski, Dreya Buhrman, MD 11/05/18 2155

## 2018-11-06 MED FILL — Hydrocodone-Acetaminophen Tab 5-325 MG: ORAL | Qty: 6 | Status: AC

## 2018-11-08 ENCOUNTER — Encounter: Payer: Self-pay | Attending: Family Medicine | Admitting: Nutrition

## 2018-11-08 ENCOUNTER — Encounter: Payer: Self-pay | Admitting: Nutrition

## 2018-11-08 VITALS — Ht 68.0 in | Wt 245.0 lb

## 2018-11-08 DIAGNOSIS — E118 Type 2 diabetes mellitus with unspecified complications: Secondary | ICD-10-CM | POA: Insufficient documentation

## 2018-11-08 DIAGNOSIS — E1165 Type 2 diabetes mellitus with hyperglycemia: Secondary | ICD-10-CM | POA: Insufficient documentation

## 2018-11-08 DIAGNOSIS — E669 Obesity, unspecified: Secondary | ICD-10-CM

## 2018-11-08 DIAGNOSIS — M1030 Gout due to renal impairment, unspecified site: Secondary | ICD-10-CM

## 2018-11-08 DIAGNOSIS — IMO0002 Reserved for concepts with insufficient information to code with codable children: Secondary | ICD-10-CM

## 2018-11-08 NOTE — Patient Instructions (Addendum)
Goals 1. Eat three meals per day 2. Follow My Plate 3. Eat meals on time 4. Exercise 30 three times per week-check out [employer] Cut out diet sodas. Get A1C to 7% or less.

## 2018-11-08 NOTE — Progress Notes (Signed)
Medical Nutrition Therapy:  Appt start time: 1100 end time:  1200.   Assessment:  Primary concerns today: DIabetes Type 2. PMH: CVD, HTN, Hyperlipidemia, Fatty LIver, Chronic Kidney Disease and numerous Gout Attacks. Lives with his wife. Both do the cooking and shopping.  Had been eating 1 meal per day. Recently changed to eating 3 smaller meals.  Not testig blood sugars yet. Has a [employer] meter and will start using it.  Physical activity; walks at work and starting to walk outside work. Wants to join PF.Marland Kitchen HIstory of  mulitple episodes of Gout. Has numerous  back issues.      Willing to work on making changes with diet and losing weight.  BS reported to be elevated per labs for the past 4 years. A1C done is 7.9% now and hasn't been done before that I am aware of. Current diet has been excessive in calories, fat, CHO contributing to obesity, DM and CVD.       Lab Results  Component Value Date   HGBA1C 7.9 (H) 10/24/2018   CMP Latest Ref Rng & Units 10/24/2018 09/03/2018 02/16/2017  Glucose 65 - 99 mg/dL 235(T) 614(E) 315(Q)  BUN 6 - 24 mg/dL 12 9 13   Creatinine 0.76 - 1.27 mg/dL 0.08 6.76 1.95  Sodium 134 - 144 mmol/L 139 139 139  Potassium 3.5 - 5.2 mmol/L 4.0 3.6 4.3  Chloride 96 - 106 mmol/L 99 105 96  CO2 20 - 29 mmol/L 25 25 25   Calcium 8.7 - 10.2 mg/dL 8.9 0.9(T) 9.2  Total Protein 6.0 - 8.5 g/dL 6.8 - 6.9  Total Bilirubin 0.0 - 1.2 mg/dL 0.4 - 0.3  Alkaline Phos 39 - 117 IU/L 75 - 82  AST 0 - 40 IU/L 17 - 20  ALT 0 - 44 IU/L 25 - 28   Lipid Panel     Component Value Date/Time   CHOL 195 10/24/2018 0847   TRIG 99 10/24/2018 0847   HDL 44 10/24/2018 0847   CHOLHDL 4.4 10/24/2018 0847   CHOLHDL 4.1 03/14/2012 0000   VLDL 29 03/14/2012 0000   LDLCALC 131 (H) 10/24/2018 0847    Preferred Learning Style  No preference indicated   Learning Readiness:  Ready  Change in progress   MEDICATIONS:   DIETARY INTAKE: 24-hr recall:  B ( AM):  Jimmydean breakfast bowls,   Diet Coke Snk ( AM): orange  L ( PM): 1 slice ww bread, protein bar, Coffee- Snk ( PM): banana D ( PM): 2 egg sandwiches on ww bread with lite mayo, Diet coke Snk ( PM):  Beverages: Diet sodas  Usual physical activity: ADL  Estimated energy needs: 1500  calories 170 g carbohydrates 112 g protein 42 g fat  Progress Towards Goal(s):  In progress.   Nutritional Diagnosis:  NB-1.1 Food and nutrition-related knowledge deficit As related to Diabetes.  As evidenced by A1C 7.9%.    Intervention:  Nutrition and Diabetes education provided on My Plate, CHO counting, meal planning, portion sizes, timing of meals, avoiding snacks between meals unless having a low blood sugar, target ranges for A1C and blood sugars, signs/symptoms and treatment of hyper/hypoglycemia, monitoring blood sugars, taking medications as prescribed, benefits of exercising 30 minutes per day and prevention of complications of DM. Reviewed low purine diet. . Goals 1. Eat three meals per day 2. Follow My Plate 3. Eat meals on time 4. Exercise 30 three times per week-check out [employer] Cut out diet sodas. Get A1C to 7% or less.  Teaching Method Utilized:  Visual Auditory Hands on  Handouts given during visit include:  The Plate Method   Meal Plan Card   Barriers to learning/adherence to lifestyle change: none  Demonstrated degree of understanding via:  Teach Back   Monitoring/Evaluation:  Dietary intake, exercise, meal planning , and body weight in 1 month(s).

## 2018-12-01 ENCOUNTER — Encounter: Payer: Self-pay | Admitting: Family Medicine

## 2018-12-01 ENCOUNTER — Ambulatory Visit: Payer: Self-pay | Admitting: Family Medicine

## 2018-12-01 VITALS — BP 138/79 | HR 62 | Temp 97.4°F | Ht 68.0 in | Wt 238.0 lb

## 2018-12-01 DIAGNOSIS — E119 Type 2 diabetes mellitus without complications: Secondary | ICD-10-CM

## 2018-12-01 DIAGNOSIS — G8929 Other chronic pain: Secondary | ICD-10-CM

## 2018-12-01 DIAGNOSIS — M25511 Pain in right shoulder: Secondary | ICD-10-CM

## 2018-12-01 MED ORDER — DICLOFENAC SODIUM 75 MG PO TBEC: 75.0000 mg | DELAYED_RELEASE_TABLET | Freq: Two times a day (BID) | ORAL | 2 refills | Status: DC

## 2018-12-01 NOTE — Patient Instructions (Signed)
Carbohydrate Counting for Diabetes Mellitus, Adult  Carbohydrate counting is a method of keeping track of how many carbohydrates you eat. Eating carbohydrates naturally increases the amount of sugar (glucose) in the blood. Counting how many carbohydrates you eat helps keep your blood glucose within normal limits, which helps you manage your diabetes (diabetes mellitus). It is important to know how many carbohydrates you can safely have in each meal. This is different for every person. A diet and nutrition specialist (registered dietitian) can help you make a meal plan and calculate how many carbohydrates you should have at each meal and snack. Carbohydrates are found in the following foods:  Grains, such as breads and cereals.  Dried beans and soy products.  Starchy vegetables, such as potatoes, peas, and corn.  Fruit and fruit juices.  Milk and yogurt.  Sweets and snack foods, such as cake, cookies, candy, chips, and soft drinks. How do I count carbohydrates? There are two ways to count carbohydrates in food. You can use either of the methods or a combination of both. Reading "Nutrition Facts" on packaged food The "Nutrition Facts" list is included on the labels of almost all packaged foods and beverages in the [place] It includes:  The serving size.  Information about nutrients in each serving, including the grams (g) of carbohydrate per serving. To use the "Nutrition Facts":  Decide how many servings you will have.  Multiply the number of servings by the number of carbohydrates per serving.  The resulting number is the total amount of carbohydrates that you will be having. Learning standard serving sizes of other foods When you eat carbohydrate foods that are not packaged or do not include "Nutrition Facts" on the label, you need to measure the servings in order to count the amount of carbohydrates:  Measure the foods that you will eat with a food scale or measuring cup, if needed.   Decide how many standard-size servings you will eat.  Multiply the number of servings by 15. Most carbohydrate-rich foods have about 15 g of carbohydrates per serving. ? For example, if you eat 8 oz (170 g) of strawberries, you will have eaten 2 servings and 30 g of carbohydrates (2 servings x 15 g = 30 g).  For foods that have more than one food mixed, such as soups and casseroles, you must count the carbohydrates in each food that is included. The following list contains standard serving sizes of common carbohydrate-rich foods. Each of these servings has about 15 g of carbohydrates:   hamburger bun or  English muffin.   oz (15 mL) syrup.   oz (14 g) jelly.  1 slice of bread.  1 six-inch tortilla.  3 oz (85 g) cooked rice or pasta.  4 oz (113 g) cooked dried beans.  4 oz (113 g) starchy vegetable, such as peas, corn, or potatoes.  4 oz (113 g) hot cereal.  4 oz (113 g) mashed potatoes or  of a large baked potato.  4 oz (113 g) canned or frozen fruit.  4 oz (120 mL) fruit juice.  4-6 crackers.  6 chicken nuggets.  6 oz (170 g) unsweetened dry cereal.  6 oz (170 g) plain fat-free yogurt or yogurt sweetened with artificial sweeteners.  8 oz (240 mL) milk.  8 oz (170 g) fresh fruit or one small piece of fruit.  24 oz (680 g) popped popcorn. Example of carbohydrate counting Sample meal  3 oz (85 g) chicken breast.  6 oz (170 g)   brown rice.  4 oz (113 g) corn.  8 oz (240 mL) milk.  8 oz (170 g) strawberries with sugar-free whipped topping. Carbohydrate calculation 1. Identify the foods that contain carbohydrates: ? Rice. ? Corn. ? Milk. ? Strawberries. 2. Calculate how many servings you have of each food: ? 2 servings rice. ? 1 serving corn. ? 1 serving milk. ? 1 serving strawberries. 3. Multiply each number of servings by 15 g: ? 2 servings rice x 15 g = 30 g. ? 1 serving corn x 15 g = 15 g. ? 1 serving milk x 15 g = 15 g. ? 1 serving  strawberries x 15 g = 15 g. 4. Add together all of the amounts to find the total grams of carbohydrates eaten: ? 30 g + 15 g + 15 g + 15 g = 75 g of carbohydrates total. Summary  Carbohydrate counting is a method of keeping track of how many carbohydrates you eat.  Eating carbohydrates naturally increases the amount of sugar (glucose) in the blood.  Counting how many carbohydrates you eat helps keep your blood glucose within normal limits, which helps you manage your diabetes.  A diet and nutrition specialist (registered dietitian) can help you make a meal plan and calculate how many carbohydrates you should have at each meal and snack. This information is not intended to replace advice given to you by your health care provider. Make sure you discuss any questions you have with your health care provider. Document Released: 09/27/2005 Document Revised: 04/06/2017 Document Reviewed: 03/10/2016 Elsevier Interactive Patient Education  2019 [employer]  

## 2018-12-01 NOTE — Progress Notes (Signed)
Subjective:  Patient ID: John Wallace, male    DOB: [birth year]  Age: 58 y.o. MRN: [id number]  CC: Diabetes (pt here today for 1 month follow up of new onset diabetes)   HPI John Wallace presents forFollow-up of diabetes. Patient checks blood sugar at home.  100-130 fasting and 110-150 postprandial No significant hypoglycemic spells noted. Medications reviewed. Pt reports taking them regularly without complication/adverse reaction being reported today.  Patient is seeing the nutritionist through Dr. Isidoro Donning office on a couple of occasions and has follow-up set up.  We discussed fine-tuning the diet he says he has lost 7 pounds and is purchased a elliptical machine and is starting to exercise more.  He is not supposed to eat lettuce according to his GI doctor but the nutritionist says that is all he can eat supposedly.  However, he is trying to follow the recommendations closely as possible.  This includes 45 g of carb 3 times a day/per meal.  He has a good grasp of carbohydrate foods at this time.  He says he loves his chicken livers though and tries to limit that to about a small serving once a month.  History John Wallace has a past medical history of Chronic kidney disease, Coronary artery disease, Diverticulosis, GERD (gastroesophageal reflux disease), Gout, Myocardial infarction (HCC), Obesity, Renal stone, and Seizures (HCC).   He has a past surgical history that includes Partial colectomy; Cardiac catheterization; throat biop; Appendectomy; Back surgery; Colonoscopy (2003); Colonoscopy with propofol (N/A, 08/07/2015); Esophagogastroduodenoscopy (egd) with propofol (N/A, 08/07/2015); Esophageal dilation (N/A, 08/07/2015); and biopsy (N/A, 08/07/2015).   His family history includes Other in an other family member.He reports that he has never smoked. He has quit using smokeless tobacco.  His smokeless tobacco use included chew. He reports that he does not drink alcohol or use drugs.  Current  Outpatient Medications on File Prior to Visit  Medication Sig Dispense Refill  . indomethacin (INDOCIN) 25 MG capsule Take 1 capsule (25 mg total) by mouth 3 (three) times daily as needed for mild pain or moderate pain (for gout flares). 30 capsule 11  . metFORMIN (GLUCOPHAGE-XR) 500 MG 24 hr tablet Take 1 tablet (500 mg total) by mouth daily with breakfast. 30 tablet 2  . pantoprazole (PROTONIX) 40 MG tablet Take 1 tablet (40 mg total) by mouth every morning. 30 tablet 11   No current facility-administered medications on file prior to visit.     ROS Review of Systems  Constitutional: Negative for fever.  Respiratory: Negative for shortness of breath.   Cardiovascular: Negative for chest pain.  Musculoskeletal: Negative for arthralgias.  Skin: Negative for rash.    Objective:  BP 138/79   Pulse 62   Temp (!) 97.4 F (36.3 C) (Oral)   Ht 5\' 8"  (1.727 m)   Wt 238 lb (108 kg)   BMI 36.19 kg/m   BP Readings from Last 3 Encounters:  12/01/18 138/79  11/05/18 (!) 156/96  11/01/18 (!) 146/78    Wt Readings from Last 3 Encounters:  12/01/18 238 lb (108 kg)  11/08/18 245 lb (111.1 kg)  11/05/18 243 lb (110.2 kg)     Physical Exam Vitals signs reviewed.  Constitutional:      Appearance: He is well-developed.  HENT:     Head: Normocephalic and atraumatic.     Right Ear: External ear normal.     Left Ear: External ear normal.     Mouth/Throat:     Pharynx: No oropharyngeal exudate or  posterior oropharyngeal erythema.  Eyes:     Pupils: Pupils are equal, round, and reactive to light.  Neck:     Musculoskeletal: Normal range of motion and neck supple.  Cardiovascular:     Rate and Rhythm: Normal rate and regular rhythm.     Heart sounds: No murmur.  Pulmonary:     Effort: No respiratory distress.     Breath sounds: Normal breath sounds.  Neurological:     Mental Status: He is alert and oriented to person, place, and time.       Assessment & Plan:   Marcos was  seen today for diabetes.  Diagnoses and all orders for this visit:  Diabetes mellitus, new onset (HCC)  Chronic right shoulder pain -     MR Shoulder Right Wo Contrast; Future  Other orders -     diclofenac (VOLTAREN) 75 MG EC tablet; Take 1 tablet (75 mg total) by mouth 2 (two) times daily. For muscle and  Joint pain      I have discontinued Bryan Lemma. Haire's nabumetone and HYDROcodone-acetaminophen. I am also having him start on diclofenac. Additionally, I am having him maintain his indomethacin, pantoprazole, and metFORMIN.  Meds ordered this encounter  Medications  . diclofenac (VOLTAREN) 75 MG EC tablet    Sig: Take 1 tablet (75 mg total) by mouth 2 (two) times daily. For muscle and  Joint pain    Dispense:  60 tablet    Refill:  2   1/2-hour was spent with this patient in direct contact.  More than 50% was spent in counseling primarily regarding diabetic care including nutrition and diet.  Follow-up: Return in about 2 months (around 01/30/2019).  Mechele Claude, M.D.

## 2018-12-13 ENCOUNTER — Encounter: Payer: Self-pay | Attending: Family Medicine | Admitting: Nutrition

## 2018-12-13 ENCOUNTER — Encounter: Payer: Self-pay | Admitting: Nutrition

## 2018-12-13 VITALS — Ht 68.0 in | Wt 239.0 lb

## 2018-12-13 DIAGNOSIS — N183 Chronic kidney disease, stage 3 unspecified: Secondary | ICD-10-CM

## 2018-12-13 DIAGNOSIS — E118 Type 2 diabetes mellitus with unspecified complications: Secondary | ICD-10-CM | POA: Insufficient documentation

## 2018-12-13 DIAGNOSIS — E1165 Type 2 diabetes mellitus with hyperglycemia: Secondary | ICD-10-CM | POA: Insufficient documentation

## 2018-12-13 DIAGNOSIS — E782 Mixed hyperlipidemia: Secondary | ICD-10-CM

## 2018-12-13 DIAGNOSIS — E669 Obesity, unspecified: Secondary | ICD-10-CM

## 2018-12-13 DIAGNOSIS — IMO0002 Reserved for concepts with insufficient information to code with codable children: Secondary | ICD-10-CM

## 2018-12-13 NOTE — Patient Instructions (Addendum)
Goals 1. Increase vegetables to 2 svg with lunch and dinner 2. Keep walking 3. Add 3 pieces of fruit a day.

## 2018-12-13 NOTE — Progress Notes (Signed)
  Medical Nutrition Therapy:  Appt start time: 1000 end time:  1030  Assessment:  Primary concerns today: DIabetes Type 2. PMH: CVD, HTN, Hyperlipidemia, Fatty LIver, Chronic Kidney Disease and numerous Gout Attacks.  FBS are much better.  BS;s are am 106-120 . 30 day avg is 130's.  Has been drinking  More water. Eating meals on time.    Has been walking about 2-5 miles most nights. Has an ellipical he uses. Hasn't had any issues with his gout lately. Making great progress.        Lab Results  Component Value Date   HGBA1C 7.9 (H) 10/24/2018   CMP Latest Ref Rng & Units 10/24/2018 09/03/2018 02/16/2017  Glucose 65 - 99 mg/dL 276(F) 943(Q) 003(L)  BUN 6 - 24 mg/dL 12 9 13   Creatinine 0.76 - 1.27 mg/dL 9.44 4.61 9.01  Sodium 134 - 144 mmol/L 139 139 139  Potassium 3.5 - 5.2 mmol/L 4.0 3.6 4.3  Chloride 96 - 106 mmol/L 99 105 96  CO2 20 - 29 mmol/L 25 25 25   Calcium 8.7 - 10.2 mg/dL 8.9 2.2(U) 9.2  Total Protein 6.0 - 8.5 g/dL 6.8 - 6.9  Total Bilirubin 0.0 - 1.2 mg/dL 0.4 - 0.3  Alkaline Phos 39 - 117 IU/L 75 - 82  AST 0 - 40 IU/L 17 - 20  ALT 0 - 44 IU/L 25 - 28   Lipid Panel     Component Value Date/Time   CHOL 195 10/24/2018 0847   TRIG 99 10/24/2018 0847   HDL 44 10/24/2018 0847   CHOLHDL 4.4 10/24/2018 0847   CHOLHDL 4.1 03/14/2012 0000   VLDL 29 03/14/2012 0000   LDLCALC 131 (H) 10/24/2018 0847    Preferred Learning Style  No preference indicated   Learning Readiness:  Ready  Change in progress   MEDICATIONS:   DIETARY INTAKE: 24-hr recall:  B ( AM):  Cornflakes or oatmeal Snk ( AM): orange  L ( PM): Bologna sandwich with lite mayo, water Snk ( PM): banana D ( PM):1 cup spaghetti, , water Snk ( PM):  Beverages: water 1 diet soda.  Usual physical activity: ADL  Estimated energy needs: 1500  calories 170 g carbohydrates 112 g protein 42 g fat  Progress Towards Goal(s):  In progress.   Nutritional Diagnosis:  NB-1.1 Food and nutrition-related  knowledge deficit As related to Diabetes.  As evidenced by A1C 7.9%.    Intervention:  Nutrition and Diabetes education provided on My Plate, CHO counting, meal planning, portion sizes, timing of meals, avoiding snacks between meals unless having a low blood sugar, target ranges for A1C and blood sugars, signs/symptoms and treatment of hyper/hypoglycemia, monitoring blood sugars, taking medications as prescribed, benefits of exercising 30 minutes per day and prevention of complications of DM. Reviewed low purine diet. . Goals 1. Increase vegetables to 2 svg with lunch and dinner 2. Keep walking 3. Add 3 pieces of fruit a day.  Teaching Method Utilized:  Visual Auditory Hands on  Handouts given during visit include:  The Plate Method   Meal Plan Card   Barriers to learning/adherence to lifestyle change: none  Demonstrated degree of understanding via:  Teach Back   Monitoring/Evaluation:  Dietary intake, exercise, meal planning , and body weight in 3 month(s). F/u A1C at next visit.

## 2018-12-14 ENCOUNTER — Encounter (HOSPITAL_COMMUNITY): Payer: Self-pay

## 2018-12-14 ENCOUNTER — Ambulatory Visit (HOSPITAL_COMMUNITY)
Admission: RE | Admit: 2018-12-14 | Discharge: 2018-12-14 | Disposition: A | Discharge status: Home / Self Care | Payer: Self-pay | Source: Ambulatory Visit | Attending: Family Medicine | Admitting: Family Medicine

## 2018-12-14 DIAGNOSIS — M25511 Pain in right shoulder: Principal | ICD-10-CM

## 2018-12-14 DIAGNOSIS — G8929 Other chronic pain: Secondary | ICD-10-CM

## 2019-02-04 ENCOUNTER — Other Ambulatory Visit: Payer: Self-pay | Admitting: Family Medicine

## 2019-02-07 ENCOUNTER — Other Ambulatory Visit: Payer: Self-pay

## 2019-02-07 ENCOUNTER — Encounter: Payer: Self-pay | Admitting: Family Medicine

## 2019-02-07 ENCOUNTER — Ambulatory Visit (INDEPENDENT_AMBULATORY_CARE_PROVIDER_SITE_OTHER): Payer: Self-pay | Admitting: Family Medicine

## 2019-02-07 DIAGNOSIS — E119 Type 2 diabetes mellitus without complications: Secondary | ICD-10-CM

## 2019-02-07 NOTE — Progress Notes (Signed)
    Subjective:    Patient ID: John Wallace, male    DOB: Sep 17, 1961, 58 y.o.   MRN: [id number]   HPI: John Wallace is a 58 y.o. male presenting for follow-up of diabetes. Patient is checking blood sugar at home. Completed training with dietician. Patient denies symptoms such as polyuria, polydipsia, excessive hunger, nausea No significant hypoglycemic spells noted. Medications as noted below. Taking them regularly without complication/adverse reaction being reported today.  Glucose overall avg. Over last 30 days is 137. Weight down to 234   Depression screen [facility] 2/9 12/01/2018 11/08/2018 11/01/2018 10/24/2018 07/27/2018  Decreased Interest 0 0 0 0 0  Down, Depressed, Hopeless 0 0 0 0 0  PHQ - 2 Score 0 0 0 0 0     Relevant past medical, surgical, family and social history reviewed and updated as indicated.  Interim medical history since our last visit reviewed. Allergies and medications reviewed and updated.  ROS:  Review of Systems  Constitutional: Negative.   HENT: Negative.   Eyes: Negative for visual disturbance.  Respiratory: Negative for cough and shortness of breath.   Cardiovascular: Negative for chest pain and leg swelling.  Gastrointestinal: Negative for abdominal pain, diarrhea, nausea and vomiting.  Genitourinary: Negative for difficulty urinating.  Musculoskeletal: Negative for arthralgias and myalgias.  Skin: Negative for rash.  Neurological: Negative for headaches.  Psychiatric/Behavioral: Negative for sleep disturbance.     Social History   Tobacco Use  Smoking Status Never Smoker  Smokeless Tobacco Former Neurosurgeon  . Types: Chew       Objective:     Wt Readings from Last 3 Encounters:  12/13/18 239 lb (108.4 kg)  12/01/18 238 lb (108 kg)  11/08/18 245 lb (111.1 kg)     Exam deferred. Pt. Harboring due to COVID 19. Phone visit performed.   Assessment & Plan:   1. Diabetes mellitus, new onset (HCC)     No orders of the defined types were  placed in this encounter.   No orders of the defined types were placed in this encounter.     Diagnoses and all orders for this visit:  Diabetes mellitus, new onset (HCC)    Virtual Visit via telephone Note  I discussed the limitations, risks, security and privacy concerns of performing an evaluation and management service by telephone and the availability of in person appointments. The patient was identified with two identifiers. Pt.expressed understanding and agreed to proceed. Pt. Is at home. Dr. Darlyn Read is in his office.  Follow Up Instructions:   I discussed the assessment and treatment plan with the patient. The patient was provided an opportunity to ask questions and all were answered. The patient agreed with the plan and demonstrated an understanding of the instructions.   The patient was advised to call back or seek an in-person evaluation if the symptoms worsen or if the condition fails to improve as anticipated.  Visit started: 8:19 Call ended:  8:25 Total minutes including chart review and phone contact time: 16   Follow up plan: Return in about 3 months (around 05/09/2019).  Mechele Claude, MD [facility]

## 2019-03-11 ENCOUNTER — Other Ambulatory Visit: Payer: Self-pay | Admitting: Family Medicine

## 2019-03-28 ENCOUNTER — Ambulatory Visit: Payer: Self-pay | Admitting: Nutrition

## 2019-05-07 LAB — HM DIABETES EYE EXAM

## 2019-05-16 ENCOUNTER — Ambulatory Visit: Payer: Self-pay | Admitting: Family Medicine

## 2019-05-29 ENCOUNTER — Other Ambulatory Visit: Payer: Self-pay

## 2019-05-29 ENCOUNTER — Emergency Department (HOSPITAL_COMMUNITY): Payer: Self-pay

## 2019-05-29 ENCOUNTER — Emergency Department (HOSPITAL_COMMUNITY)
Admission: EM | Admit: 2019-05-29 | Discharge: 2019-05-29 | Disposition: A | Discharge status: Home / Self Care | Payer: Self-pay | Attending: Emergency Medicine | Admitting: Emergency Medicine

## 2019-05-29 ENCOUNTER — Encounter (HOSPITAL_COMMUNITY): Payer: Self-pay | Admitting: Emergency Medicine

## 2019-05-29 DIAGNOSIS — N189 Chronic kidney disease, unspecified: Secondary | ICD-10-CM | POA: Insufficient documentation

## 2019-05-29 DIAGNOSIS — M5431 Sciatica, right side: Secondary | ICD-10-CM | POA: Insufficient documentation

## 2019-05-29 DIAGNOSIS — Z7984 Long term (current) use of oral hypoglycemic drugs: Secondary | ICD-10-CM | POA: Insufficient documentation

## 2019-05-29 DIAGNOSIS — I252 Old myocardial infarction: Secondary | ICD-10-CM | POA: Insufficient documentation

## 2019-05-29 DIAGNOSIS — Z87891 Personal history of nicotine dependence: Secondary | ICD-10-CM | POA: Insufficient documentation

## 2019-05-29 DIAGNOSIS — I129 Hypertensive chronic kidney disease with stage 1 through stage 4 chronic kidney disease, or unspecified chronic kidney disease: Secondary | ICD-10-CM | POA: Insufficient documentation

## 2019-05-29 DIAGNOSIS — Z79899 Other long term (current) drug therapy: Secondary | ICD-10-CM | POA: Insufficient documentation

## 2019-05-29 DIAGNOSIS — I2510 Atherosclerotic heart disease of native coronary artery without angina pectoris: Secondary | ICD-10-CM | POA: Insufficient documentation

## 2019-05-29 DIAGNOSIS — E1122 Type 2 diabetes mellitus with diabetic chronic kidney disease: Secondary | ICD-10-CM | POA: Insufficient documentation

## 2019-05-29 DIAGNOSIS — I10 Essential (primary) hypertension: Secondary | ICD-10-CM

## 2019-05-29 HISTORY — DX: Type 2 diabetes mellitus without complications: E11.9

## 2019-05-29 MED ORDER — HYDROCODONE-ACETAMINOPHEN 5-325 MG PO TABS: 1.0000 | ORAL_TABLET | ORAL | 0 refills | Status: DC | PRN

## 2019-05-29 MED ORDER — HYDROCODONE-ACETAMINOPHEN 5-325 MG PO TABS
2.0000 | ORAL_TABLET | Freq: Once | ORAL | Status: AC
  Administered 2019-05-29: 2 via ORAL
  Filled 2019-05-29: qty 2

## 2019-05-29 MED ORDER — PREDNISONE 50 MG PO TABS
60.0000 mg | ORAL_TABLET | Freq: Once | ORAL | Status: AC
  Administered 2019-05-29: 60 mg via ORAL
  Filled 2019-05-29: qty 1

## 2019-05-29 MED ORDER — PREDNISONE 20 MG PO TABS: 20.0000 mg | ORAL_TABLET | Freq: Two times a day (BID) | ORAL | 0 refills | Status: DC

## 2019-05-29 NOTE — ED Notes (Signed)
Patient transported to X-ray 

## 2019-05-29 NOTE — Discharge Instructions (Addendum)
Use heat on the sore area of your buttock and leg, to improve the discomfort.  Start taking the prednisone prescription tomorrow morning.  Do not drive or work when taking the narcotic pain medicine, hydrocodone.  Make sure you follow-up with your doctor to discuss blood pressure readings and possible treatment if needed.

## 2019-05-29 NOTE — ED Provider Notes (Signed)
[facility]: [facility] EMERGENCY DEPARTMENT Provider Note   CSN: [id number] Arrival date & time: 05/29/19  2045    History   Chief Complaint Chief Complaint  Patient presents with  . Fall    HPI John Wallace is a 58 y.o. male.     HPI   He presents for evaluation of right buttock pain, initially injured 2 weeks ago then aggravated today while he was bending over to help change a tire.  He felt a snap or cramp in his right buttock.  Then he noticed some discomfort radiating to his right foot.  He denies back pain, abdominal pain, weakness or dizziness.  There is been no fever, nausea, vomiting, or chest pain.  He states he has had several disc operations of the lower back previously.  There are no other known modifying factors.  Past Medical History:  Diagnosis Date  . Chronic kidney disease   . Coronary artery disease   . Diabetes mellitus without complication (HCC)   . Diverticulosis   . GERD (gastroesophageal reflux disease)    no medical treatment  . Gout   . Myocardial infarction (HCC)    mild Heart attack 2002  . Obesity   . Renal stone    passed   . Seizures (HCC)    as a child    Patient Active Problem List   Diagnosis Date Noted  . Diabetes mellitus, new onset (HCC) 02/07/2019  . Gout 02/10/2017  . Mucosal abnormality of stomach   . Hiatal hernia   . Mucosal abnormality of colon   . GERD (gastroesophageal reflux disease) 07/22/2015  . Esophageal dysphagia 07/22/2015  . Fatty liver 07/22/2015  . Family history of colonic polyps 07/22/2015    Past Surgical History:  Procedure Laterality Date  . APPENDECTOMY    . BACK SURGERY     x5 surgeries  . BIOPSY N/A 08/07/2015   Procedure: Gastric Biopsies;  Surgeon: Corbin Adeobert M Rourk, MD;  Location: [facility];  Service: Endoscopy;  Laterality: N/A;  . CARDIAC CATHETERIZATION    . COLONOSCOPY  2003   RMR: Normal appearing colonic mucosa to 35-40 cm. Spasm versus persistent swelling and noncompliance of the colon along  with patient's discomfort precluded completion of exam.  . COLONOSCOPY WITH PROPOFOL N/A 08/07/2015   RMR: Status post segmental colonic resection. Focally abnormal appeearing colonic mucosa of doubtful clinical significance status post biopsy.   . ESOPHAGEAL DILATION N/A 08/07/2015   Procedure: ESOPHAGEAL DILATION;  Surgeon: Corbin Adeobert M Rourk, MD;  Location: [facility];  Service: Endoscopy;  Laterality: N/A;  #56 maloney dilator  . ESOPHAGOGASTRODUODENOSCOPY (EGD) WITH PROPOFOL N/A 08/07/2015   RMR: Normal esophagus- status post maloney dilation. Hiatal hernia. Abnormal stomach as described status post biopsy.   Marland Kitchen. PARTIAL COLECTOMY     Dr Bradford:diverticulosis  . throat biop     benign        Home Medications    Prior to Admission medications   Medication Sig Start Date End Date Taking? Authorizing Provider  diclofenac (VOLTAREN) 75 MG EC tablet Take 1 tablet (75 mg total) by mouth 2 (two) times daily. For muscle and  Joint pain 12/01/18  Yes Stacks, Broadus JohnWarren, MD  indomethacin (INDOCIN) 25 MG capsule Take 1 capsule (25 mg total) by mouth 3 (three) times daily as needed for mild pain or moderate pain (for gout flares). 10/24/18  Yes Stacks, Broadus JohnWarren, MD  metFORMIN (GLUCOPHAGE-XR) 500 MG 24 hr tablet TAKE ONE TABLET BY MOUTH DAILY WITH BREAKFAST Patient taking  differently: Take 500 mg by mouth daily with breakfast.  03/12/19  Yes Stacks, Broadus JohnWarren, MD  pantoprazole (PROTONIX) 40 MG tablet Take 1 tablet (40 mg total) by mouth every morning. 10/24/18  Yes Stacks, Broadus JohnWarren, MD  HYDROcodone-acetaminophen (NORCO) 5-325 MG tablet Take 1 tablet by mouth every 4 (four) hours as needed for moderate pain. 05/29/19   Mancel BaleWentz, Zanai Mallari, MD  predniSONE (DELTASONE) 20 MG tablet Take 1 tablet (20 mg total) by mouth 2 (two) times daily. 05/29/19   Mancel BaleWentz, Soloman Mckeithan, MD    Family History Family History  Problem Relation Age of Onset  . Other Other        doesn't know family history    Social History Social History    Tobacco Use  . Smoking status: Never Smoker  . Smokeless tobacco: Former NeurosurgeonUser    Types: Chew  Substance Use Topics  . Alcohol use: No    Alcohol/week: 0.0 standard drinks    Comment: none since 2013, not regular drinker before but would occasional binging  . Drug use: No     Allergies   Patient has no known allergies.   Review of Systems Review of Systems  All other systems reviewed and are negative.    Physical Exam Updated Vital Signs BP (!) 163/98 (BP Location: Left Arm)   Pulse 70   Temp 98.3 F (36.8 C)   Resp 16   Ht 5\' 8"  (1.727 m)   Wt 106.6 kg   SpO2 95%   BMI 35.73 kg/m   Physical Exam Vitals signs and nursing note reviewed.  Constitutional:      General: He is not in acute distress.    Appearance: Normal appearance. He is well-developed. He is not ill-appearing, toxic-appearing or diaphoretic.  HENT:     Head: Normocephalic and atraumatic.     Right Ear: External ear normal.     Left Ear: External ear normal.  Eyes:     Conjunctiva/sclera: Conjunctivae normal.     Pupils: Pupils are equal, round, and reactive to light.  Neck:     Musculoskeletal: Normal range of motion and neck supple.     Trachea: Phonation normal.  Cardiovascular:     Rate and Rhythm: Normal rate.  Pulmonary:     Effort: Pulmonary effort is normal.  Abdominal:     Palpations: Abdomen is soft.     Tenderness: There is no abdominal tenderness.  Musculoskeletal:        General: No swelling or tenderness.     Comments: No tenderness or mass in the right buttock.  Skin:    General: Skin is warm and dry.  Neurological:     Mental Status: He is alert and oriented to person, place, and time.     Cranial Nerves: No cranial nerve deficit.     Sensory: No sensory deficit.     Motor: No abnormal muscle tone.     Coordination: Coordination normal.  Psychiatric:        Mood and Affect: Mood normal.        Behavior: Behavior normal.        Thought Content: Thought content normal.         Judgment: Judgment normal.      ED Treatments / Results  Labs (all labs ordered are listed, but only abnormal results are displayed) Labs Reviewed - No data to display  EKG None  Radiology Dg Hip Unilat  With Pelvis 2-3 Views Right  Result Date: 05/29/2019 CLINICAL DATA:  Right hip pain. Fall 3 weeks ago landing on buttocks. Felt a pop in right hip today. EXAM: DG HIP (WITH OR WITHOUT PELVIS) 2-3V RIGHT COMPARISON:  Radiograph 03/13/2014 FINDINGS: The cortical margins of the bony pelvis and right hip are intact. No fracture. Pubic symphysis and sacroiliac joints are congruent. Both femoral heads are well-seated in the respective acetabula. IMPRESSION: Negative radiographs of the pelvis and right hip. Electronically Signed   By: Keith Rake M.D.   On: 05/29/2019 21:32    Procedures Procedures (including critical care time)  Medications Ordered in ED Medications  HYDROcodone-acetaminophen (NORCO/VICODIN) 5-325 MG per tablet 2 tablet (2 tablets Oral Given 05/29/19 2224)  predniSONE (DELTASONE) tablet 60 mg (60 mg Oral Given 05/29/19 2224)     Initial Impression / Assessment and Plan / ED Course  I have reviewed the triage vital signs and the nursing notes.  Pertinent labs & imaging results that were available during my care of the patient were reviewed by me and considered in my medical decision making (see chart for details).  Clinical Course as of May 28 2242  Tue May 29, 2019  2218 No fracture or dislocation, images reviewed by me  DG Hip Unilat  With Pelvis 2-3 Views Right [EW]    Clinical Course User Index [EW] Daleen Bo, MD        Patient Vitals for the past 24 hrs:  BP Temp Pulse Resp SpO2 Height Weight  05/29/19 2218 (!) 163/98 - 70 16 95 % - -  05/29/19 2100 (!) 178/100 98.3 F (36.8 C) 70 18 98 % 5\' 8"  (1.727 m) 106.6 kg    10:21 PM Reevaluation with update and discussion. After initial assessment and treatment, an updated evaluation reveals  no change in clinical status, findings discussed with the patient, and all questions were answered. Daleen Bo   Medical Decision Making: Evaluation consistent with sciatica.  Incidental hypertension.  Possibly pain related.  No overt clinical signs for hypertensive urgency.  Doubt fracture, myelopathy.  No indication for the ED evaluation or treatment at this time.  CRITICAL CARE-no Performed by: Daleen Bo   Nursing Notes Reviewed/ Care Coordinated Applicable Imaging Reviewed Interpretation of Laboratory Data incorporated into ED treatment  The patient appears reasonably screened and/or stabilized for discharge and I doubt any other medical condition or other [facility] requiring further screening, evaluation, or treatment in the ED at this time prior to discharge.  Plan: Home Medications-continue usual; Home Treatments-heat therapy; return here if the recommended treatment, does not improve the symptoms; Recommended follow up-PCP, next week as scheduled.     Final Clinical Impressions(s) / ED Diagnoses   Final diagnoses:  Sciatica of right side  Hypertension, unspecified type    ED Discharge Orders         Ordered    predniSONE (DELTASONE) 20 MG tablet  2 times daily     05/29/19 2227    HYDROcodone-acetaminophen (NORCO) 5-325 MG tablet  Every 4 hours PRN     05/29/19 2227           Daleen Bo, MD 05/29/19 2244

## 2019-05-29 NOTE — ED Triage Notes (Signed)
Pt states x3 weeks he fell landing on his butt, but just thought it was a "bruised muscle." States today he was helping someone change a tire and he felt something pop in his "buttcheek" and has had a continuous cramp from his hip to his toes. Pt also c/o of burning sensation.

## 2019-06-05 ENCOUNTER — Encounter: Payer: Self-pay | Admitting: *Deleted

## 2019-06-22 ENCOUNTER — Other Ambulatory Visit: Payer: Self-pay | Admitting: Family Medicine

## 2019-06-26 ENCOUNTER — Other Ambulatory Visit: Payer: Self-pay

## 2019-06-26 ENCOUNTER — Emergency Department (HOSPITAL_COMMUNITY): Payer: Self-pay

## 2019-06-26 ENCOUNTER — Emergency Department (HOSPITAL_COMMUNITY)
Admission: EM | Admit: 2019-06-26 | Discharge: 2019-06-26 | Disposition: A | Discharge status: Home / Self Care | Payer: Self-pay | Attending: Emergency Medicine | Admitting: Emergency Medicine

## 2019-06-26 ENCOUNTER — Encounter (HOSPITAL_COMMUNITY): Payer: Self-pay | Admitting: Emergency Medicine

## 2019-06-26 DIAGNOSIS — I252 Old myocardial infarction: Secondary | ICD-10-CM | POA: Insufficient documentation

## 2019-06-26 DIAGNOSIS — N189 Chronic kidney disease, unspecified: Secondary | ICD-10-CM | POA: Insufficient documentation

## 2019-06-26 DIAGNOSIS — E1122 Type 2 diabetes mellitus with diabetic chronic kidney disease: Secondary | ICD-10-CM | POA: Insufficient documentation

## 2019-06-26 DIAGNOSIS — I2510 Atherosclerotic heart disease of native coronary artery without angina pectoris: Secondary | ICD-10-CM | POA: Insufficient documentation

## 2019-06-26 DIAGNOSIS — Y999 Unspecified external cause status: Secondary | ICD-10-CM | POA: Insufficient documentation

## 2019-06-26 DIAGNOSIS — Y93H2 Activity, gardening and landscaping: Secondary | ICD-10-CM | POA: Insufficient documentation

## 2019-06-26 DIAGNOSIS — X58XXXA Exposure to other specified factors, initial encounter: Secondary | ICD-10-CM | POA: Insufficient documentation

## 2019-06-26 DIAGNOSIS — Z87891 Personal history of nicotine dependence: Secondary | ICD-10-CM | POA: Insufficient documentation

## 2019-06-26 DIAGNOSIS — Y929 Unspecified place or not applicable: Secondary | ICD-10-CM | POA: Insufficient documentation

## 2019-06-26 DIAGNOSIS — S0501XA Injury of conjunctiva and corneal abrasion without foreign body, right eye, initial encounter: Secondary | ICD-10-CM | POA: Insufficient documentation

## 2019-06-26 DIAGNOSIS — Z79899 Other long term (current) drug therapy: Secondary | ICD-10-CM | POA: Insufficient documentation

## 2019-06-26 MED ORDER — TETRACAINE HCL 0.5 % OP SOLN
2.0000 [drp] | Freq: Once | OPHTHALMIC | Status: AC
  Administered 2019-06-26: 2 [drp] via OPHTHALMIC
  Filled 2019-06-26: qty 4

## 2019-06-26 MED ORDER — ONDANSETRON HCL 4 MG PO TABS
4.0000 mg | ORAL_TABLET | Freq: Once | ORAL | Status: AC
  Administered 2019-06-26: 4 mg via ORAL
  Filled 2019-06-26: qty 1

## 2019-06-26 MED ORDER — TOBRAMYCIN 0.3 % OP SOLN
2.0000 [drp] | Freq: Once | OPHTHALMIC | Status: AC
  Administered 2019-06-26: 2 [drp] via OPHTHALMIC
  Filled 2019-06-26: qty 5

## 2019-06-26 MED ORDER — FLUORESCEIN SODIUM 1 MG OP STRP
1.0000 | ORAL_STRIP | Freq: Once | OPHTHALMIC | Status: AC
  Administered 2019-06-26: 1 via OPHTHALMIC
  Filled 2019-06-26: qty 1

## 2019-06-26 MED ORDER — HYDROCODONE-ACETAMINOPHEN 5-325 MG PO TABS
2.0000 | ORAL_TABLET | Freq: Once | ORAL | Status: AC
  Administered 2019-06-26: 2 via ORAL
  Filled 2019-06-26: qty 2

## 2019-06-26 MED ORDER — HYDROCODONE-ACETAMINOPHEN 5-325 MG PO TABS: 1.0000 | ORAL_TABLET | ORAL | 0 refills | Status: DC | PRN

## 2019-06-26 NOTE — Discharge Instructions (Addendum)
Your CT scan is negative for any foreign body inside the eye.  Your eye examination shows a corneal abrasion.  Please use 2 drops of tobramycin/Tobrex to the right eye every 4 hours for the next 5 days.  Please use dark glasses to prevent so much pain with change in bright lights in low lights.  Use Tylenol every 4 hours for mild pain, may use Norco for more severe pain or headache.This medication may cause drowsiness. Please do not drink, drive, or participate in activity that requires concentration while taking this medication.

## 2019-06-26 NOTE — ED Provider Notes (Signed)
[facility] Provider Note   CSN: [id number] Arrival date & time: 06/26/19  1916     History   Chief Complaint Chief Complaint  Patient presents with  . Eye Pain    HPI John Wallace is a 58 y.o. male.     Patient is a 58 year old male who presents to the emergency department with a complaint of pain of the right.  The patient states he was mowing when something flew into his right eye.  He irrigated his eye with water.  He says it still feels as though something is in it and he is having increasing pain.  He has had increasing tearing.  Patient states his tetanus is up-to-date.  He presents now to the emergency department for additional evaluation and management of this issue.  The history is provided by the patient.  Eye Pain Associated symptoms include headaches. Pertinent negatives include no chest pain, no abdominal pain and no shortness of breath.    Past Medical History:  Diagnosis Date  . Chronic kidney disease   . Coronary artery disease   . Diabetes mellitus without complication (HCC)   . Diverticulosis   . GERD (gastroesophageal reflux disease)    no medical treatment  . Gout   . Myocardial infarction (HCC)    mild Heart attack 2002  . Obesity   . Renal stone    passed   . Seizures (HCC)    as a child    Patient Active Problem List   Diagnosis Date Noted  . Diabetes mellitus, new onset (HCC) 02/07/2019  . Gout 02/10/2017  . Mucosal abnormality of stomach   . Hiatal hernia   . Mucosal abnormality of colon   . GERD (gastroesophageal reflux disease) 07/22/2015  . Esophageal dysphagia 07/22/2015  . Fatty liver 07/22/2015  . Family history of colonic polyps 07/22/2015    Past Surgical History:  Procedure Laterality Date  . APPENDECTOMY    . BACK SURGERY     x5 surgeries  . BIOPSY N/A 08/07/2015   Procedure: Gastric Biopsies;  Surgeon: Corbin Adeobert M Rourk, MD;  Location: [facility];  Service: Endoscopy;  Laterality: N/A;  . CARDIAC  CATHETERIZATION    . COLONOSCOPY  2003   RMR: Normal appearing colonic mucosa to 35-40 cm. Spasm versus persistent swelling and noncompliance of the colon along with patient's discomfort precluded completion of exam.  . COLONOSCOPY WITH PROPOFOL N/A 08/07/2015   RMR: Status post segmental colonic resection. Focally abnormal appeearing colonic mucosa of doubtful clinical significance status post biopsy.   . ESOPHAGEAL DILATION N/A 08/07/2015   Procedure: ESOPHAGEAL DILATION;  Surgeon: Corbin Adeobert M Rourk, MD;  Location: [facility];  Service: Endoscopy;  Laterality: N/A;  #56 maloney dilator  . ESOPHAGOGASTRODUODENOSCOPY (EGD) WITH PROPOFOL N/A 08/07/2015   RMR: Normal esophagus- status post maloney dilation. Hiatal hernia. Abnormal stomach as described status post biopsy.   Marland Kitchen. PARTIAL COLECTOMY     Dr Bradford:diverticulosis  . throat biop     benign        Home Medications    Prior to Admission medications   Medication Sig Start Date End Date Taking? Authorizing Provider  diclofenac (VOLTAREN) 75 MG EC tablet Take 1 tablet (75 mg total) by mouth 2 (two) times daily. For muscle and  Joint pain 12/01/18   Mechele ClaudeStacks, Warren, MD  HYDROcodone-acetaminophen (NORCO) 5-325 MG tablet Take 1 tablet by mouth every 4 (four) hours as needed for moderate pain. 05/29/19   Mancel BaleWentz, Elliott, MD  indomethacin (INDOCIN) 25 MG capsule Take 1 capsule (25 mg total) by mouth 3 (three) times daily as needed for mild pain or moderate pain (for gout flares). 10/24/18   Mechele Claude, MD  metFORMIN (GLUCOPHAGE-XR) 500 MG 24 hr tablet Take 1 tablet (500 mg total) by mouth daily with breakfast. (Needs to be seen before next refill) 06/22/19   Mechele Claude, MD  pantoprazole (PROTONIX) 40 MG tablet Take 1 tablet (40 mg total) by mouth every morning. 10/24/18   Mechele Claude, MD  predniSONE (DELTASONE) 20 MG tablet Take 1 tablet (20 mg total) by mouth 2 (two) times daily. 05/29/19   Mancel Bale, MD    Family History Family  History  Problem Relation Age of Onset  . Other Other        doesn't know family history    Social History Social History   Tobacco Use  . Smoking status: Never Smoker  . Smokeless tobacco: Former Neurosurgeon    Types: Chew  Substance Use Topics  . Alcohol use: No    Alcohol/week: 0.0 standard drinks    Comment: none since 2013, not regular drinker before but would occasional binging  . Drug use: No     Allergies   Patient has no known allergies.   Review of Systems Review of Systems  Constitutional: Negative for activity change and appetite change.  HENT: Negative for congestion, ear discharge, ear pain, facial swelling, nosebleeds, rhinorrhea, sneezing and tinnitus.   Eyes: Positive for photophobia, pain and redness. Negative for discharge.  Respiratory: Negative for cough, choking, shortness of breath and wheezing.   Cardiovascular: Negative for chest pain, palpitations and leg swelling.  Gastrointestinal: Negative for abdominal pain, blood in stool, constipation, diarrhea, nausea and vomiting.  Genitourinary: Negative for difficulty urinating, dysuria, flank pain, frequency and hematuria.  Musculoskeletal: Negative for back pain, gait problem, myalgias and neck pain.  Skin: Negative for color change, rash and wound.  Neurological: Positive for headaches. Negative for dizziness, seizures, syncope, facial asymmetry, speech difficulty, weakness and numbness.  Hematological: Negative for adenopathy. Does not bruise/bleed easily.  Psychiatric/Behavioral: Negative for agitation, confusion, hallucinations, self-injury and suicidal ideas. The patient is not nervous/anxious.      Physical Exam Updated Vital Signs BP (!) 170/101   Pulse 67   Temp 98.1 F (36.7 C)   Resp 18   Ht 5\' 8"  (1.727 m)   Wt 111.1 kg   SpO2 98%   BMI 37.25 kg/m   Physical Exam Eyes:     General: Lids are everted, no foreign bodies appreciated.        Right eye: No discharge or hordeolum.         Left eye: No discharge or hordeolum.     Pupils: Pupils are equal, round, and reactive to light.     Right eye: Fluorescein uptake present.     Funduscopic exam:    Right eye: No hemorrhage, exudate or papilledema.        Left eye: No hemorrhage, exudate or papilledema.     Slit lamp exam:    Right eye: Photophobia present. No foreign body, hyphema, hypopyon or anterior chamber flares.     Left eye: No foreign body, hyphema, hypopyon or anterior chamber flares.       ED Treatments / Results  Labs (all labs ordered are listed, but only abnormal results are displayed) Labs Reviewed - No data to display  EKG None  Radiology No results found.  Procedures Procedures (including critical care  time)  Medications Ordered in ED Medications - No data to display   Initial Impression / Assessment and Plan / ED Course  I have reviewed the triage vital signs and the nursing notes.  Pertinent labs & imaging results that were available during my care of the patient were reviewed by me and considered in my medical decision making (see chart for details).          Final Clinical Impressions(s) / ED Diagnoses MDM  Patient was mowing the lawn when something flew in his eye.  He flushed the area with water, but still has a sensation of something is in his eye.  The floor seen examination shows a corneal abrasion at the 6 o'clock position.  The slit lamp exam is negative for blood or fluid in the anterior chamber.  No evidence of hypopyon.  CT scan of the orbits is negative for opaque foreign body in the globe or in the orbit area.  Patient will be treated with tobramycin ophthalmic drops.  Of asked the patient to use dark glasses to assist with some of the photophobia.  Patient will use Tylenol every 4 hours for mild pain.  He will use hydrocodone for more severe pain and headache.  The patient is to follow-up with ophthalmology or return to the emergency department if not improving.   Patient is in agreement with this plan   Final diagnoses:  Abrasion of right cornea, initial encounter    ED Discharge Orders         Ordered    HYDROcodone-acetaminophen (NORCO/VICODIN) 5-325 MG tablet  Every 4 hours PRN     06/26/19 2316           Lily Kocher, PA-C 06/27/19 1051    Milton Ferguson, MD 06/28/19 1223

## 2019-06-26 NOTE — ED Triage Notes (Signed)
Pt c/o right eye pain after something flew in it while mowing today.

## 2019-07-26 ENCOUNTER — Other Ambulatory Visit: Payer: Self-pay | Admitting: Family Medicine

## 2019-10-25 ENCOUNTER — Ambulatory Visit: Payer: Self-pay | Admitting: Family Medicine

## 2019-10-26 ENCOUNTER — Ambulatory Visit: Payer: Self-pay | Admitting: Family Medicine

## 2019-11-24 ENCOUNTER — Other Ambulatory Visit: Payer: Self-pay

## 2019-11-24 ENCOUNTER — Emergency Department (HOSPITAL_COMMUNITY): Payer: Self-pay

## 2019-11-24 ENCOUNTER — Emergency Department (HOSPITAL_COMMUNITY)
Admission: EM | Admit: 2019-11-24 | Discharge: 2019-11-24 | Disposition: A | Discharge status: Home / Self Care | Payer: Self-pay | Attending: Emergency Medicine | Admitting: Emergency Medicine

## 2019-11-24 ENCOUNTER — Encounter (HOSPITAL_COMMUNITY): Payer: Self-pay | Admitting: *Deleted

## 2019-11-24 DIAGNOSIS — Z79899 Other long term (current) drug therapy: Secondary | ICD-10-CM | POA: Insufficient documentation

## 2019-11-24 DIAGNOSIS — N189 Chronic kidney disease, unspecified: Secondary | ICD-10-CM | POA: Insufficient documentation

## 2019-11-24 DIAGNOSIS — Y929 Unspecified place or not applicable: Secondary | ICD-10-CM | POA: Insufficient documentation

## 2019-11-24 DIAGNOSIS — I2510 Atherosclerotic heart disease of native coronary artery without angina pectoris: Secondary | ICD-10-CM | POA: Insufficient documentation

## 2019-11-24 DIAGNOSIS — Y939 Activity, unspecified: Secondary | ICD-10-CM | POA: Insufficient documentation

## 2019-11-24 DIAGNOSIS — W010XXA Fall on same level from slipping, tripping and stumbling without subsequent striking against object, initial encounter: Secondary | ICD-10-CM | POA: Insufficient documentation

## 2019-11-24 DIAGNOSIS — Y999 Unspecified external cause status: Secondary | ICD-10-CM | POA: Insufficient documentation

## 2019-11-24 DIAGNOSIS — Z7984 Long term (current) use of oral hypoglycemic drugs: Secondary | ICD-10-CM | POA: Insufficient documentation

## 2019-11-24 DIAGNOSIS — S52501A Unspecified fracture of the lower end of right radius, initial encounter for closed fracture: Secondary | ICD-10-CM | POA: Insufficient documentation

## 2019-11-24 DIAGNOSIS — E1122 Type 2 diabetes mellitus with diabetic chronic kidney disease: Secondary | ICD-10-CM | POA: Insufficient documentation

## 2019-11-24 MED ORDER — HYDROCODONE-ACETAMINOPHEN 5-325 MG PO TABS
1.0000 | ORAL_TABLET | Freq: Once | ORAL | Status: AC
  Administered 2019-11-24: 19:00:00 1 via ORAL
  Filled 2019-11-24: qty 1

## 2019-11-24 MED ORDER — HYDROCODONE-ACETAMINOPHEN 5-325 MG PO TABS: 2.0000 | ORAL_TABLET | ORAL | 0 refills | Status: DC | PRN

## 2019-11-24 NOTE — ED Provider Notes (Addendum)
[facility] EMERGENCY DEPARTMENT Provider Note   CSN: [id number] Arrival date & time: 11/24/19  1835     History Chief Complaint  Patient presents with  . Arm Pain    John Wallace is a 59 y.o. male with past medical history significant for CKD, diabetes, MI who presents for evaluation after mechanical fall.  Patient states he slipped and fell on the ice.  Patient denies hitting his head, LOC or anticoagulation.  Patient states he fell on a outstretched hand.  Patient complains of right wrist pain which extends into his medial radius and ulna.  He denies any pain to his right hand, right humerus, olecranon or shoulder.  Admits decreased range of motion to his wrist secondary to pain.  States current pain an 8/10.  Describes his pain as throbbing.  No paresthesias, redness, warmth.  Patient states he did have some swelling to his wrist earlier which came down with ibuprofen and ice.  Fall occurred earlier around lunchtime.  Has been ambulatory since incident.  Denies additional rating or alleviating factors.  No fever, chills, rashes, lesions, contusions, abrasions, nausea, vomiting.  History obtained from patient and past medical records.  No interpreter is used.  HPI     Past Medical History:  Diagnosis Date  . Chronic kidney disease   . Coronary artery disease   . Diabetes mellitus without complication (HCC)   . Diverticulosis   . GERD (gastroesophageal reflux disease)    no medical treatment  . Gout   . Myocardial infarction (HCC)    mild Heart attack 2002  . Obesity   . Renal stone    passed   . Seizures (HCC)    as a child    Patient Active Problem List   Diagnosis Date Noted  . Diabetes mellitus, new onset (HCC) 02/07/2019  . Gout 02/10/2017  . Mucosal abnormality of stomach   . Hiatal hernia   . Mucosal abnormality of colon   . GERD (gastroesophageal reflux disease) 07/22/2015  . Esophageal dysphagia 07/22/2015  . Fatty liver 07/22/2015  . Family history of  colonic polyps 07/22/2015    Past Surgical History:  Procedure Laterality Date  . APPENDECTOMY    . BACK SURGERY     x5 surgeries  . BIOPSY N/A 08/07/2015   Procedure: Gastric Biopsies;  Surgeon: Corbin Ade, MD;  Location: [facility];  Service: Endoscopy;  Laterality: N/A;  . CARDIAC CATHETERIZATION    . COLONOSCOPY  2003   RMR: Normal appearing colonic mucosa to 35-40 cm. Spasm versus persistent swelling and noncompliance of the colon along with patient's discomfort precluded completion of exam.  . COLONOSCOPY WITH PROPOFOL N/A 08/07/2015   RMR: Status post segmental colonic resection. Focally abnormal appeearing colonic mucosa of doubtful clinical significance status post biopsy.   . ESOPHAGEAL DILATION N/A 08/07/2015   Procedure: ESOPHAGEAL DILATION;  Surgeon: Corbin Ade, MD;  Location: [facility];  Service: Endoscopy;  Laterality: N/A;  #56 maloney dilator  . ESOPHAGOGASTRODUODENOSCOPY (EGD) WITH PROPOFOL N/A 08/07/2015   RMR: Normal esophagus- status post maloney dilation. Hiatal hernia. Abnormal stomach as described status post biopsy.   Marland Kitchen PARTIAL COLECTOMY     Dr Bradford:diverticulosis  . throat biop     benign       Family History  Problem Relation Age of Onset  . Other Other        doesn't know family history    Social History   Tobacco Use  . Smoking status: Never  Smoker  . Smokeless tobacco: Former Systems developer    Types: Chew  Substance Use Topics  . Alcohol use: No    Alcohol/week: 0.0 standard drinks    Comment: none since 2013, not regular drinker before but would occasional binging  . Drug use: No    Home Medications Prior to Admission medications   Medication Sig Start Date End Date Taking? Authorizing Provider  diclofenac (VOLTAREN) 75 MG EC tablet Take 1 tablet (75 mg total) by mouth 2 (two) times daily. For muscle and  Joint pain 12/01/18   Claretta Fraise, MD  HYDROcodone-acetaminophen (NORCO/VICODIN) 5-325 MG tablet Take 2 tablets by mouth every 4 (four)  hours as needed. 11/24/19   Victoriano Campion A, PA-C  indomethacin (INDOCIN) 25 MG capsule Take 1 capsule (25 mg total) by mouth 3 (three) times daily as needed for mild pain or moderate pain (for gout flares). 10/24/18   Claretta Fraise, MD  metFORMIN (GLUCOPHAGE-XR) 500 MG 24 hr tablet TAKE ONE TABLET BY MOUTH DAILY WITH BREAKFAST; MAKE DOCTOR'S APPOINTMENT FOR FURTHER REFILLS PLEASE 07/26/19   Claretta Fraise, MD  pantoprazole (PROTONIX) 40 MG tablet Take 1 tablet (40 mg total) by mouth every morning. 10/24/18   Claretta Fraise, MD  predniSONE (DELTASONE) 20 MG tablet Take 1 tablet (20 mg total) by mouth 2 (two) times daily. 05/29/19   Daleen Bo, MD    Allergies    Patient has no known allergies.  Review of Systems   Review of Systems  Constitutional: Negative.   HENT: Negative.   Respiratory: Negative.   Cardiovascular: Negative.   Gastrointestinal: Negative.   Genitourinary: Negative.   Musculoskeletal: Negative for back pain, gait problem, joint swelling, myalgias, neck pain and neck stiffness.       Right wrist and forearm pain  Skin: Negative.   Neurological: Negative.   All other systems reviewed and are negative.   Physical Exam Updated Vital Signs BP (!) 159/94 (BP Location: Left Arm)   Pulse 88   Temp 98.4 F (36.9 C) (Oral)   Resp 20   Ht 5\' 8"  (1.727 m)   Wt 111.1 kg   SpO2 96%   BMI 37.24 kg/m   Physical Exam Vitals and nursing note reviewed.  Constitutional:      General: He is not in acute distress.    Appearance: He is well-developed. He is not ill-appearing, toxic-appearing or diaphoretic.  HENT:     Head: Normocephalic and atraumatic.     Nose: Nose normal.     Mouth/Throat:     Mouth: Mucous membranes are moist.     Pharynx: Oropharynx is clear.  Eyes:     Pupils: Pupils are equal, round, and reactive to light.  Cardiovascular:     Rate and Rhythm: Normal rate and regular rhythm.     Pulses: Normal pulses.          Radial pulses are 2+ on the  right side and 2+ on the left side.     Heart sounds: Normal heart sounds.  Pulmonary:     Effort: Pulmonary effort is normal. No respiratory distress.     Breath sounds: Normal breath sounds.  Abdominal:     General: Bowel sounds are normal. There is no distension.     Palpations: Abdomen is soft.  Musculoskeletal:     Right elbow: Normal.     Left elbow: Normal.     Right forearm: Tenderness present. No swelling, edema, deformity or lacerations.     Left forearm:  Normal.     Right wrist: Tenderness, bony tenderness and snuff box tenderness present. No swelling, deformity, lacerations or crepitus. Decreased range of motion. Normal pulse.     Left wrist: Normal.     Right hand: Normal.     Left hand: Normal.     Cervical back: Normal range of motion and neck supple.  Skin:    General: Skin is warm and dry.     Capillary Refill: Capillary refill takes less than 2 seconds.     Comments: Brisk cap refill. No lacerations, contusions, abrasion.  Neurological:     Mental Status: He is alert.     Sensory: Sensation is intact.     Motor: Motor function is intact.     Coordination: Coordination is intact.     Gait: Gait is intact.     Comments: Intact sensation to bilateral upper and lower extremities    ED Results / Procedures / Treatments   Labs (all labs ordered are listed, but only abnormal results are displayed) Labs Reviewed - No data to display  EKG None  Radiology DG Forearm Right  Result Date: 11/24/2019 CLINICAL DATA:  Fall with wrist pain EXAM: RIGHT FOREARM - 2 VIEW COMPARISON:  None. FINDINGS: Acute nondisplaced intra-articular distal radius fracture. Radial head alignment is normal. No definitive elbow effusion. IMPRESSION: Acute nondisplaced intra-articular distal radius fracture. Electronically Signed   By: Jasmine Pang M.D.   On: 11/24/2019 19:05   DG Wrist Complete Right  Result Date: 11/24/2019 CLINICAL DATA:  Right wrist pain after fall EXAM: RIGHT WRIST -  COMPLETE 3+ VIEW COMPARISON:  None. FINDINGS: Acute nondisplaced intra-articular distal radius fracture. No subluxation. Soft tissue swelling is present IMPRESSION: Acute nondisplaced intra-articular distal radius fracture Electronically Signed   By: Jasmine Pang M.D.   On: 11/24/2019 19:03    Procedures .Ortho Injury Treatment  Date/Time: 11/24/2019 7:36 PM Performed by: Linwood Dibbles, PA-C Authorized by: Linwood Dibbles, PA-C   Consent:    Consent obtained:  Verbal   Consent given by:  Patient   Risks discussed:  Fracture, nerve damage, restricted joint movement, vascular damage, stiffness, recurrent dislocation and irreducible dislocation   Alternatives discussed:  Alternative treatment, no treatment, immobilization, referral and delayed treatmentInjury location: forearm Location details: right forearm Injury type: fracture Fracture type: distal radius Pre-procedure neurovascular assessment: neurovascularly intact Pre-procedure distal perfusion: normal Pre-procedure neurological function: normal Pre-procedure range of motion: normal  Anesthesia: Local anesthesia used: no  Patient sedated: NoManipulation performed: no Immobilization: splint and sling Splint type: sugar tong Supplies used: aluminum splint,  cotton padding and elastic bandage Post-procedure neurovascular assessment: post-procedure neurovascularly intact Post-procedure distal perfusion: normal Post-procedure neurological function: normal Post-procedure range of motion: normal Patient tolerance: patient tolerated the procedure well with no immediate complications    (including critical care time)  Medications Ordered in ED Medications  HYDROcodone-acetaminophen (NORCO/VICODIN) 5-325 MG per tablet 1 tablet (1 tablet Oral Given 11/24/19 1912)   ED Course  I have reviewed the triage vital signs and the nursing notes.  Pertinent labs & imaging results that were available during my care of the patient  were reviewed by me and considered in my medical decision making (see chart for details).  59 year old presents for evaluation after mechanical fall and landing on outstretched right hand. NV intact. No overlying skin changes or evidence of open fracture. Decreased ROM to wrist secondary to pain. No bony tenderness to hand, olecranon, humerus or shoulder. Denies hitting head, LOC,  anticoagulation. Plain films with right acute intraarticular radius fracture. Will splint and have close follow up with Orthopedics.  NV intact after splint placement  The patient has been appropriately medically screened and/or stabilized in the ED. I have low suspicion for any other emergent medical condition which would require further screening, evaluation or treatment in the ED or require inpatient management.  Patient is hemodynamically stable and in no acute distress.  Patient able to ambulate in department prior to ED.  Evaluation does not show acute pathology that would require ongoing or additional emergent interventions while in the emergency department or further inpatient treatment.  I have discussed the diagnosis with the patient and answered all questions.  Pain is been managed while in the emergency department and patient has no further complaints prior to discharge.  Patient is comfortable with plan discussed in room and is stable for discharge at this time.  I have discussed strict return precautions for returning to the emergency department.  Patient was encouraged to follow-up with PCP/specialist refer to at discharge.    MDM Rules/Calculators/A&P                      Final Clinical Impression(s) / ED Diagnoses Final diagnoses:  Closed fracture of distal end of right radius, unspecified fracture morphology, initial encounter    Rx / DC Orders ED Discharge Orders         Ordered    HYDROcodone-acetaminophen (NORCO/VICODIN) 5-325 MG tablet  Every 4 hours PRN     11/24/19 1939             Lismary Kiehn A, PA-C 11/24/19 1945    Gerrard Crystal A, PA-C 11/24/19 1945    Mancel Bale, MD 11/24/19 2335

## 2019-11-24 NOTE — Discharge Instructions (Signed)
Take the medication for pain. Follow up with Orthopedics for re-evaluation of you wrist fracture. May also take Ibuprofen for your pain.

## 2019-11-24 NOTE — ED Triage Notes (Signed)
Pt c/o right wrist pain after slipping on the ice today, took ibuprofen earlier for the pain,

## 2019-12-12 ENCOUNTER — Other Ambulatory Visit: Payer: Self-pay | Admitting: Family Medicine

## 2020-03-15 ENCOUNTER — Other Ambulatory Visit: Payer: Self-pay | Admitting: Family Medicine

## 2020-07-26 ENCOUNTER — Encounter (HOSPITAL_COMMUNITY): Payer: Self-pay

## 2020-07-26 ENCOUNTER — Other Ambulatory Visit: Payer: Self-pay

## 2020-07-26 ENCOUNTER — Emergency Department (HOSPITAL_COMMUNITY)
Admission: EM | Admit: 2020-07-26 | Discharge: 2020-07-26 | Disposition: A | Discharge status: Home / Self Care | Payer: 59 | Attending: Emergency Medicine | Admitting: Emergency Medicine

## 2020-07-26 DIAGNOSIS — I2510 Atherosclerotic heart disease of native coronary artery without angina pectoris: Secondary | ICD-10-CM | POA: Diagnosis not present

## 2020-07-26 DIAGNOSIS — M25551 Pain in right hip: Secondary | ICD-10-CM | POA: Diagnosis not present

## 2020-07-26 DIAGNOSIS — Z87891 Personal history of nicotine dependence: Secondary | ICD-10-CM | POA: Insufficient documentation

## 2020-07-26 DIAGNOSIS — N182 Chronic kidney disease, stage 2 (mild): Secondary | ICD-10-CM | POA: Diagnosis not present

## 2020-07-26 DIAGNOSIS — M25561 Pain in right knee: Secondary | ICD-10-CM | POA: Diagnosis present

## 2020-07-26 DIAGNOSIS — M10061 Idiopathic gout, right knee: Secondary | ICD-10-CM | POA: Diagnosis not present

## 2020-07-26 DIAGNOSIS — E1122 Type 2 diabetes mellitus with diabetic chronic kidney disease: Secondary | ICD-10-CM | POA: Insufficient documentation

## 2020-07-26 DIAGNOSIS — Z7984 Long term (current) use of oral hypoglycemic drugs: Secondary | ICD-10-CM | POA: Diagnosis not present

## 2020-07-26 MED ORDER — INDOMETHACIN 50 MG PO CAPS: 50.0000 mg | ORAL_CAPSULE | Freq: Three times a day (TID) | ORAL | 0 refills | Status: AC

## 2020-07-26 MED ORDER — INDOMETHACIN 25 MG PO CAPS
25.0000 mg | ORAL_CAPSULE | Freq: Once | ORAL | Status: AC
  Administered 2020-07-26: 25 mg via ORAL
  Filled 2020-07-26: qty 1

## 2020-07-26 MED ORDER — DEXAMETHASONE SODIUM PHOSPHATE 10 MG/ML IJ SOLN
10.0000 mg | Freq: Once | INTRAMUSCULAR | Status: AC
  Administered 2020-07-26: 10 mg via INTRAMUSCULAR
  Filled 2020-07-26: qty 1

## 2020-07-26 NOTE — ED Triage Notes (Signed)
Pt to er, pt states that he is here for gout, states that it started in his R knee, states that he has since been limping so much it is also in his hip.  Pt states that he has a hx of gout, states that he has had it here before

## 2020-07-26 NOTE — ED Provider Notes (Signed)
[facility]ERGENCY DEPARTMENT Provider Note   CSN: [id number] Arrival date & time: 07/26/20  1112     History Chief Complaint  Patient presents with  . Knee Pain    John Wallace is a 59 y.o. male.  HPI 59 year old male with history of CKD, DM type II, GERD, gout, MI, obesity presents to the ER with complaints of pain in his right knee which she states is consistent with his gout flareups.  He states he noticed some pain with movement to his right knee yesterday, took 1 dose of indomethacin which is normally the medicine he takes for his flareups and the only medicine that works, but states that he ran out and only had 1 dose.  He states that he laid in bed yesterday evening with his leg elevated, and his leg felt a little bit better when he was not moving.  However he woke up around 2 AM with throbbing pain to the right knee even at rest.  He has been able to ambulate, though with pain.  He has been having to limp and now has some mild pain in his right hip.  He denies any falls.  Denies any fevers, chills, excessive redness or swelling, no numbness or tingling in his extremities.  He denies any IV drug use.  States that he has tried to manage his gout with diet but this is not helped him.  He has tried allopurinol for prevention but this is also not been helpful.  He states he normally will receive a steroid shot here and be sent home with indomethacin and his symptoms resolved within several days.  He also has a pending PCP follow-up on Monday.  He has no history of peptic ulcer disease and is not on any blood thinners.    Past Medical History:  Diagnosis Date  . Chronic kidney disease   . Coronary artery disease   . Diabetes mellitus without complication (HCC)   . Diverticulosis   . GERD (gastroesophageal reflux disease)    no medical treatment  . Gout   . Myocardial infarction (HCC)    mild Heart attack 2002  . Obesity   . Renal stone    passed   . Seizures (HCC)    as a  child    Patient Active Problem List   Diagnosis Date Noted  . Diabetes mellitus, new onset (HCC) 02/07/2019  . Gout 02/10/2017  . Mucosal abnormality of stomach   . Hiatal hernia   . Mucosal abnormality of colon   . GERD (gastroesophageal reflux disease) 07/22/2015  . Esophageal dysphagia 07/22/2015  . Fatty liver 07/22/2015  . Family history of colonic polyps 07/22/2015    Past Surgical History:  Procedure Laterality Date  . APPENDECTOMY    . BACK SURGERY     x5 surgeries  . BIOPSY N/A 08/07/2015   Procedure: Gastric Biopsies;  Surgeon: Corbin Adeobert M Rourk, MD;  Location: [facility];  Service: Endoscopy;  Laterality: N/A;  . CARDIAC CATHETERIZATION    . COLONOSCOPY  2003   RMR: Normal appearing colonic mucosa to 35-40 cm. Spasm versus persistent swelling and noncompliance of the colon along with patient's discomfort precluded completion of exam.  . COLONOSCOPY WITH PROPOFOL N/A 08/07/2015   RMR: Status post segmental colonic resection. Focally abnormal appeearing colonic mucosa of doubtful clinical significance status post biopsy.   . ESOPHAGEAL DILATION N/A 08/07/2015   Procedure: ESOPHAGEAL DILATION;  Surgeon: Corbin Adeobert M Rourk, MD;  Location: [facility];  Service:  Endoscopy;  Laterality: N/A;  #56 maloney dilator  . ESOPHAGOGASTRODUODENOSCOPY (EGD) WITH PROPOFOL N/A 08/07/2015   RMR: Normal esophagus- status post maloney dilation. Hiatal hernia. Abnormal stomach as described status post biopsy.   Marland Kitchen PARTIAL COLECTOMY     Dr Bradford:diverticulosis  . throat biop     benign       Family History  Problem Relation Age of Onset  . Other Other        doesn't know family history    Social History   Tobacco Use  . Smoking status: Never Smoker  . Smokeless tobacco: Former Neurosurgeon    Types: Engineer, drilling  . Vaping Use: Never used  Substance Use Topics  . Alcohol use: No    Alcohol/week: 0.0 standard drinks    Comment: none since 2013, not regular drinker before but would  occasional binging  . Drug use: No    Home Medications Prior to Admission medications   Medication Sig Start Date End Date Taking? Authorizing Provider  diclofenac (VOLTAREN) 75 MG EC tablet Take 1 tablet (75 mg total) by mouth 2 (two) times daily. For muscle and  Joint pain 12/01/18   Mechele Claude, MD  HYDROcodone-acetaminophen (NORCO/VICODIN) 5-325 MG tablet Take 2 tablets by mouth every 4 (four) hours as needed. 11/24/19   Henderly, Britni A, PA-C  indomethacin (INDOCIN) 50 MG capsule Take 1 capsule (50 mg total) by mouth 3 (three) times daily with meals for 7 days. Take 50 mg orally 3 times daily until pain is tolerable; rapidly start decreasing based on pain response until finished 07/26/20 08/02/20  Mare Ferrari, PA-C  metFORMIN (GLUCOPHAGE-XR) 500 MG 24 hr tablet TAKE ONE TABLET BY MOUTH DAILY WITH BREAKFAST; MAKE DOCTOR'S APPOINTMENT FOR FURTHER REFILLS PLEASE 07/26/19   Mechele Claude, MD  pantoprazole (PROTONIX) 40 MG tablet Take 1 tablet (40 mg total) by mouth every morning. 10/24/18   Mechele Claude, MD  predniSONE (DELTASONE) 20 MG tablet Take 1 tablet (20 mg total) by mouth 2 (two) times daily. 05/29/19   Mancel Bale, MD    Allergies    Patient has no known allergies.  Review of Systems   Review of Systems  Constitutional: Negative for chills and fever.  Musculoskeletal: Positive for arthralgias. Negative for myalgias and neck stiffness.  Neurological: Negative for weakness and numbness.    Physical Exam Updated Vital Signs BP (!) 142/85 (BP Location: Right Arm)   Pulse 70   Temp 98.4 F (36.9 C) (Oral)   Resp 18   Ht 5\' 8"  (1.727 m)   Wt 115.2 kg   SpO2 95%   BMI 38.62 kg/m   Physical Exam Vitals and nursing note reviewed.  Constitutional:      General: He is not in acute distress.    Appearance: He is well-developed. He is obese. He is not ill-appearing, toxic-appearing or diaphoretic.  HENT:     Head: Normocephalic and atraumatic.  Eyes:      Conjunctiva/sclera: Conjunctivae normal.     Pupils: Pupils are equal, round, and reactive to light.  Cardiovascular:     Rate and Rhythm: Normal rate and regular rhythm.     Pulses: Normal pulses.     Heart sounds: Normal heart sounds. No murmur heard.   Pulmonary:     Effort: Pulmonary effort is normal. No respiratory distress.     Breath sounds: Normal breath sounds.  Abdominal:     Palpations: Abdomen is soft.     Tenderness: There is  no abdominal tenderness.  Musculoskeletal:        General: Swelling and tenderness present. No deformity or signs of injury. Normal range of motion.     Cervical back: Neck supple.     Right lower leg: No edema.     Left lower leg: No edema.     Comments: Right knee with very mild warmth, tenderness to palpation to light touch trace edema.  Full range of motion intact, though with pain.  No significant overlying erythema.  No leg shortening to the right leg, no tenderness to palpation to the right hip.  Patient ambulated in the ED but with a slight limp.  2+ DP pulses.  Gross sensations intact.  Skin:    General: Skin is warm.     Capillary Refill: Capillary refill takes less than 2 seconds.     Findings: No erythema or rash.  Neurological:     General: No focal deficit present.     Mental Status: He is alert and oriented to person, place, and time.  Psychiatric:        Mood and Affect: Mood normal.        Behavior: Behavior normal.     ED Results / Procedures / Treatments   Labs (all labs ordered are listed, but only abnormal results are displayed) Labs Reviewed - No data to display  EKG None  Radiology No results found.  Procedures Procedures (including critical care time)  Medications Ordered in ED Medications  dexamethasone (DECADRON) injection 10 mg (10 mg Intramuscular Given 07/26/20 1158)  indomethacin (INDOCIN) capsule 25 mg (25 mg Oral Given 07/26/20 1157)    ED Course  I have reviewed the triage vital signs and the  nursing notes.  Pertinent labs & imaging results that were available during my care of the patient were reviewed by me and considered in my medical decision making (see chart for details).    MDM Rules/Calculators/A&P                          Pt presents with monoarticular pain, swelling and erythema.  Pt is afebrile and stable.  Given that this is consistent with his previous presentations, do not think he needs any imaging or labs.  Pt without known peptic ulcer disease and not receiving concurrent treatment on warfarin.  Patient denies any injury to the right hip, describes a soreness, do not think any plain films of this is indicated at this time.  No evidence of leg shortening.  Low concern for septic hip joint.  Pt dc with indomethacin (50 mg PO TID). Discussed that pt should respond to treatment with in 24 hour of begining treatment & likely resolve in 2-3 days.  Patient was also given 10 of Decadron here.  Return precautions discussed, he has follow-up with his PCP on Monday, however I did discuss that if his any or hip continue to swell, worsening fevers, chills, or any other concerning symptoms, to return to the ER prior to his appointment.  He voices understanding and is agreeable to this plan.  At this stage in the ED course, the patient has been medically screened and stable for discharge.  Final Clinical Impression(s) / ED Diagnoses Final diagnoses:  Acute idiopathic gout of right knee    Rx / DC Orders ED Discharge Orders         Ordered    indomethacin (INDOCIN) 50 MG capsule  3 times daily with meals  07/26/20 1156           Mare Ferrari, PA-C 07/26/20 1203    Linwood Dibbles, MD 07/27/20 [phone]

## 2020-07-26 NOTE — Discharge Instructions (Addendum)
Please take the prescribed indomethacin as directed.  Make sure to return to the ER for any worsening redness, swelling, fevers, chills or any other new or concerning symptoms.  Please make sure to follow-up with your primary care doctor on Monday.

## 2020-08-11 ENCOUNTER — Encounter: Payer: Self-pay | Admitting: Internal Medicine

## 2020-11-17 ENCOUNTER — Ambulatory Visit (INDEPENDENT_AMBULATORY_CARE_PROVIDER_SITE_OTHER): Payer: 59 | Admitting: Nurse Practitioner

## 2020-11-17 DIAGNOSIS — J029 Acute pharyngitis, unspecified: Secondary | ICD-10-CM | POA: Insufficient documentation

## 2020-11-17 DIAGNOSIS — J00 Acute nasopharyngitis [common cold]: Secondary | ICD-10-CM | POA: Diagnosis not present

## 2020-11-17 DIAGNOSIS — R0989 Other specified symptoms and signs involving the circulatory and respiratory systems: Secondary | ICD-10-CM

## 2020-11-17 MED ORDER — AZITHROMYCIN 250 MG PO TABS: ORAL_TABLET | ORAL | 0 refills | Status: DC

## 2020-11-17 MED ORDER — DM-GUAIFENESIN ER 30-600 MG PO TB12: 1.0000 | ORAL_TABLET | Freq: Two times a day (BID) | ORAL | 0 refills | Status: DC

## 2020-11-17 NOTE — Assessment & Plan Note (Signed)
Symptoms not well controlled.  Started patient on guaifenesin.  Follow-up with worsening or unresolved symptoms.

## 2020-11-17 NOTE — Patient Instructions (Signed)
Pharyngitis  Pharyngitis is a sore throat (pharynx). This is when there is redness, pain, and swelling in your throat. Most of the time, this condition gets better on its own. In some cases, you may need medicine. Follow these instructions at home:  Take over-the-counter and prescription medicines only as told by your doctor. ? If you were prescribed an antibiotic medicine, take it as told by your doctor. Do not stop taking the antibiotic even if you start to feel better. ? Do not give children aspirin. Aspirin has been linked to Reye syndrome.  Drink enough water and fluids to keep your pee (urine) clear or pale yellow.  Get a lot of rest.  Rinse your mouth (gargle) with a salt-water mixture 3-4 times a day or as needed. To make a salt-water mixture, completely dissolve -1 tsp of salt in 1 cup of warm water.  If your doctor approves, you may use throat lozenges or sprays to soothe your throat. Contact a doctor if:  You have large, tender lumps in your neck.  You have a rash.  You cough up green, yellow-brown, or bloody spit. Get help right away if:  You have a stiff neck.  You drool or cannot swallow liquids.  You cannot drink or take medicines without throwing up.  You have very bad pain that does not go away with medicine.  You have problems breathing, and it is not from a stuffy nose.  You have new pain and swelling in your knees, ankles, wrists, or elbows. Summary  Pharyngitis is a sore throat (pharynx). This is when there is redness, pain, and swelling in your throat.  If you were prescribed an antibiotic medicine, take it as told by your doctor. Do not stop taking the antibiotic even if you start to feel better.  Most of the time, pharyngitis gets better on its own. Sometimes, you may need medicine. This information is not intended to replace advice given to you by your health care provider. Make sure you discuss any questions you have with your health care  provider. Document Revised: 09/09/2017 Document Reviewed: 11/02/2016 Elsevier Patient Education  2021 Elsevier Inc.  

## 2020-11-17 NOTE — Assessment & Plan Note (Signed)
Symptoms of pharyngitis headache, chest cold and cough not well controlled.  Patient spouse positive for COVID of been treated in the last week. COVID-19 test ordered for patient with results pending.  Started patient on Z-Pak, guaifenesin for cough and congestion.  Use Tylenol to relieve headache/fever. Follow-up with worsening or unresolved symptoms. Rx sent to pharmacy.

## 2020-11-17 NOTE — Progress Notes (Signed)
   Virtual Visit via telephone Note Due to COVID-19 pandemic this visit was conducted virtually. This visit type was conducted due to national recommendations for restrictions regarding the COVID-19 Pandemic (e.g. social distancing, sheltering in place) in an effort to limit this patient's exposure and mitigate transmission in our community. All issues noted in this document were discussed and addressed.  A physical exam was not performed with this format.  I connected with John Wallace on 11/17/20 at  10:40 AM by telephone and verified that I am speaking with the correct person using two identifiers. John Wallace is currently located at home during visit. The provider, Daryll Drown, NP is located in their office at time of visit.  I discussed the limitations, risks, security and privacy concerns of performing an evaluation and management service by telephone and the availability of in person appointments. I also discussed with the patient that there may be a patient responsible charge related to this service. The patient expressed understanding and agreed to proceed.   History and Present Illness:  Sore Throat  This is a new problem. The current episode started in the past 7 days. The problem has been unchanged. There has been no fever. Associated symptoms include congestion, coughing and headaches. Pertinent negatives include no shortness of breath. He has had no exposure to strep. He has tried nothing for the symptoms. The treatment provided no relief.      Review of Systems  Constitutional: Negative for chills and fever.  HENT: Positive for congestion. Negative for sore throat.   Respiratory: Positive for cough. Negative for shortness of breath.   Skin: Negative for itching and rash.  Neurological: Positive for headaches.  All other systems reviewed and are negative.    Observations/Objective: Televisit-patient does not sound to be in distress  Assessment and  Plan:  Pharyngitis Symptoms of pharyngitis headache, chest cold and cough not well controlled.  Patient spouse positive for COVID of been treated in the last week. COVID-19 test ordered for patient with results pending.  Started patient on Z-Pak, guaifenesin for cough and congestion.  Use Tylenol to relieve headache/fever. Follow-up with worsening or unresolved symptoms. Rx sent to pharmacy.  Chest congestion Symptoms not well controlled.  Started patient on guaifenesin.  Follow-up with worsening or unresolved symptoms.   Follow Up Instructions: Follow-up with worsening or unresolved symptoms.    I discussed the assessment and treatment plan with the patient. The patient was provided an opportunity to ask questions and all were answered. The patient agreed with the plan and demonstrated an understanding of the instructions.   The patient was advised to call back or seek an in-person evaluation if the symptoms worsen or if the condition fails to improve as anticipated.  The above assessment and management plan was discussed with the patient. The patient verbalized understanding of and has agreed to the management plan. Patient is aware to call the clinic if symptoms persist or worsen. Patient is aware when to return to the clinic for a follow-up visit. Patient educated on when it is appropriate to go to the emergency department.   Time call ended: 10:52 AM  I provided 11 minutes of non-face-to-face time during this encounter.    Daryll Drown, NP

## 2020-11-18 LAB — NOVEL CORONAVIRUS, NAA: SARS-CoV-2, NAA: DETECTED — AB

## 2020-11-18 LAB — SARS-COV-2, NAA 2 DAY TAT

## 2020-11-19 ENCOUNTER — Telehealth: Payer: Self-pay | Admitting: Unknown Physician Specialty

## 2020-11-19 NOTE — Telephone Encounter (Signed)
Called to discuss with patient about COVID-19 symptoms and the use of one of the available treatments for those with mild to moderate Covid symptoms and at a high risk of hospitalization.  Pt appears to qualify for outpatient treatment due to co-morbid conditions and/or a member of an at-risk group in accordance with the FDA Emergency Use Authorization.    Symptom onset: ? Vaccinated: ?  Unable to reach pt - [facility]  Gabriel Cirri

## 2020-11-24 ENCOUNTER — Ambulatory Visit: Payer: Self-pay | Admitting: Nurse Practitioner

## 2021-06-04 ENCOUNTER — Ambulatory Visit: Payer: 59 | Admitting: Family Medicine

## 2021-06-10 ENCOUNTER — Encounter: Payer: Self-pay | Admitting: Family Medicine

## 2021-06-10 ENCOUNTER — Ambulatory Visit (INDEPENDENT_AMBULATORY_CARE_PROVIDER_SITE_OTHER): Payer: 59 | Admitting: Family Medicine

## 2021-06-10 ENCOUNTER — Other Ambulatory Visit: Payer: Self-pay

## 2021-06-10 VITALS — BP 162/98 | HR 76 | Temp 97.8°F | Resp 20 | Ht 68.0 in | Wt 246.0 lb

## 2021-06-10 DIAGNOSIS — E669 Obesity, unspecified: Secondary | ICD-10-CM | POA: Diagnosis not present

## 2021-06-10 DIAGNOSIS — E119 Type 2 diabetes mellitus without complications: Secondary | ICD-10-CM

## 2021-06-10 DIAGNOSIS — I10 Essential (primary) hypertension: Secondary | ICD-10-CM | POA: Diagnosis not present

## 2021-06-10 LAB — BAYER DCA HB A1C WAIVED: HB A1C (BAYER DCA - WAIVED): 6.6 % (ref <7.0)

## 2021-06-10 NOTE — Progress Notes (Signed)
Subjective:  Patient ID: John Wallace, male    DOB: 1961/04/30  Age: 60 y.o. MRN: [id number]  CC: Medical Management of Chronic Issues   HPI John Wallace presents forFollow-up of diabetes.  He has been away for a couple of years due to loss of insurance.  However during that time he went from drinking 24 sodas a day to drinking some sugar-free sodas to not drink any symptoms at all.  He has cut back on eating sugars and in fact says he eats no sugar but he does need some sugar-free cookies.  He checks his blood pressure at home and has had normal readings.  At the drugstore the other day his wife said it was a little on the high side though. Over the 2 years since he is last been evaluated John Wallace says that he has stopped all of his medications including his metformin and blood pressure pills.  He has felt better than he has in many years since he may these changes.  History John Wallace has a past medical history of Chronic kidney disease, Coronary artery disease, Diabetes mellitus without complication (HCC), Diverticulosis, GERD (gastroesophageal reflux disease), Gout, Myocardial infarction (HCC), Obesity, Renal stone, and Seizures (HCC).   He has a past surgical history that includes Partial colectomy; Cardiac catheterization; throat biop; Appendectomy; Back surgery; Colonoscopy (2003); Colonoscopy with propofol (N/A, 08/07/2015); Esophagogastroduodenoscopy (egd) with propofol (N/A, 08/07/2015); Esophageal dilation (N/A, 08/07/2015); and biopsy (N/A, 08/07/2015).   His family history includes Other in an other family member.He reports that he has never smoked. He has quit using smokeless tobacco.  His smokeless tobacco use included chew. He reports that he does not drink alcohol and does not use drugs.  No current outpatient medications on file prior to visit.   No current facility-administered medications on file prior to visit.    ROS Review of Systems  Constitutional:  Negative for  fever.  Respiratory:  Negative for shortness of breath.   Cardiovascular:  Negative for chest pain.  Musculoskeletal:  Negative for arthralgias.  Skin:  Negative for rash.   Objective:  BP (!) 162/98   Pulse 76   Temp 97.8 F (36.6 C) (Temporal)   Resp 20   Ht 5\' 8"  (1.727 m)   Wt 246 lb (111.6 kg)   SpO2 97%   BMI 37.40 kg/m   BP Readings from Last 3 Encounters:  06/10/21 (!) 162/98  07/26/20 (!) 142/85  11/24/19 (!) 152/82    Wt Readings from Last 3 Encounters:  06/10/21 246 lb (111.6 kg)  07/26/20 254 lb (115.2 kg)  11/24/19 244 lb 14.9 oz (111.1 kg)     Physical Exam Vitals reviewed.  Constitutional:      Appearance: He is well-developed. He is obese.  HENT:     Head: Normocephalic and atraumatic.     Right Ear: External ear normal.     Left Ear: External ear normal.     Mouth/Throat:     Pharynx: No oropharyngeal exudate or posterior oropharyngeal erythema.  Eyes:     Pupils: Pupils are equal, round, and reactive to light.  Cardiovascular:     Rate and Rhythm: Normal rate and regular rhythm.     Heart sounds: No murmur heard. Pulmonary:     Effort: No respiratory distress.     Breath sounds: Normal breath sounds.  Abdominal:     General: There is no distension.     Tenderness: There is no abdominal tenderness.  Comments: Abdominal girth is 44-3/4 inches  Musculoskeletal:     Cervical back: Normal range of motion and neck supple.  Neurological:     Mental Status: He is alert and oriented to person, place, and time.    Results for orders placed or performed in visit on 06/10/21  Bayer DCA Hb A1c Waived  Result Value Ref Range   HB A1C (BAYER DCA - WAIVED) 6.6 <7.0 %     Assessment & Plan:   Hikeem was seen today for medical management of chronic issues.  Diagnoses and all orders for this visit:  Diabetes mellitus without complication (HCC) -     Bayer DCA Hb A1c Waived -     Microalbumin / creatinine urine ratio  Obesity (BMI  30-39.9)  Essential hypertension  Rather than putting patient on medication at this time he will continue diet control for the diabetes.  Weight management was recommended for the blood pressure.  He should be able to lose about 1/2 pound a week with a good low-carb diet and controlling portions.  He needs to add regular exercise to that.   I have asked him to monitor his blood pressure at home.  Follow a low salt diet.  His goal for his blood pressure should be below 130/80 at rest.     Follow-up: Return in about 6 months (around 12/08/2021).  Mechele Claude, M.D.

## 2021-06-11 LAB — MICROALBUMIN / CREATININE URINE RATIO
Creatinine, Urine: 148.6 mg/dL
Microalb/Creat Ratio: 17 mg/g creat (ref 0–29)
Microalbumin, Urine: 25 ug/mL

## 2021-06-12 ENCOUNTER — Telehealth: Payer: Self-pay | Admitting: Family Medicine

## 2021-06-12 NOTE — Telephone Encounter (Signed)
Patient saw Dr. Darlyn Read on 06/10/21 and was supposed to be prescribed something.  Was not sure which medicine he was calling in for his erection.  Viagra or something other.  Has not heard anything.  Please call.  [facility]:  [facility] on [address]

## 2021-06-12 NOTE — Telephone Encounter (Signed)
I do not see this mentioned in the note from the visit. Patient will need to wait until Dr. Darlyn Read is back next Tuesday as our office is closed on Monday.

## 2021-06-12 NOTE — Telephone Encounter (Signed)
Patient aware.

## 2021-06-15 ENCOUNTER — Other Ambulatory Visit: Payer: Self-pay | Admitting: Family Medicine

## 2021-06-15 MED ORDER — SILDENAFIL CITRATE 20 MG PO TABS: ORAL_TABLET | ORAL | 5 refills | Status: AC

## 2021-06-15 NOTE — Telephone Encounter (Signed)
Please let the patient know that I sent their prescription to their pharmacy. Thanks, WS 

## 2021-06-16 NOTE — Telephone Encounter (Signed)
Patient notified

## 2021-07-18 ENCOUNTER — Other Ambulatory Visit: Payer: Self-pay

## 2021-07-18 ENCOUNTER — Emergency Department (HOSPITAL_COMMUNITY)
Admission: EM | Admit: 2021-07-18 | Discharge: 2021-07-18 | Disposition: A | Discharge status: Home / Self Care | Payer: 59 | Attending: Emergency Medicine | Admitting: Emergency Medicine

## 2021-07-18 ENCOUNTER — Encounter (HOSPITAL_COMMUNITY): Payer: Self-pay

## 2021-07-18 DIAGNOSIS — N189 Chronic kidney disease, unspecified: Secondary | ICD-10-CM | POA: Diagnosis not present

## 2021-07-18 DIAGNOSIS — Z87891 Personal history of nicotine dependence: Secondary | ICD-10-CM | POA: Insufficient documentation

## 2021-07-18 DIAGNOSIS — E1122 Type 2 diabetes mellitus with diabetic chronic kidney disease: Secondary | ICD-10-CM | POA: Diagnosis not present

## 2021-07-18 DIAGNOSIS — M542 Cervicalgia: Secondary | ICD-10-CM | POA: Diagnosis present

## 2021-07-18 DIAGNOSIS — I2510 Atherosclerotic heart disease of native coronary artery without angina pectoris: Secondary | ICD-10-CM | POA: Insufficient documentation

## 2021-07-18 DIAGNOSIS — M436 Torticollis: Secondary | ICD-10-CM | POA: Diagnosis not present

## 2021-07-18 MED ORDER — OXYCODONE-ACETAMINOPHEN 5-325 MG PO TABS
1.0000 | ORAL_TABLET | Freq: Once | ORAL | Status: AC
  Administered 2021-07-18: 1 via ORAL
  Filled 2021-07-18: qty 1

## 2021-07-18 MED ORDER — DIAZEPAM 5 MG PO TABS
5.0000 mg | ORAL_TABLET | Freq: Once | ORAL | Status: AC
  Administered 2021-07-18: 5 mg via ORAL
  Filled 2021-07-18: qty 1

## 2021-07-18 MED ORDER — DIAZEPAM 5 MG PO TABS: 5.0000 mg | ORAL_TABLET | Freq: Three times a day (TID) | ORAL | 0 refills | Status: DC | PRN

## 2021-07-18 NOTE — ED Provider Notes (Signed)
[facility] EMERGENCY DEPARTMENT Provider Note   CSN: [id number] Arrival date & time: 07/18/21  0043     History Chief Complaint  Patient presents with   Neck Pain    John Wallace is a 60 y.o. male.  Patient presents to the emergency department for evaluation of neck pain.  Patient reports that pain started several days ago.  It worsened today and he was seen at urgent care.  He was prescribed prednisone and Flexeril.  Patient reports that tonight he could not sleep because the pain is worse.  He cannot turn his head from side to side because it is so stiff.  Pain does not radiate to the upper extremities.  No numbness, weakness of upper extremities.  He denies injury.      Past Medical History:  Diagnosis Date   Chronic kidney disease    Coronary artery disease    Diabetes mellitus without complication (HCC)    Diverticulosis    GERD (gastroesophageal reflux disease)    no medical treatment   Gout    Myocardial infarction (HCC)    mild Heart attack 2002   Obesity    Renal stone    passed    Seizures (HCC)    as a child    Patient Active Problem List   Diagnosis Date Noted   Head cold 11/17/2020   Chest congestion 11/17/2020   Pharyngitis 11/17/2020   Diabetes mellitus, new onset (HCC) 02/07/2019   Gout 02/10/2017   Mucosal abnormality of stomach    Hiatal hernia    Mucosal abnormality of colon    GERD (gastroesophageal reflux disease) 07/22/2015   Esophageal dysphagia 07/22/2015   Fatty liver 07/22/2015   Family history of colonic polyps 07/22/2015    Past Surgical History:  Procedure Laterality Date   APPENDECTOMY     BACK SURGERY     x5 surgeries   BIOPSY N/A 08/07/2015   Procedure: Gastric Biopsies;  Surgeon: Corbin Ade, MD;  Location: [facility];  Service: Endoscopy;  Laterality: N/A;   CARDIAC CATHETERIZATION     COLONOSCOPY  2003   RMR: Normal appearing colonic mucosa to 35-40 cm.  Spasm versus persistent swelling and noncompliance of the  colon along with patient's discomfort precluded completion of exam.   COLONOSCOPY WITH PROPOFOL N/A 08/07/2015   RMR: Status post segmental colonic resection. Focally abnormal appeearing colonic mucosa of doubtful clinical significance status post biopsy.    ESOPHAGEAL DILATION N/A 08/07/2015   Procedure: ESOPHAGEAL DILATION;  Surgeon: Corbin Ade, MD;  Location: [facility];  Service: Endoscopy;  Laterality: N/A;  #56 maloney dilator   ESOPHAGOGASTRODUODENOSCOPY (EGD) WITH PROPOFOL N/A 08/07/2015   RMR: Normal esophagus- status post maloney dilation. Hiatal hernia. Abnormal stomach as described status post biopsy.    PARTIAL COLECTOMY     Dr Bradford:diverticulosis   throat biop     benign       Family History  Problem Relation Age of Onset   Other Other        doesn't know family history    Social History   Tobacco Use   Smoking status: Never   Smokeless tobacco: Former    Types: Associate Professor Use: Never used  Substance Use Topics   Alcohol use: No    Alcohol/week: 0.0 standard drinks    Comment: none since 2013, not regular drinker before but would occasional binging   Drug use: No    Home Medications Prior to  Admission medications   Medication Sig Start Date End Date Taking? Authorizing Provider  diazepam (VALIUM) 5 MG tablet Take 1 tablet (5 mg total) by mouth every 8 (eight) hours as needed for muscle spasms. 07/18/21  Yes Favour Aleshire, Canary Brim, MD  sildenafil (REVATIO) 20 MG tablet Take 2-5 pills at once, orally, with each sexual encounter 06/15/21   Mechele Claude, MD    Allergies    Patient has no known allergies.  Review of Systems   Review of Systems  Musculoskeletal:  Positive for neck pain.  All other systems reviewed and are negative.  Physical Exam Updated Vital Signs BP (!) 157/92   Pulse 66   Temp 97.8 F (36.6 C) (Oral)   Resp 19   Ht 5\' 8"  (1.727 m)   Wt 114.8 kg   SpO2 96%   BMI 38.47 kg/m   Physical Exam Vitals and nursing  note reviewed.  Constitutional:      General: He is not in acute distress.    Appearance: Normal appearance. He is well-developed.  HENT:     Head: Normocephalic and atraumatic.     Right Ear: Hearing normal.     Left Ear: Hearing normal.     Nose: Nose normal.  Eyes:     Conjunctiva/sclera: Conjunctivae normal.     Pupils: Pupils are equal, round, and reactive to light.  Cardiovascular:     Rate and Rhythm: Regular rhythm.     Heart sounds: S1 normal and S2 normal. No murmur heard.   No friction rub. No gallop.  Pulmonary:     Effort: Pulmonary effort is normal. No respiratory distress.     Breath sounds: Normal breath sounds.  Chest:     Chest wall: No tenderness.  Abdominal:     General: Bowel sounds are normal.     Palpations: Abdomen is soft.     Tenderness: There is no abdominal tenderness. There is no guarding or rebound. Negative signs include Murphy's sign and McBurney's sign.     Hernia: No hernia is present.  Musculoskeletal:     Cervical back: Neck supple. Torticollis present. Muscular tenderness present. No spinous process tenderness. Decreased range of motion.  Skin:    General: Skin is warm and dry.     Findings: No rash.  Neurological:     Mental Status: He is alert and oriented to person, place, and time.     GCS: GCS eye subscore is 4. GCS verbal subscore is 5. GCS motor subscore is 6.     Cranial Nerves: No cranial nerve deficit.     Sensory: No sensory deficit.     Coordination: Coordination normal.  Psychiatric:        Speech: Speech normal.        Behavior: Behavior normal.        Thought Content: Thought content normal.    ED Results / Procedures / Treatments   Labs (all labs ordered are listed, but only abnormal results are displayed) Labs Reviewed - No data to display  EKG None  Radiology No results found.  Procedures Procedures   Medications Ordered in ED Medications  diazepam (VALIUM) tablet 5 mg (5 mg Oral Given 07/18/21 0148)   oxyCODONE-acetaminophen (PERCOCET/ROXICET) 5-325 MG per tablet 1 tablet (1 tablet Oral Given 07/18/21 0148)    ED Course  I have reviewed the triage vital signs and the nursing notes.  Pertinent labs & imaging results that were available during my care of the patient were reviewed  by me and considered in my medical decision making (see chart for details).    MDM Rules/Calculators/A&P                           Patient presents to the emergency department for evaluation of neck pain.  Patient has severe muscle spasms of the neck consistent with acute nontraumatic torticollis.  Patient has normal neurologic examination of the extremities.  He does not have any indication for imaging at this time.  He is afebrile, no evidence of meningismus.  Patient treated with analgesia and Valium, has improved. Final Clinical Impression(s) / ED Diagnoses Final diagnoses:  Torticollis, acute    Rx / DC Orders ED Discharge Orders          Ordered    diazepam (VALIUM) 5 MG tablet  Every 8 hours PRN        07/18/21 0235             Gilda Crease, MD 07/18/21 [phone]

## 2021-07-18 NOTE — ED Triage Notes (Signed)
Pt reports neck pain that started Monday, pt seen at urgent care yesterday-dx with torticollis- rx prednisone and flexeril. Pt took one flexeril has not started prednisone yet, says pain is still there and worse.

## 2021-12-08 ENCOUNTER — Ambulatory Visit: Payer: 59 | Admitting: Family Medicine

## 2021-12-23 ENCOUNTER — Ambulatory Visit: Payer: 59 | Admitting: Family Medicine

## 2021-12-28 ENCOUNTER — Encounter: Payer: Self-pay | Admitting: Family Medicine

## 2022-04-15 ENCOUNTER — Encounter (HOSPITAL_COMMUNITY): Payer: Self-pay

## 2022-04-15 ENCOUNTER — Other Ambulatory Visit: Payer: Self-pay

## 2022-04-15 DIAGNOSIS — R252 Cramp and spasm: Secondary | ICD-10-CM | POA: Insufficient documentation

## 2022-04-15 DIAGNOSIS — M545 Low back pain, unspecified: Secondary | ICD-10-CM | POA: Insufficient documentation

## 2022-04-15 DIAGNOSIS — R350 Frequency of micturition: Secondary | ICD-10-CM | POA: Insufficient documentation

## 2022-04-15 DIAGNOSIS — Z5321 Procedure and treatment not carried out due to patient leaving prior to being seen by health care provider: Secondary | ICD-10-CM | POA: Insufficient documentation

## 2022-04-15 LAB — URINALYSIS, ROUTINE W REFLEX MICROSCOPIC
Bilirubin Urine: NEGATIVE
Glucose, UA: NEGATIVE mg/dL
Hgb urine dipstick: NEGATIVE
Ketones, ur: NEGATIVE mg/dL
Leukocytes,Ua: NEGATIVE
Nitrite: NEGATIVE
Protein, ur: NEGATIVE mg/dL
Specific Gravity, Urine: 1.026 (ref 1.005–1.030)
pH: 5 (ref 5.0–8.0)

## 2022-04-15 NOTE — ED Triage Notes (Signed)
Pt stepped in a hole 2 days ago and started having lower back pain. Pt states it intermittently spasms causing his legs to go out. Pt also reports urinary frequency.

## 2022-04-16 ENCOUNTER — Emergency Department (HOSPITAL_COMMUNITY)
Admission: EM | Admit: 2022-04-16 | Discharge: 2022-04-16 | Discharge status: Against Medical Advice | Payer: 59 | Attending: Emergency Medicine | Admitting: Emergency Medicine

## 2022-04-19 ENCOUNTER — Other Ambulatory Visit: Payer: Self-pay

## 2022-04-19 ENCOUNTER — Emergency Department (HOSPITAL_BASED_OUTPATIENT_CLINIC_OR_DEPARTMENT_OTHER): Payer: Self-pay | Admitting: Radiology

## 2022-04-19 ENCOUNTER — Encounter (HOSPITAL_BASED_OUTPATIENT_CLINIC_OR_DEPARTMENT_OTHER): Payer: Self-pay | Admitting: Emergency Medicine

## 2022-04-19 ENCOUNTER — Emergency Department (HOSPITAL_BASED_OUTPATIENT_CLINIC_OR_DEPARTMENT_OTHER)
Admission: EM | Admit: 2022-04-19 | Discharge: 2022-04-19 | Disposition: A | Discharge status: Home / Self Care | Payer: Self-pay | Attending: Emergency Medicine | Admitting: Emergency Medicine

## 2022-04-19 DIAGNOSIS — R350 Frequency of micturition: Secondary | ICD-10-CM | POA: Insufficient documentation

## 2022-04-19 DIAGNOSIS — M545 Low back pain, unspecified: Secondary | ICD-10-CM | POA: Insufficient documentation

## 2022-04-19 LAB — URINALYSIS, ROUTINE W REFLEX MICROSCOPIC
Bilirubin Urine: NEGATIVE
Glucose, UA: NEGATIVE mg/dL
Hgb urine dipstick: NEGATIVE
Ketones, ur: NEGATIVE mg/dL
Leukocytes,Ua: NEGATIVE
Nitrite: NEGATIVE
Specific Gravity, Urine: 1.035 — ABNORMAL HIGH (ref 1.005–1.030)
pH: 5 (ref 5.0–8.0)

## 2022-04-19 MED ORDER — KETOROLAC TROMETHAMINE 15 MG/ML IJ SOLN
15.0000 mg | Freq: Once | INTRAMUSCULAR | Status: AC
  Administered 2022-04-19: 15 mg via INTRAMUSCULAR
  Filled 2022-04-19: qty 1

## 2022-04-19 MED ORDER — OXYCODONE-ACETAMINOPHEN 5-325 MG PO TABS: 1.0000 | ORAL_TABLET | Freq: Four times a day (QID) | ORAL | 0 refills | Status: DC | PRN

## 2022-04-19 MED ORDER — HYDROCODONE-ACETAMINOPHEN 5-325 MG PO TABS
2.0000 | ORAL_TABLET | Freq: Once | ORAL | Status: AC
  Administered 2022-04-19: 2 via ORAL
  Filled 2022-04-19: qty 2

## 2022-04-19 MED ORDER — LIDOCAINE 4 % EX PTCH: 1.0000 | MEDICATED_PATCH | CUTANEOUS | 0 refills | Status: AC

## 2022-04-19 MED ORDER — NAPROXEN 500 MG PO TABS: 500.0000 mg | ORAL_TABLET | Freq: Two times a day (BID) | ORAL | 0 refills | Status: AC

## 2022-04-19 NOTE — ED Triage Notes (Signed)
Patient c/o left lower back pain since Tuesday, states he has spasms that cause his knees to buckle. Patient does report when the pain hits that he has to urinate.

## 2022-04-19 NOTE — ED Provider Notes (Signed)
Patient signed out she is pending urinalysis.  This is unremarkable.  Patient's pain appears well controlled here with medications provided.  Recommending outpatient follow-up with his doctor within the next 2 to 3 days, recommending rest ice medication at home.  Advised immediate return for worsening symptoms or any additional concerns.   Cheryll Cockayne, MD 04/19/22 2213

## 2022-04-19 NOTE — ED Provider Notes (Signed)
[facility] EMERGENCY DEPT Provider Note   CSN: [id number] Arrival date & time: 04/19/22  1832     History Chief Complaint  Patient presents with   Back Pain    John Wallace is a 61 y.o. male who presents to the emergency department today with a 2-week history of left lower back pain.  Patient states that he was helping his son build a playset when he stepped in a hole causing him to twist.  Since then he has been having constant waxing and waning spasms of left lower back.  He does endorse associated urinary frequency but denies any bowel or bladder incontinence.  No focal weakness of the lower extremities.  No numbness to the lower extremities.  Patient does have a history of kidney stones and states this feels different.   Back Pain      Home Medications Prior to Admission medications   Medication Sig Start Date End Date Taking? Authorizing Provider  diazepam (VALIUM) 5 MG tablet Take 1 tablet (5 mg total) by mouth every 8 (eight) hours as needed for muscle spasms. 07/18/21   Gilda Crease, MD  sildenafil (REVATIO) 20 MG tablet Take 2-5 pills at once, orally, with each sexual encounter 06/15/21   Mechele Claude, MD      Allergies    Patient has no known allergies.    Review of Systems   Review of Systems  Musculoskeletal:  Positive for back pain.  All other systems reviewed and are negative.   Physical Exam Updated Vital Signs BP (!) 152/98 (BP Location: Left Arm)   Pulse 64   Temp (!) 97.5 F (36.4 C) (Oral)   Resp 20   Ht 5\' 8"  (1.727 m)   Wt 109.8 kg   SpO2 97%   BMI 36.80 kg/m  Physical Exam Vitals and nursing note reviewed.  Constitutional:      General: He is not in acute distress.    Appearance: Normal appearance.  HENT:     Head: Normocephalic and atraumatic.  Eyes:     General:        Right eye: No discharge.        Left eye: No discharge.  Cardiovascular:     Comments: Regular rate and rhythm.  S1/S2 are distinct  without any evidence of murmur, rubs, or gallops.  Radial pulses are 2+ bilaterally.  Dorsalis pedis pulses are 2+ bilaterally.  No evidence of pedal edema. Pulmonary:     Comments: Clear to auscultation bilaterally.  Normal effort.  No respiratory distress.  No evidence of wheezes, rales, or rhonchi heard throughout. Abdominal:     General: Abdomen is flat. Bowel sounds are normal. There is no distension.     Tenderness: There is no abdominal tenderness. There is no guarding or rebound.  Musculoskeletal:        General: Normal range of motion.     Cervical back: Neck supple.  Skin:    General: Skin is warm and dry.     Findings: No rash.  Neurological:     General: No focal deficit present.     Mental Status: He is alert.  Psychiatric:        Mood and Affect: Mood normal.        Behavior: Behavior normal.     ED Results / Procedures / Treatments   Labs (all labs ordered are listed, but only abnormal results are displayed) Labs Reviewed  URINALYSIS, ROUTINE W REFLEX MICROSCOPIC    EKG  None  Radiology No results found.  Procedures Procedures    Medications Ordered in ED Medications  HYDROcodone-acetaminophen (NORCO/VICODIN) 5-325 MG per tablet 2 tablet (has no administration in time range)  ketorolac (TORADOL) 15 MG/ML injection 15 mg (has no administration in time range)    ED Course/ Medical Decision Making/ A&P Clinical Course as of 04/19/22 2153  Mon Apr 19, 2022  2152 DG Lumbar Spine Complete I personally reviewed this image and do not see any evidence of acute fracture. I agree with the radiologist interpretation.  [CF]    Clinical Course User Index [CF] Teressa Lower, PA-C                           Medical Decision Making John Wallace is a 61 y.o. male patient who presents to the emergency department today for further evaluation of left lower back pain.  Differential diagnosis includes kidney stone although less likely as he states this feels  similar and there is no specific tenderness.  This could also be muscular spasm.  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  We will get imaging of the lumbar spine and UA to further evaluate.  We will give him pain medication and plan to reassess.   On reevaluation, patient is feeling better from a pain perspective.  Just provided urine sample.  If urine is negative he can go home with muscle relaxers and anti-inflammatories.  If urine is positive, will consider adding on antibiotics to cover for UTI.  Dr. Audley Hose to follow-up.      Amount and/or Complexity of Data Reviewed Labs: ordered. Radiology: ordered.  Risk Prescription drug management.    Final Clinical Impression(s) / ED Diagnoses Final diagnoses:  None    Rx / DC Orders ED Discharge Orders     None         Jolyn Lent 04/19/22 2154    Cheryll Cockayne, MD 04/19/22 2212

## 2022-04-19 NOTE — Discharge Instructions (Signed)
Your x-ray and urine tests were normal.  Take the medications as written, follow-up with your primary care doctor within the week, return back to the ER if you have worsening symptoms worsening pain or any additional concerns.

## 2022-08-13 ENCOUNTER — Other Ambulatory Visit: Payer: Self-pay | Admitting: Family Medicine

## 2023-01-08 ENCOUNTER — Emergency Department (HOSPITAL_BASED_OUTPATIENT_CLINIC_OR_DEPARTMENT_OTHER)
Admission: EM | Admit: 2023-01-08 | Discharge: 2023-01-08 | Disposition: A | Discharge status: Home / Self Care | Payer: Self-pay | Attending: Emergency Medicine | Admitting: Emergency Medicine

## 2023-01-08 ENCOUNTER — Encounter (HOSPITAL_BASED_OUTPATIENT_CLINIC_OR_DEPARTMENT_OTHER): Payer: Self-pay | Admitting: Emergency Medicine

## 2023-01-08 DIAGNOSIS — M62830 Muscle spasm of back: Secondary | ICD-10-CM | POA: Insufficient documentation

## 2023-01-08 MED ORDER — ACETAMINOPHEN 500 MG PO TABS: 500.0000 mg | ORAL_TABLET | Freq: Four times a day (QID) | ORAL | 0 refills | Status: AC | PRN

## 2023-01-08 MED ORDER — NAPROXEN 500 MG PO TABS: 500.0000 mg | ORAL_TABLET | Freq: Two times a day (BID) | ORAL | 0 refills | Status: DC

## 2023-01-08 MED ORDER — METHOCARBAMOL 500 MG PO TABS
750.0000 mg | ORAL_TABLET | Freq: Once | ORAL | Status: AC
  Administered 2023-01-08: 750 mg via ORAL
  Filled 2023-01-08: qty 2

## 2023-01-08 MED ORDER — LIDOCAINE 5 % EX PTCH
1.0000 | MEDICATED_PATCH | CUTANEOUS | Status: DC
  Administered 2023-01-08: 1 via TRANSDERMAL
  Filled 2023-01-08: qty 1

## 2023-01-08 MED ORDER — NAPROXEN 250 MG PO TABS
500.0000 mg | ORAL_TABLET | Freq: Once | ORAL | Status: AC
  Administered 2023-01-08: 500 mg via ORAL
  Filled 2023-01-08: qty 2

## 2023-01-08 MED ORDER — LIDOCAINE 5 % EX PTCH: 1.0000 | MEDICATED_PATCH | CUTANEOUS | 0 refills | Status: DC

## 2023-01-08 MED ORDER — METHOCARBAMOL 500 MG PO TABS: 500.0000 mg | ORAL_TABLET | Freq: Two times a day (BID) | ORAL | 0 refills | Status: AC

## 2023-01-08 NOTE — ED Triage Notes (Signed)
Back pain started a few days ago. Sneezed and felt a sharp pain from lower back into left hip

## 2023-01-08 NOTE — ED Provider Notes (Signed)
[name] Note   CSN: [id number] Arrival date & time: 01/08/23  [id number]     History  Chief Complaint  Patient presents with   Back Pain    John Wallace is a 62 y.o. male presenting today due to 3 days worth of left-sided back pain.  He reports it started when he sneezed but that it would go away once he got up and moving throughout the day.  He said that he sneezed again today and felt a sharp pain and it has been present ever since.  Says is worse with moving and lifting.  Is able to reproduce it on palpation.  No difficulty urinating, dysuria or hematuria.  No history of kidney stone   Back Pain      Home Medications Prior to Admission medications   Medication Sig Start Date End Date Taking? Authorizing Provider  acetaminophen (TYLENOL) 500 MG tablet Take 1 tablet (500 mg total) by mouth every 6 (six) hours as needed. 01/08/23  Yes Deziray Nabi A, PA-C  lidocaine (LIDODERM) 5 % Place 1 patch onto the skin daily. Remove & Discard patch within 12 hours or as directed by MD 01/08/23  Yes Treyvion Durkee A, PA-C  methocarbamol (ROBAXIN) 500 MG tablet Take 1 tablet (500 mg total) by mouth 2 (two) times daily. 01/08/23  Yes Alvey Brockel A, PA-C  naproxen (NAPROSYN) 500 MG tablet Take 1 tablet (500 mg total) by mouth 2 (two) times daily. 01/08/23  Yes Daimian Sudberry A, PA-C  diazepam (VALIUM) 5 MG tablet Take 1 tablet (5 mg total) by mouth every 8 (eight) hours as needed for muscle spasms. 07/18/21   John Greek, MD  oxyCODONE-acetaminophen (PERCOCET/ROXICET) 5-325 MG tablet Take 1 tablet by mouth every 6 (six) hours as needed for up to 8 doses for severe pain. 04/19/22   Luna Fuse, MD  sildenafil (REVATIO) 20 MG tablet Take 2-5 pills at once, orally, with each sexual encounter 06/15/21   Claretta Fraise, MD      Allergies    Patient has no known allergies.    Review of Systems   Review of Systems   Musculoskeletal:  Positive for back pain.    Physical Exam Updated Vital Signs BP (!) 162/90   Pulse 70   Temp 98.7 F (37.1 C)   Resp 18   Ht 5\' 8"  (1.727 m)   Wt 106.6 kg   SpO2 96%   BMI 35.73 kg/m  Physical Exam Vitals and nursing note reviewed.  Constitutional:      Appearance: Normal appearance.  HENT:     Head: Normocephalic and atraumatic.  Eyes:     General: No scleral icterus.    Conjunctiva/sclera: Conjunctivae normal.  Pulmonary:     Effort: Pulmonary effort is normal. No respiratory distress.  Abdominal:     Tenderness: There is no right CVA tenderness or left CVA tenderness.  Musculoskeletal:     Comments: Palpable muscle spasm at the L3 left paraspinal.  No midline tenderness.  No overlying skin changes.  Ambulatory in the room  Skin:    General: Skin is warm and dry.     Findings: No rash.  Neurological:     Mental Status: He is alert.  Psychiatric:        Mood and Affect: Mood normal.     ED Results / Procedures / Treatments   Labs (all labs ordered are listed, but only abnormal results are displayed) Labs  Reviewed - No data to display  EKG None  Radiology No results found.  Procedures Procedures   Medications Ordered in ED Medications  lidocaine (LIDODERM) 5 % 1 patch (1 patch Transdermal Patch Applied 01/08/23 2045)  naproxen (NAPROSYN) tablet 500 mg (500 mg Oral Given 01/08/23 2043)  methocarbamol (ROBAXIN) tablet 750 mg (750 mg Oral Given 01/08/23 2043)    ED Course/ Medical Decision Making/ A&P                             Medical Decision Making Risk OTC drugs. Prescription drug management.   62 year old male presenting with back pain.  The emergent differential diagnosis for back pain includes but is not limited to fracture, muscle strain, cauda equina, spinal stenosis. DDD, ankylosing spondylitis, acute ligamentous injury, disk herniation, spondylolisthesis, Epidural compression syndrome, metastatic cancer, transverse  myelitis, vertebral osteomyelitis, diskitis, kidney stone, pyelonephritis, AAA, Perforated ulcer, Retrocecal appendicitis, pancreatitis, bowel obstruction, retroperitoneal hemorrhage or mass, meningitis.  Physical exam: Negative CVA tenderness.  Reproducible back pain and muscle spasm.  No deformities, crepitus or step-offs.  No midline tenderness  Treatment: Lidocaine patch, naproxen and Robaxin  Imaging: Considered however no trauma that makes me concerned for finding on x-ray or CT.  Patient has no concerning history or physical exam findings that warrant MRI  MDM/disposition: 62 year old male who presented today with back pain.  Has been ongoing for 3 days.  Worse with sneezing, movement, lifting but is better when he warms up his muscles and ambulates throughout the day.  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  Considered kidney stone however patient has no hematuria, difficulty urinating, history of same or CVA tenderness.  Also considered spinal cord compression however patient denied any saddle anesthesia, bowel/bladder dysfunction, leg weakness or falls.  Also no history of cancer that makes me suspicious for any bone metastases.  Believe his presentation is most consistent with muscle strain and spasm.  He will be treated with NSAIDs, Tylenol, muscle relaxants and Lidoderm patches.  He does not have health insurance so he will be referred to outpatient health clinics that may be able to help him if this pain becomes chronic.  He is agreeable to discharge at this time   Final Clinical Impression(s) / ED Diagnoses Final diagnoses:  Back muscle spasm    Rx / DC Orders ED Discharge Orders          Ordered    methocarbamol (ROBAXIN) 500 MG tablet  2 times daily        01/08/23 2029    lidocaine (LIDODERM) 5 %  Every 24 hours        01/08/23 2029    naproxen (NAPROSYN) 500 MG tablet  2 times daily        01/08/23 2029    acetaminophen (TYLENOL) 500 MG  tablet  Every 6 hours PRN        01/08/23 2029           Results and diagnoses were explained to the patient. Return precautions discussed in full. Patient had no additional questions and expressed complete understanding.   This chart was dictated using voice recognition software.  Despite best efforts to proofread,  errors can occur which can change the documentation meaning.     Darliss Ridgel 01/08/23 2108    Tretha Sciara, MD 01/09/23 [phone]

## 2023-01-08 NOTE — ED Notes (Signed)
Pt verbalized understanding of d/c instructions, meds, and followup care. Denies questions. VSS, no distress noted. Steady gait to exit with all belongings.  ?

## 2023-01-08 NOTE — Discharge Instructions (Addendum)
As we discussed, it appears as though you are having a muscle spasm on the left side of your back.  I have sent the following medications to your pharmacy  Ibuprofen.  This may be used for pain and inflammation Acetaminophen.  This may be alternated with ibuprofen for pain Methocarbamol.  This is a muscle relaxant.  Only use as needed and remember it may make you drowsy so do not drive on this medication.  Additionally do not drink alcohol with this.  Stop taking it if it makes you dizzy. Lidocaine patches.  As we discussed these should only be worn for around 12 hours at a time.  You must have 12 hours patch free  Please follow-up with a health clinic if you continue to have pain despite the above medications and heat packs.  Do not hesitate to return with any worsening symptoms, especially difficulty urinating or having bowel movements, numbness in your pelvic area, leg weakness or falls, fevers or blood in your urine

## 2023-12-28 ENCOUNTER — Emergency Department (HOSPITAL_COMMUNITY): Payer: Self-pay

## 2023-12-28 ENCOUNTER — Encounter (HOSPITAL_COMMUNITY): Payer: Self-pay

## 2023-12-28 ENCOUNTER — Emergency Department (HOSPITAL_COMMUNITY)
Admission: EM | Admit: 2023-12-28 | Discharge: 2023-12-28 | Disposition: A | Discharge status: Home / Self Care | Payer: Self-pay | Attending: Emergency Medicine | Admitting: Emergency Medicine

## 2023-12-28 ENCOUNTER — Other Ambulatory Visit: Payer: Self-pay

## 2023-12-28 DIAGNOSIS — M25511 Pain in right shoulder: Secondary | ICD-10-CM | POA: Insufficient documentation

## 2023-12-28 MED ORDER — NAPROXEN 500 MG PO TABS: 500.0000 mg | ORAL_TABLET | Freq: Two times a day (BID) | ORAL | 0 refills | Status: DC

## 2023-12-28 MED ORDER — OXYCODONE-ACETAMINOPHEN 5-325 MG PO TABS
1.0000 | ORAL_TABLET | Freq: Once | ORAL | Status: AC
  Administered 2023-12-28: 1 via ORAL
  Filled 2023-12-28: qty 1

## 2023-12-28 MED ORDER — OXYCODONE-ACETAMINOPHEN 5-325 MG PO TABS: 1.0000 | ORAL_TABLET | Freq: Four times a day (QID) | ORAL | 0 refills | Status: AC | PRN

## 2023-12-28 NOTE — ED Triage Notes (Signed)
 Pov from home. Right arm pain for x1 week. Hurts worse with movement.

## 2023-12-28 NOTE — ED Provider Notes (Signed)
 [place] EMERGENCY DEPARTMENT AT [facility] Provider Note   CSN: [id number] Arrival date & time: 12/28/23  2055     History {Add pertinent medical, surgical, social history, OB history to HPI:1} Chief Complaint  Patient presents with   Arm Pain    John Wallace is a 63 y.o. male.  Patient with right shoulder pain.  He is a Curator   Arm Pain       Home Medications Prior to Admission medications   Medication Sig Start Date End Date Taking? Authorizing Provider  naproxen (NAPROSYN) 500 MG tablet Take 1 tablet (500 mg total) by mouth 2 (two) times daily. 12/28/23  Yes Bethann Berkshire, MD  oxyCODONE-acetaminophen (PERCOCET/ROXICET) 5-325 MG tablet Take 1 tablet by mouth every 6 (six) hours as needed for severe pain (pain score 7-10). 12/28/23  Yes Bethann Berkshire, MD  acetaminophen (TYLENOL) 500 MG tablet Take 1 tablet (500 mg total) by mouth every 6 (six) hours as needed. 01/08/23   Redwine, Madison A, PA-C  diazepam (VALIUM) 5 MG tablet Take 1 tablet (5 mg total) by mouth every 8 (eight) hours as needed for muscle spasms. 07/18/21   Gilda Crease, MD  lidocaine (LIDODERM) 5 % Place 1 patch onto the skin daily. Remove & Discard patch within 12 hours or as directed by MD 01/08/23   Redwine, Madison A, PA-C  methocarbamol (ROBAXIN) 500 MG tablet Take 1 tablet (500 mg total) by mouth 2 (two) times daily. 01/08/23   Redwine, Madison A, PA-C  sildenafil (REVATIO) 20 MG tablet Take 2-5 pills at once, orally, with each sexual encounter 06/15/21   Mechele Claude, MD      Allergies    Patient has no known allergies.    Review of Systems   Review of Systems  Physical Exam Updated Vital Signs BP (!) 168/100 (BP Location: Left Arm)   Pulse 74   Temp 98.1 F (36.7 C) (Oral)   Resp 18   Ht 5\' 8"  (1.727 m)   Wt 106.6 kg   SpO2 95%   BMI 35.73 kg/m  Physical Exam  ED Results / Procedures / Treatments   Labs (all labs ordered are listed, but only abnormal  results are displayed) Labs Reviewed - No data to display  EKG None  Radiology DG Shoulder Right Result Date: 12/28/2023 CLINICAL DATA:  Right shoulder pain EXAM: RIGHT SHOULDER - 2+ VIEW COMPARISON:  None Available. FINDINGS: Degenerative changes in the [facility] joint with joint space narrowing and spurring. Glenohumeral joint is maintained. No acute bony abnormality. Specifically, no fracture, subluxation, or dislocation. Soft tissues are intact. 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 No acute bony abnormality. Electronically Signed   By: Charlett Nose M.D.   On: 12/28/2023 22:01    Procedures Procedures  {Document cardiac monitor, telemetry assessment procedure when appropriate:1}  Medications Ordered in ED Medications  oxyCODONE-acetaminophen (PERCOCET/ROXICET) 5-325 MG per tablet 1 tablet (has no administration in time range)    ED Course/ Medical Decision Making/ A&P   {   Click here for ABCD2, HEART and other calculatorsREFRESH Note before signing :1}                              Medical Decision Making Amount and/or Complexity of Data Reviewed Radiology: ordered.  Risk Prescription drug management.   Patient with degenerative changes in his right [facility] joint causing significant pain.  He is placed on  Naprosyn and Percocet and will follow-up with Ortho  {Document critical care time when appropriate:1} {Document review of labs and clinical decision tools ie heart score, Chads2Vasc2 etc:1}  {Document your independent review of radiology images, and any outside records:1} {Document your discussion with family members, caretakers, and with consultants:1} {Document social determinants of health affecting pt's care:1} {Document your decision making why or why not admission, treatments were needed:1} Final Clinical Impression(s) / ED Diagnoses Final diagnoses:  Acute pain of right shoulder    Rx / DC Orders ED Discharge Orders          Ordered     naproxen (NAPROSYN) 500 MG tablet  2 times daily        12/28/23 2255    oxyCODONE-acetaminophen (PERCOCET/ROXICET) 5-325 MG tablet  Every 6 hours PRN        12/28/23 2255

## 2023-12-28 NOTE — Discharge Instructions (Addendum)
 Limited use of right arm until you see Dr. Romeo Apple or one of his colleagues within a week.  Call tomorrow for an appointment

## 2024-11-14 ENCOUNTER — Encounter (HOSPITAL_BASED_OUTPATIENT_CLINIC_OR_DEPARTMENT_OTHER): Payer: Self-pay

## 2024-11-14 ENCOUNTER — Emergency Department (HOSPITAL_BASED_OUTPATIENT_CLINIC_OR_DEPARTMENT_OTHER): Payer: Self-pay | Admitting: Radiology

## 2024-11-14 ENCOUNTER — Emergency Department (HOSPITAL_BASED_OUTPATIENT_CLINIC_OR_DEPARTMENT_OTHER)
Admission: EM | Admit: 2024-11-14 | Discharge: 2024-11-14 | Disposition: A | Discharge status: Home / Self Care | Payer: Self-pay | Attending: Emergency Medicine | Admitting: Emergency Medicine

## 2024-11-14 DIAGNOSIS — I2510 Atherosclerotic heart disease of native coronary artery without angina pectoris: Secondary | ICD-10-CM | POA: Insufficient documentation

## 2024-11-14 DIAGNOSIS — E1122 Type 2 diabetes mellitus with diabetic chronic kidney disease: Secondary | ICD-10-CM | POA: Insufficient documentation

## 2024-11-14 DIAGNOSIS — X58XXXA Exposure to other specified factors, initial encounter: Secondary | ICD-10-CM | POA: Insufficient documentation

## 2024-11-14 DIAGNOSIS — S46812A Strain of other muscles, fascia and tendons at shoulder and upper arm level, left arm, initial encounter: Secondary | ICD-10-CM | POA: Insufficient documentation

## 2024-11-14 DIAGNOSIS — I129 Hypertensive chronic kidney disease with stage 1 through stage 4 chronic kidney disease, or unspecified chronic kidney disease: Secondary | ICD-10-CM | POA: Insufficient documentation

## 2024-11-14 DIAGNOSIS — N189 Chronic kidney disease, unspecified: Secondary | ICD-10-CM | POA: Insufficient documentation

## 2024-11-14 MED ORDER — DIAZEPAM 5 MG PO TABS: 5.0000 mg | ORAL_TABLET | Freq: Three times a day (TID) | ORAL | 0 refills | Status: AC | PRN

## 2024-11-14 MED ORDER — DIAZEPAM 5 MG PO TABS
5.0000 mg | ORAL_TABLET | Freq: Once | ORAL | Status: AC
  Administered 2024-11-14: 5 mg via ORAL
  Filled 2024-11-14: qty 1

## 2024-11-14 MED ORDER — NAPROXEN 500 MG PO TABS: 500.0000 mg | ORAL_TABLET | Freq: Two times a day (BID) | ORAL | 0 refills | Status: AC

## 2024-11-14 MED ORDER — CYCLOBENZAPRINE HCL 10 MG PO TABS: 10.0000 mg | ORAL_TABLET | Freq: Once | ORAL | Status: DC

## 2024-11-14 MED ORDER — LIDOCAINE 5 % EX PTCH: 1.0000 | MEDICATED_PATCH | CUTANEOUS | 0 refills | Status: AC

## 2024-11-14 MED ORDER — LIDOCAINE 5 % EX PTCH
1.0000 | MEDICATED_PATCH | CUTANEOUS | Status: DC
  Administered 2024-11-14: 1 via TRANSDERMAL
  Filled 2024-11-14: qty 1

## 2024-11-14 MED ORDER — KETOROLAC TROMETHAMINE 30 MG/ML IJ SOLN
30.0000 mg | Freq: Once | INTRAMUSCULAR | Status: AC
  Administered 2024-11-14: 30 mg via INTRAMUSCULAR
  Filled 2024-11-14: qty 1

## 2024-11-14 NOTE — ED Triage Notes (Addendum)
 Patient reports left sided shoulder pain over the past two weeks. Said it started as neck pain and has progressed. Denies chest pain, or injury. Says he has been using ice, heat, icy hot.

## 2024-11-14 NOTE — Discharge Instructions (Signed)
 Thank for letting us  evaluate you today.  I have given you a dose of Valium , Toradol  shot, lidocaine  patch for muscle strain.  Have also sent naproxen , lidocaine  patch, Valium  to your pharmacy for continued pain relief.  If you do not feel any better, please follow-up with orthopedic for further management  Return to ED if you experience worsening pain, inability to move neck, numbness or weakness in one-sided body, worsening symptoms

## 2024-11-14 NOTE — ED Provider Notes (Signed)
 " [place] EMERGENCY DEPARTMENT AT [facility] Provider Note   CSN: [id number] Arrival date & time: 11/14/24  8164     Patient presents with: Shoulder Pain   John Wallace is a 64 y.o. male.  2 wks ago woke up with neck pain - tylenol  resolved it R shoulder, neck Heat, ice, icy hot, massage  Worsening over past two days. Couldn't sleep last night  {Add pertinent medical, surgical, social history, OB history to HPI:32947}  Shoulder Pain      Prior to Admission medications  Medication Sig Start Date End Date Taking? Authorizing Provider  acetaminophen  (TYLENOL ) 500 MG tablet Take 1 tablet (500 mg total) by mouth every 6 (six) hours as needed. 01/08/23   Redwine, Madison A, PA-C  diazepam  (VALIUM ) 5 MG tablet Take 1 tablet (5 mg total) by mouth every 8 (eight) hours as needed for muscle spasms. 07/18/21   Haze Lonni PARAS, MD  lidocaine  (LIDODERM ) 5 % Place 1 patch onto the skin daily. Remove & Discard patch within 12 hours or as directed by MD 01/08/23   Redwine, Madison A, PA-C  methocarbamol  (ROBAXIN ) 500 MG tablet Take 1 tablet (500 mg total) by mouth 2 (two) times daily. 01/08/23   Redwine, Madison A, PA-C  naproxen  (NAPROSYN ) 500 MG tablet Take 1 tablet (500 mg total) by mouth 2 (two) times daily. 12/28/23   Zammit, Joseph, MD  oxyCODONE -acetaminophen  (PERCOCET/ROXICET) 5-325 MG tablet Take 1 tablet by mouth every 6 (six) hours as needed for severe pain (pain score 7-10). 12/28/23   Suzette Pac, MD  sildenafil  (REVATIO ) 20 MG tablet Take 2-5 pills at once, orally, with each sexual encounter 06/15/21   Zollie Lowers, MD    Allergies: Patient has no known allergies.    Review of Systems  Updated Vital Signs BP (!) 176/97 (BP Location: Right Arm)   Pulse 70   Temp 99.1 F (37.3 C)   Resp 18   SpO2 94%   Physical Exam  (all labs ordered are listed, but only abnormal results are displayed) Labs Reviewed - No data to display  EKG: None  Radiology: DG  Shoulder Left Result Date: 11/14/2024 CLINICAL DATA:  Shoulder pain.  No known injury. EXAM: LEFT SHOULDER - 2+ VIEW COMPARISON:  Remote shoulder radiograph 10/23/2008 FINDINGS: There is no evidence of fracture or dislocation. Acromioclavicular degenerative spurring with inferior osteophytes. Small subacromial spur. No erosive or bony destructive change. Soft tissues are unremarkable. IMPRESSION: Acromioclavicular degenerative spurring with inferior osteophytes. Small subacromial spur. Electronically Signed   By: Andrea Gasman M.D.   On: 11/14/2024 19:06    {Document cardiac monitor, telemetry assessment procedure when appropriate:32947} Procedures   Medications Ordered in the ED - No data to display    {Click here for ABCD2, HEART and other calculators REFRESH Note before signing:1}                              Medical Decision Making Amount and/or Complexity of Data Reviewed Radiology: ordered.  Risk Prescription drug management.   ***  {Document critical care time when appropriate  Document review of labs and clinical decision tools ie CHADS2VASC2, etc  Document your independent review of radiology images and any outside records  Document your discussion with family members, caretakers and with consultants  Document social determinants of health affecting pt's care  Document your decision making why or why not admission, treatments were needed:32947:::1}   Final diagnoses:  None    ED Discharge Orders     None        "
